# Patient Record
Sex: Female | Born: 1950
Health system: Southern US, Community
[De-identification: ages and names within clinical notes are randomized; demographics above are authoritative.]

## PROBLEM LIST (undated history)

## (undated) DIAGNOSIS — K08109 Complete loss of teeth, unspecified cause, unspecified class: Secondary | ICD-10-CM

## (undated) DIAGNOSIS — D696 Thrombocytopenia, unspecified: Secondary | ICD-10-CM

## (undated) DIAGNOSIS — R233 Spontaneous ecchymoses: Secondary | ICD-10-CM

## (undated) DIAGNOSIS — I10 Essential (primary) hypertension: Secondary | ICD-10-CM

## (undated) DIAGNOSIS — F32A Depression, unspecified: Secondary | ICD-10-CM

## (undated) DIAGNOSIS — N289 Disorder of kidney and ureter, unspecified: Secondary | ICD-10-CM

## (undated) DIAGNOSIS — F1020 Alcohol dependence, uncomplicated: Secondary | ICD-10-CM

## (undated) DIAGNOSIS — R609 Edema, unspecified: Secondary | ICD-10-CM

## (undated) DIAGNOSIS — R238 Other skin changes: Secondary | ICD-10-CM

## (undated) DIAGNOSIS — K7460 Unspecified cirrhosis of liver: Secondary | ICD-10-CM

## (undated) DIAGNOSIS — I499 Cardiac arrhythmia, unspecified: Secondary | ICD-10-CM

## (undated) DIAGNOSIS — F329 Major depressive disorder, single episode, unspecified: Secondary | ICD-10-CM

## (undated) DIAGNOSIS — K7010 Alcoholic hepatitis without ascites: Secondary | ICD-10-CM

## (undated) DIAGNOSIS — M1990 Unspecified osteoarthritis, unspecified site: Secondary | ICD-10-CM

## (undated) DIAGNOSIS — K766 Portal hypertension: Secondary | ICD-10-CM

## (undated) DIAGNOSIS — F419 Anxiety disorder, unspecified: Secondary | ICD-10-CM

## (undated) DIAGNOSIS — I509 Heart failure, unspecified: Secondary | ICD-10-CM

## (undated) HISTORY — PX: BREAST SURGERY: SHX581

## (undated) HISTORY — PX: ABDOMINAL HYSTERECTOMY: SHX81

## (undated) HISTORY — PX: KIDNEY STONE SURGERY: SHX686

---
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
## 2001-12-15 ENCOUNTER — Encounter: Payer: Self-pay | Admitting: Emergency Medicine

## 2001-12-15 ENCOUNTER — Inpatient Hospital Stay (HOSPITAL_COMMUNITY): Admission: EM | Admit: 2001-12-15 | Discharge: 2001-12-18 | Payer: Self-pay | Admitting: Emergency Medicine

## 2001-12-15 IMAGING — CT CT ABDOMEN W/O CM
1 series · 15 of 32 positions shown, 19 images · non-contrast
Comparison: none

FINDINGS
CLINICAL DATA: 51-YEAR-OLD WITH LEFT FLANK PAIN AND HEMATURIA.
CT ABDOMEN WITHOUT CONTRAST
HELICAL CT EXAMINATION OF THE ABDOMEN AND PELVIS PERFORMED WITHOUT IV OR ORAL CONTRAST TO
SPECIFICALLY EVALUATE FOR RENAL/URETERAL CALCULI /OBSTRUCTION.
THE LUNG BASES ARE CLEAR.
THE VISUALIZED UNENHANCED APPEARANCE OF THE LIVER AND SPLEEN ARE UNREMARKABLE.  THE PANCREAS
APPEARS NORMAL.  SMALL BOWEL AND COLON ARE GROSSLY NORMAL WITHOUT CONTRAST.  NO MESENTERIC
RETROPERITONEAL MASSES OR ADENOPATHY.  THE ADRENAL GLANDS ARE NORMAL.  THE RIGHT KIDNEY
DEMONSTRATES TWO RENAL CALCULI BUT NO EVIDENCE FOR AN OBSTRUCTIVE PROCESS.  THE LEFT KIDNEY
DEMONSTRATES HYDRONEPHROSIS AND PERINEPHRIC SIGNS OF OBSTRUCTION.  THERE IS ALSO MILD DILATATION OF
THE LEFT URETER BUT NO UPPER URETERAL CALCULI ARE SEEN.
THE AORTA IS NORMAL IN CALIBER.
THE GALLBLADDER APPEARS NORMAL.
IMPRESSION
1.  RIGHT RENAL CALCULI.
2. OBSTRUCTIVE PROCESS  LEFT KIDNEY WITH HYDROURETERONEPHROSIS.
CT PELVIS WITHOUT CONTRAST
THERE IS A 4.25 MM LEFT DISTAL URETERAL CALCULUS CAUSING THE LEFT-SIDED HYDROURETERONEPHROSIS.  THE
PATIENT HAS HAD A HYSTERECTOMY.  BOTH OVARIES ARE STILL PRESENT AND ARE UNREMARKABLE BY CT.  THERE
IS A FOLEY CATHETER IN THE BLADDER.  NO DEFINITE BLADDER CALCULI ARE SEEN.
NO PELVIC MASSES, ADENOPATHY OR FREE PELVIC FLUID COLLECTIONS.
4.25 MM LEFT DISTAL URETERAL CALCULUS CAUSING MODERATE GRADE OBSTRUCTION.

[Series 8022: — · axial · 0.76mm/px · z∈[+1346,+1666]mm · 15 of 72 slices shown, 19 images]
[im 5/72  soft-tissue]
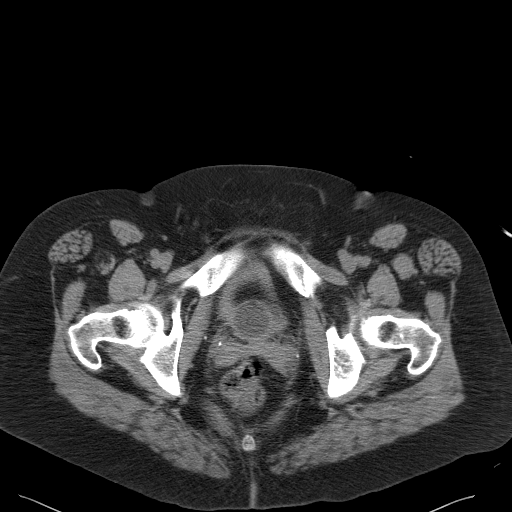
[im 5/72  bone]
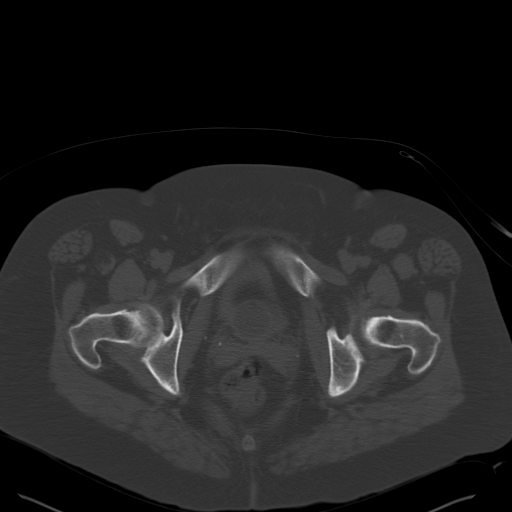
[im 10/72  soft-tissue]
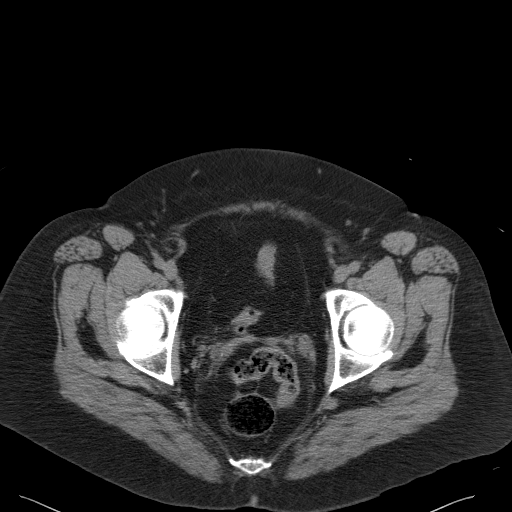
[im 14/72  soft-tissue]
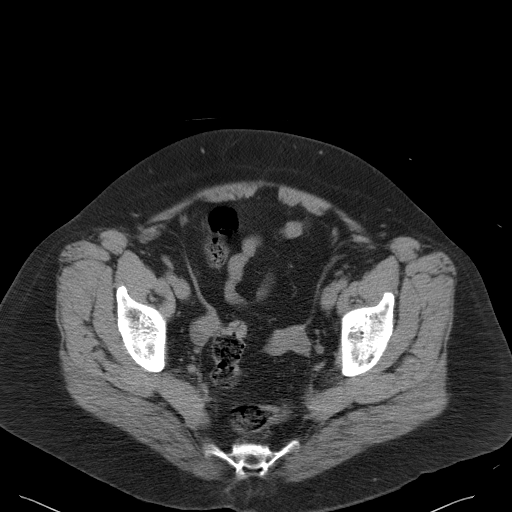
[im 21/72  soft-tissue]
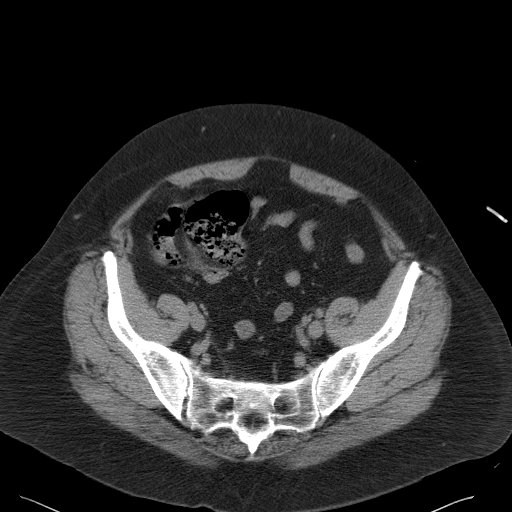
[im 26/72  soft-tissue]
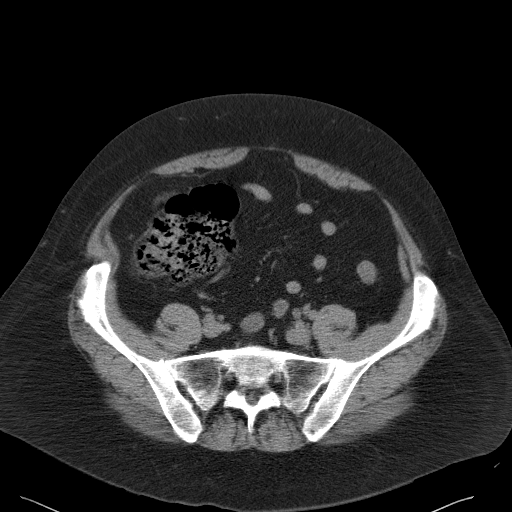
[im 30/72  soft-tissue]
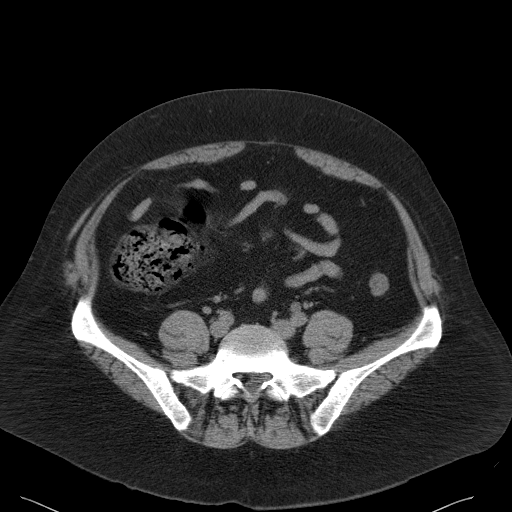
[im 37/72  soft-tissue]
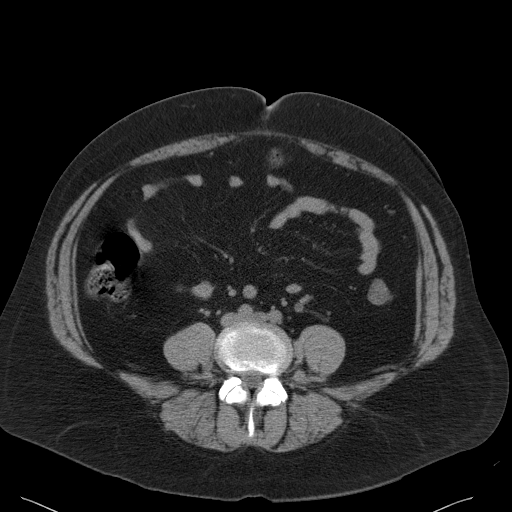
[im 42/72  soft-tissue]
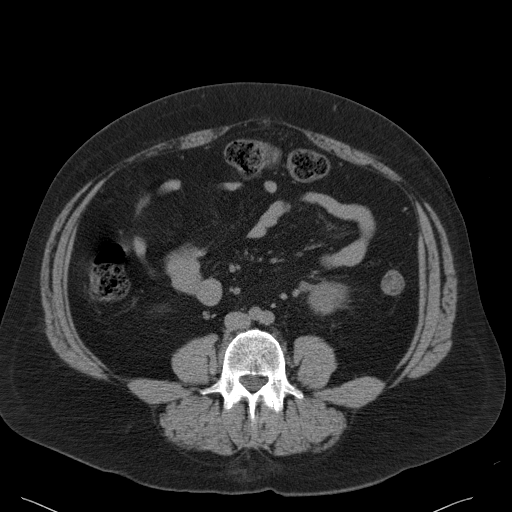
[im 46/72  soft-tissue]
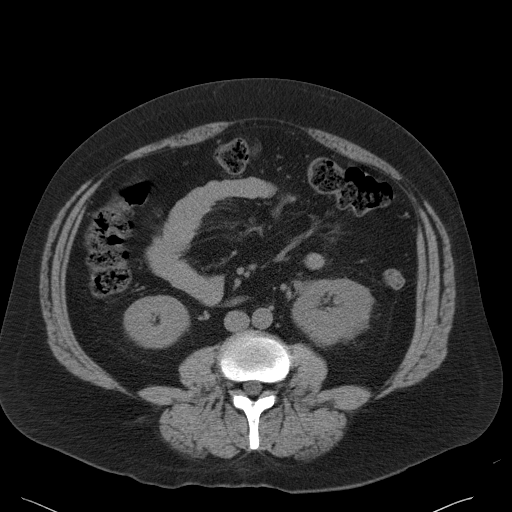
[im 46/72  bone]
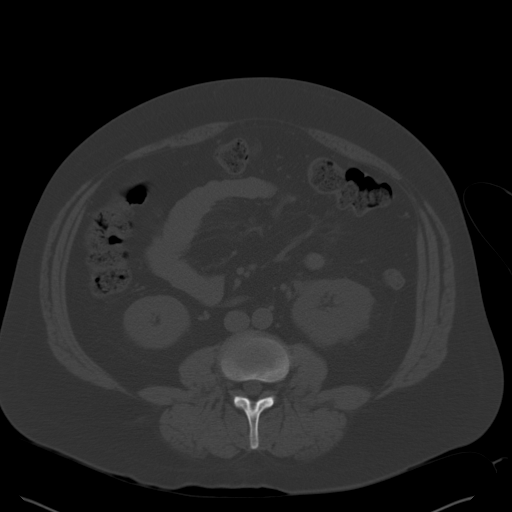
[im 51/72  soft-tissue]
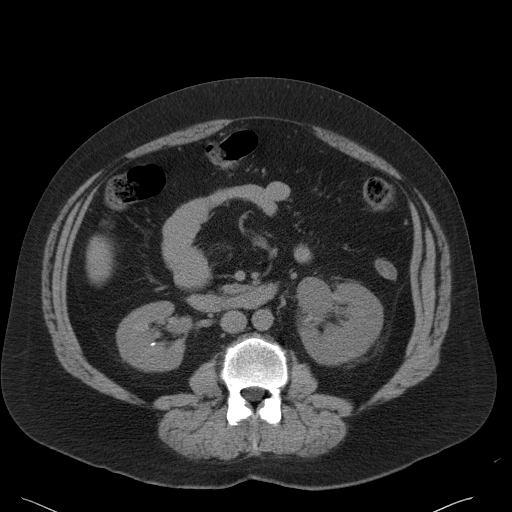
[im 58/72  soft-tissue]
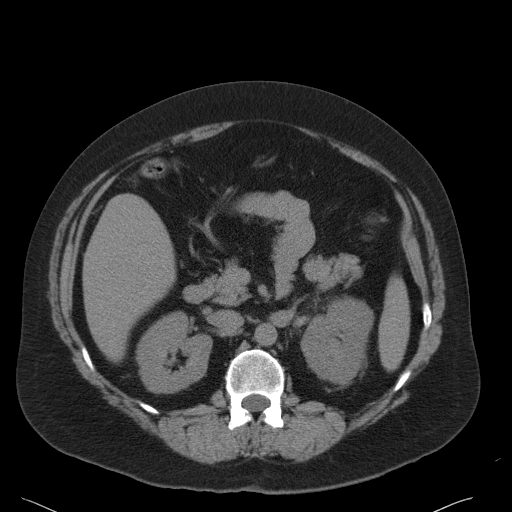
[im 62/72  soft-tissue]
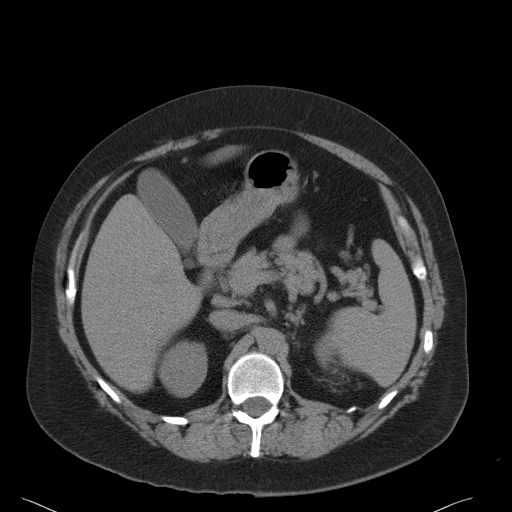
[im 62/72  lung]
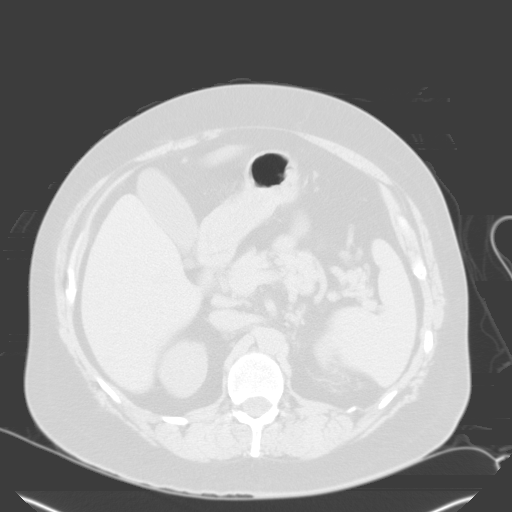
[im 65/72  lung]
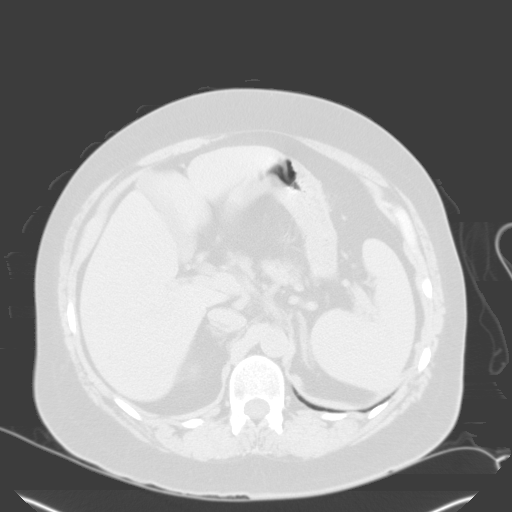
[im 67/72  soft-tissue]
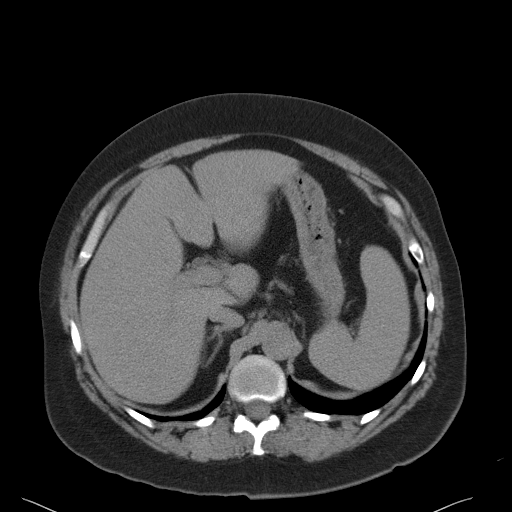
[im 67/72  lung]
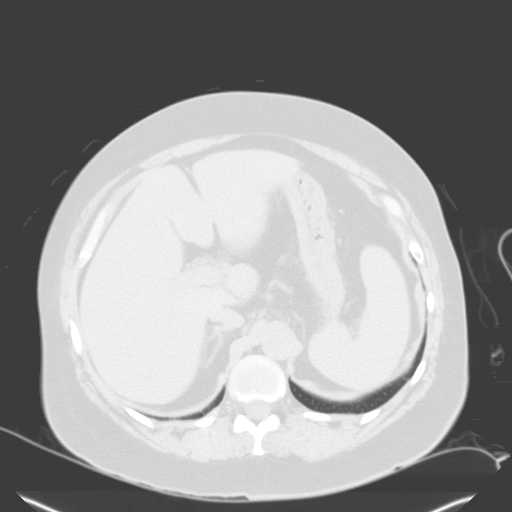
[im 69/72  lung]
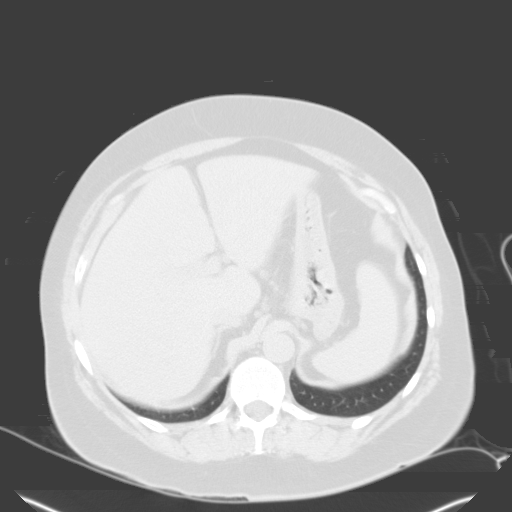

[15 of 32 positions shown; findings below may reference images not displayed]

## 2001-12-18 ENCOUNTER — Encounter: Payer: Self-pay | Admitting: Urology

## 2001-12-20 ENCOUNTER — Emergency Department (HOSPITAL_COMMUNITY): Admission: EM | Admit: 2001-12-20 | Discharge: 2001-12-20 | Payer: Self-pay | Admitting: Emergency Medicine

## 2009-10-26 ENCOUNTER — Emergency Department (HOSPITAL_COMMUNITY): Admission: EM | Admit: 2009-10-26 | Discharge: 2009-10-26 | Payer: Self-pay | Admitting: Emergency Medicine

## 2009-10-26 IMAGING — CR DG WRIST COMPLETE 3+V*R*
4 series · 4 of 4 positions shown · non-contrast
Comparison: None.

CLINICAL DATA: Fall.  Wrist injury.  Pain and bruising.

RIGHT WRIST - COMPLETE 3+ VIEW

[view not recorded (1 of 4)]
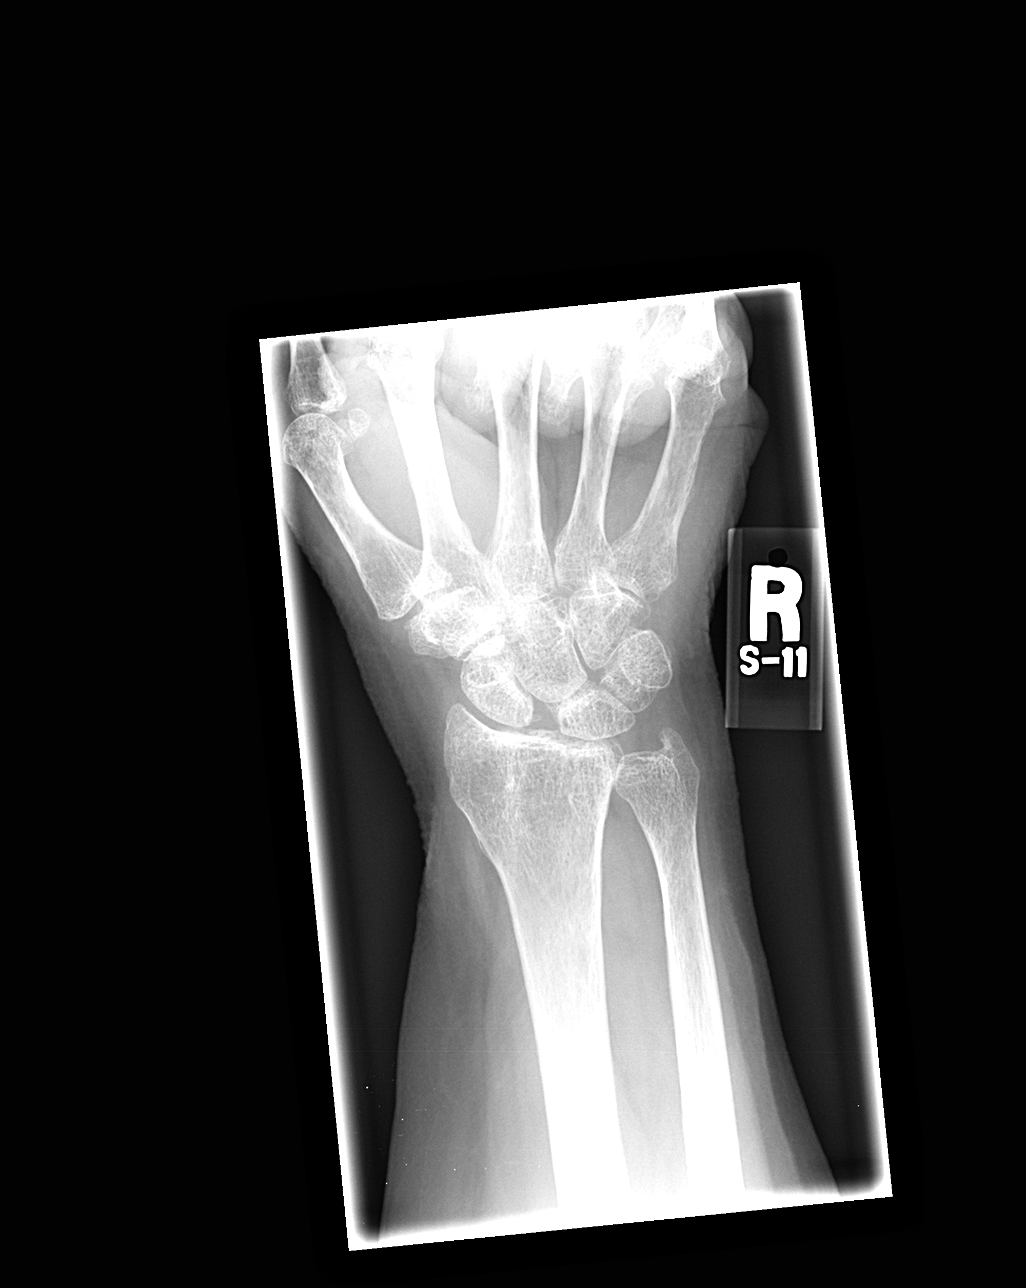

[view not recorded (2 of 4)]
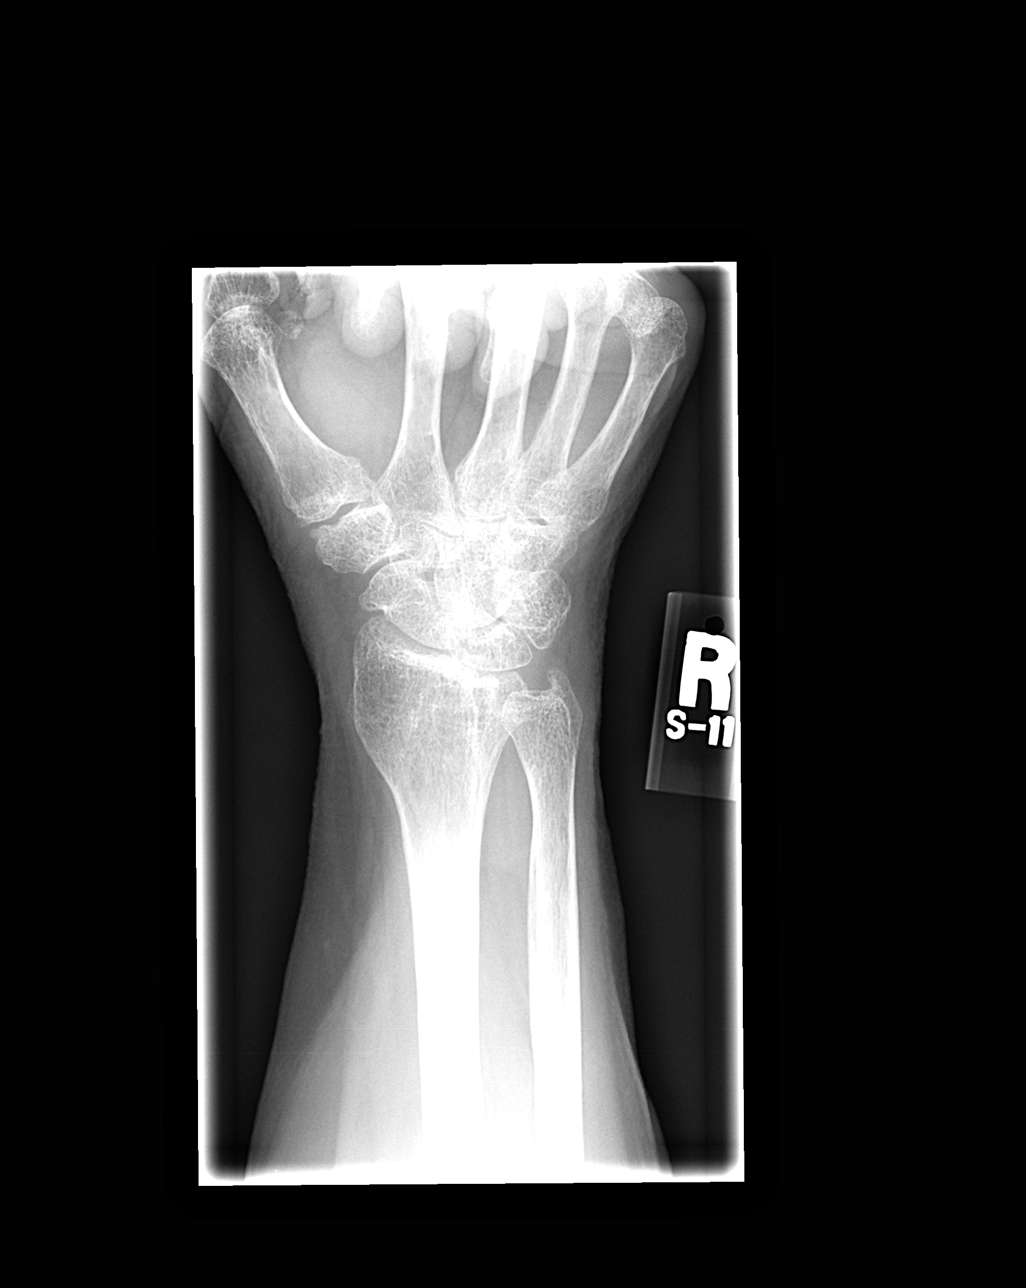

[view not recorded (3 of 4)]
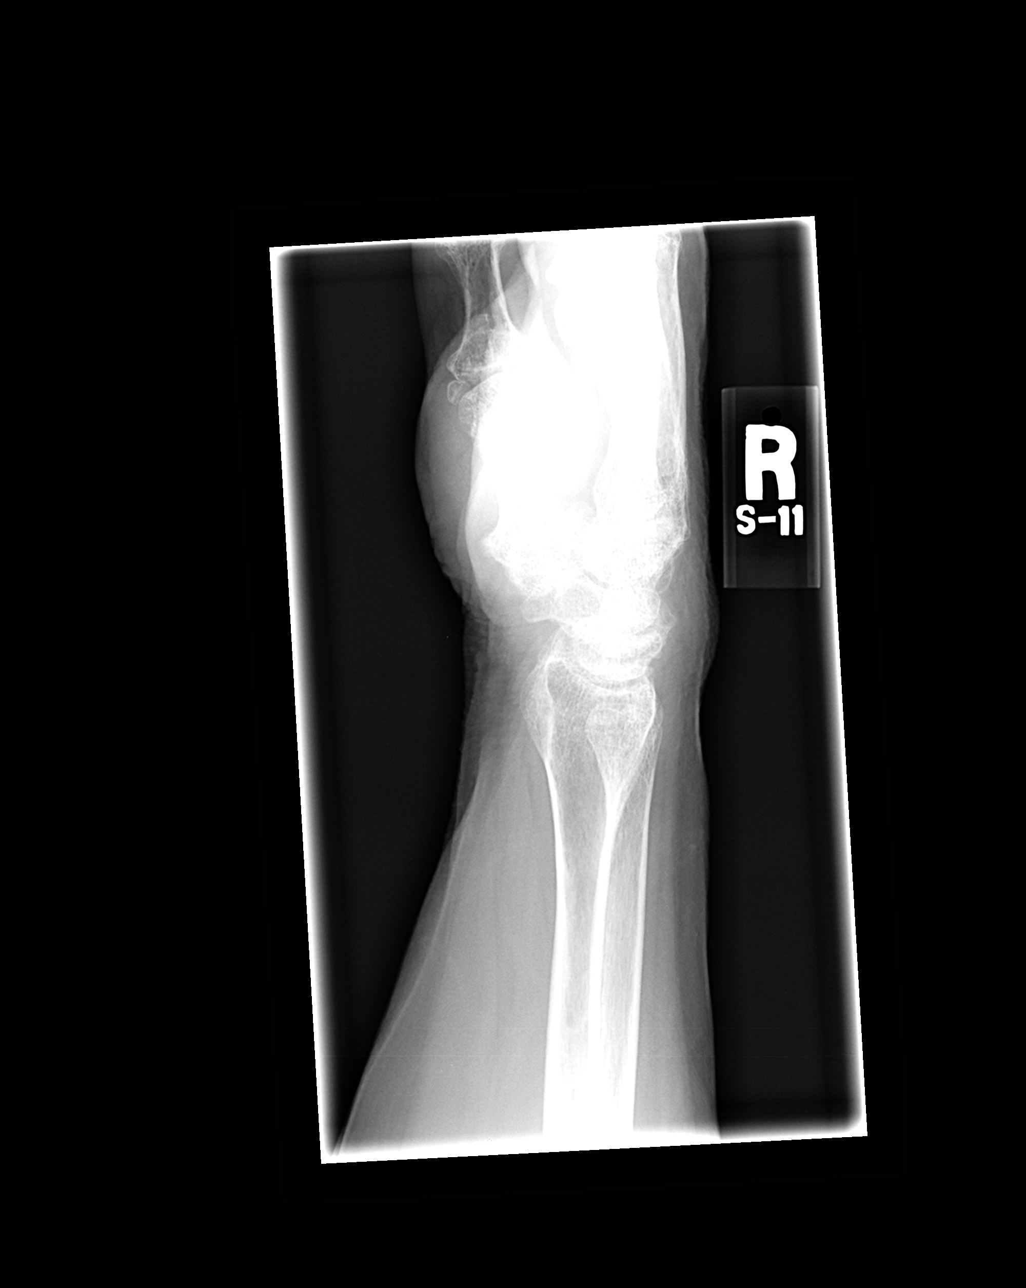

[view not recorded (4 of 4)]
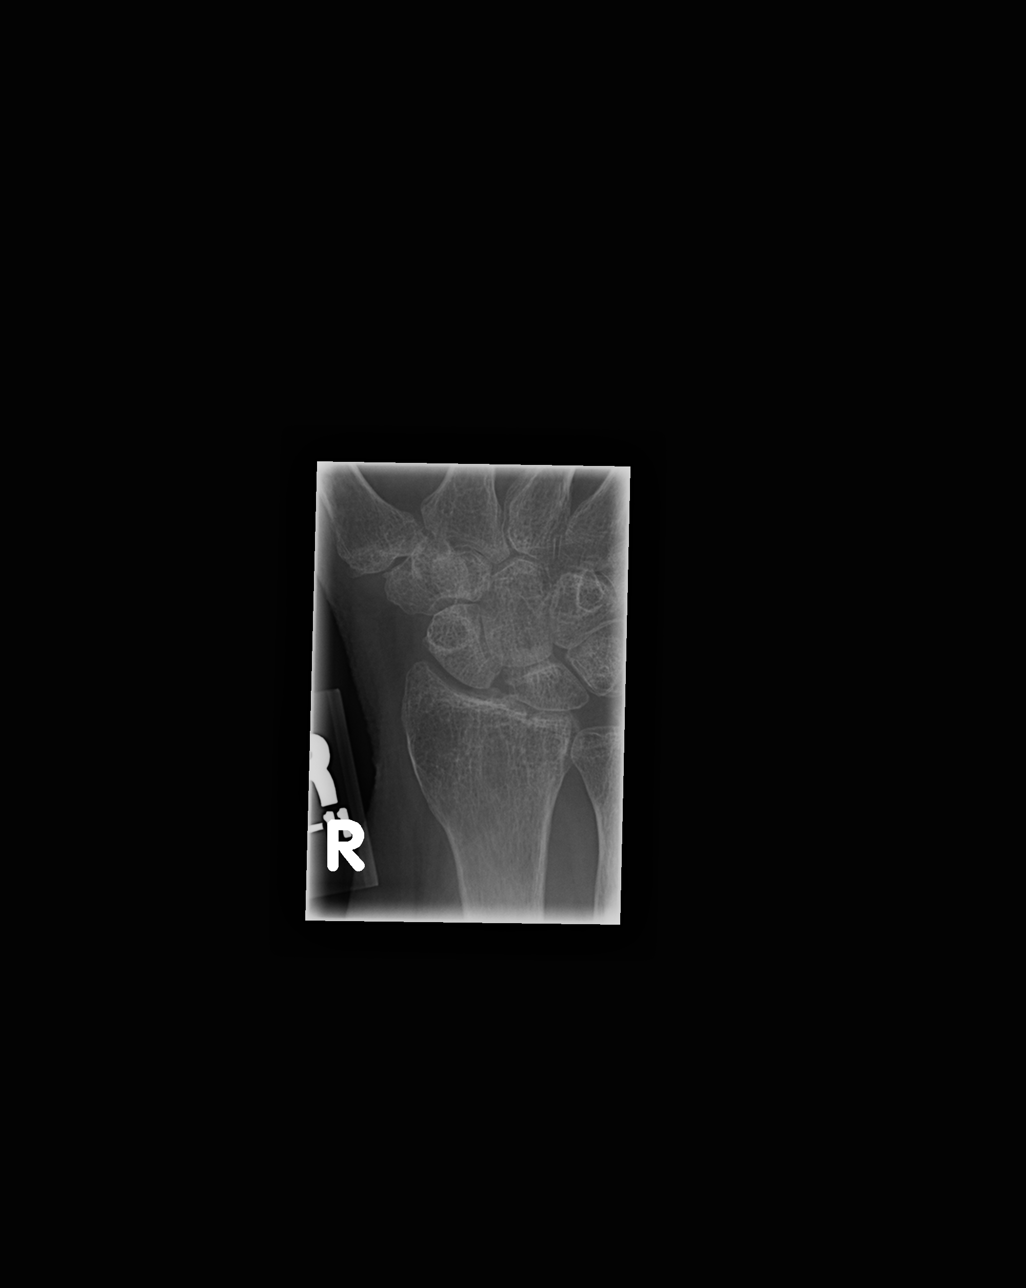

[4 of 4 positions shown; findings below may reference images not displayed]

FINDINGS: No evidence of fracture or dislocation.  Generalized
osteopenia noted.  Widening of the scapholunate joint space seen,
consistent with scapholunate ligament tear, which is of
indeterminate age.
IMPRESSION: 1.  No evidence of fracture or dislocation.
2.  Scapholunate ligament tear, which is of indeterminate age.
3.  Diffuse osteopenia.

## 2010-11-02 NOTE — H&P (Signed)
[facility]  Patient:    Alyssa Pena, Alyssa Pena Visit Number: [id number] MRN: [id number]          Service Type: MED Location: 3A B284 01 Attending Physician:  Annamarie Dawley Dictated by:   Alleen Borne, M.D. Admit Date:  12/15/2001                           History and Physical  CHIEF COMPLAINT:  Difficulty to void, left flank pain.  HISTORY OF PRESENT ILLNESS:  This is a 60 year old female who presented to the emergency room with severe pain on the left side radiating downwards and forward, and then having difficulty to void.  Foley catheter was inserted.  A CT scan was done.  It showed that she had a 2 x 4 mm stone in the left ureterovesical junction.  The patient was still having considerable pain, so it was decided to admit her into the hospital.  She was also having nausea and vomiting.  No fever, chills, or having similar problems in the past.  PAST MEDICAL HISTORY:  History of having bronchial asthma.  No diabetes or hypertension.  FAMILY HISTORY:  No history of kidney stones.  SOCIAL HISTORY:  Does not smoke or drink.  REVIEW OF SYSTEMS:  Unremarkable.  PHYSICAL EXAMINATION:  GENERAL:  Morbidly obese female not in acute distress.  Fully conscious, alert and oriented.  VITAL SIGNS:  Blood pressure is 140/90, temperature is normal.  CENTRAL NERVOUS SYSTEM:  No gross neurological deficit.  HEAD AND NECK:  ENT negative.  CHEST:  Symmetrical.  HEART:  Regular sinus rhythm.  ABDOMEN:  Soft and flat.  Liver, spleen, and kidneys are not palpable.  Mild tenderness, left severe.  PELVIC EXAMINATION:  Deferred.  EXTREMITIES:  Normal.  IMPRESSION:  Left ureteral calculus.  PLAN:  IV fluids, parenteral analgesia. Dictated by:   Alleen Borne, M.D. Attending Physician:  Annamarie Dawley DD:  12/15/01 TD:  12/17/01 Job: [id number]

## 2010-11-02 NOTE — Op Note (Signed)
   NAME:  Alyssa Pena, Alyssa Pena                           ACCOUNT NO.:  [id number]   MEDICAL RECORD NO.:  [id number]                   PATIENT TYPE:  INP   LOCATION:  A337                                 FACILITY:  [facility]   PHYSICIAN:  Ky Barban, M.D.            DATE OF BIRTH:  [birth year]   DATE OF PROCEDURE:  12/18/2001  DATE OF DISCHARGE:  12/18/2001                                 OPERATIVE REPORT   PREOPERATIVE DIAGNOSIS:  Left ureteral calculus.   POSTOPERATIVE DIAGNOSIS:  Left ureteral calculus.   PROCEDURE:  1. Cystoscopy.  2. Left retrograde pyelogram.  3. Balloon dilation of ureters.  4. Stone basket extraction.   SURGEON:  Ky Barban, M.D.   ANESTHESIA:  General   DESCRIPTION OF PROCEDURE:  The patient was given general endotracheal  anesthesia and placed in the lithotomy position after sterile prep and  drape.  A #25 cystoscope was introduced into the bladder.  The left ureteral  orifice catheterized with a wedge catheter.  Hypaque is injected and a stone  is seen in the left ureterovesical junction.   At this point a guidewire is passed up into the renal pelvis.  Over the  guidewire a #15 balloon dilator is positioned in the intramural ureter.  The  ureter is dilated, now the balloon is removed along with the cystoscope  leaving the guidewire in.  Along side the guidewire a short rigid  ureteroscope is passed to the level of the stone.  The stone is visualized  and it was engaged in the basket, and removed under direct vision without  any complications.   The guidewire and instruments were removed.  No complications.  The patient  left the operating room in satisfactory condition.                                               Ky Barban, M.D.    MIJ/MEDQ  D:  02/20/2002  T:  02/20/2002  Job:  [id number]

## 2010-11-02 NOTE — Discharge Summary (Signed)
   NAMEMARIELYS, TRINIDAD                             ACCOUNT NO.:  [id number]   MEDICAL RECORD NO.:  [id number]                  PATIENT TYPE:   LOCATION:                                       FACILITY:   PHYSICIAN:  Ky Barban, M.D.            DATE OF BIRTH:   DATE OF ADMISSION:  12/15/2001  DATE OF DISCHARGE:  12/18/2001                                 DISCHARGE SUMMARY   HISTORY OF PRESENT ILLNESS:  A 60 year old female was admitted with left  renal colic.  CT scan showed a stone in the left ureter in the  ureterovesical junction.  The stone is about 5 mm in size.  She is admitted  to the hospital for further management and work-up.   HOSPITAL COURSE:  On routine admission work-up CBC, urinalysis and Chem-7  done, which are normal.  She was started on IV fluids and parenteral  analgesia.  She continued to complain of pain and wanted me to go ahead and  remove the stone so I advised her to undergo stone basket procedure.  Its  complications were discussed.  She was taken to the operating room on July 4  and the stone basket was done.  Stone was removed.  No complications so the  same evening the patient is feeling well so I discharged her home.  Will  follow her in the office.   FINAL DISCHARGE DIAGNOSES:  Left ureter calculus.   CONDITION ON DISCHARGE:  Improved.   DISCHARGE MEDICATIONS:  None.                                               Ky Barban, M.D.    MIJ/MEDQ  D:  02/20/2002  T:  02/22/2002  Job:  [id number]

## 2012-06-06 ENCOUNTER — Emergency Department (HOSPITAL_COMMUNITY)
Admission: EM | Admit: 2012-06-06 | Discharge: 2012-06-06 | Disposition: A | Discharge status: Home / Self Care | Payer: Self-pay | Attending: Emergency Medicine | Admitting: Emergency Medicine

## 2012-06-06 ENCOUNTER — Encounter (HOSPITAL_COMMUNITY): Payer: Self-pay | Admitting: *Deleted

## 2012-06-06 DIAGNOSIS — Z862 Personal history of diseases of the blood and blood-forming organs and certain disorders involving the immune mechanism: Secondary | ICD-10-CM | POA: Insufficient documentation

## 2012-06-06 DIAGNOSIS — I10 Essential (primary) hypertension: Secondary | ICD-10-CM | POA: Insufficient documentation

## 2012-06-06 DIAGNOSIS — L299 Pruritus, unspecified: Secondary | ICD-10-CM | POA: Insufficient documentation

## 2012-06-06 DIAGNOSIS — Z8639 Personal history of other endocrine, nutritional and metabolic disease: Secondary | ICD-10-CM | POA: Insufficient documentation

## 2012-06-06 DIAGNOSIS — R52 Pain, unspecified: Secondary | ICD-10-CM | POA: Insufficient documentation

## 2012-06-06 DIAGNOSIS — L259 Unspecified contact dermatitis, unspecified cause: Secondary | ICD-10-CM | POA: Insufficient documentation

## 2012-06-06 DIAGNOSIS — L309 Dermatitis, unspecified: Secondary | ICD-10-CM

## 2012-06-06 DIAGNOSIS — Z79899 Other long term (current) drug therapy: Secondary | ICD-10-CM | POA: Insufficient documentation

## 2012-06-06 DIAGNOSIS — Z87891 Personal history of nicotine dependence: Secondary | ICD-10-CM | POA: Insufficient documentation

## 2012-06-06 HISTORY — DX: Edema, unspecified: R60.9

## 2012-06-06 HISTORY — DX: Essential (primary) hypertension: I10

## 2012-06-06 MED ORDER — HYDROXYZINE HCL 25 MG PO TABS: 25.0000 mg | ORAL_TABLET | Freq: Four times a day (QID) | ORAL | Status: DC

## 2012-06-06 MED ORDER — PREDNISONE 10 MG PO TABS: ORAL_TABLET | ORAL | Status: DC

## 2012-06-06 MED ORDER — TRIAMCINOLONE ACETONIDE 0.1 % EX CREA: TOPICAL_CREAM | Freq: Two times a day (BID) | CUTANEOUS | Status: DC

## 2012-06-06 NOTE — ED Notes (Signed)
Rash to bil lower extremities since 05/14/12.  Reports using OTC meds with no relief.  C/o itching/burning/pain.

## 2012-06-06 NOTE — ED Provider Notes (Signed)
History     CSN: [id number]  Arrival date & time 06/06/12  1117   First MD Initiated Contact with Patient 06/06/12 1220      Chief Complaint  Patient presents with  . Rash    (Consider location/radiation/quality/duration/timing/severity/associated sxs/prior treatment) Patient is a 61 y.o. female presenting with rash. The history is provided by the patient.  Rash  This is a recurrent problem. The current episode started more than 1 week ago. The problem has not changed since onset.The problem is associated with nothing. There has been no fever. The rash is present on the left lower leg and right lower leg. The pain is moderate. The pain has been intermittent since onset. Associated symptoms include itching and pain. She has tried anti-itch cream for the symptoms. The treatment provided no relief.    Past Medical History  Diagnosis Date  . Hypertension   . Fluid retention     Past Surgical History  Procedure Date  . Abdominal hysterectomy   . Kidney stone surgery     No family history on file.  History  Substance Use Topics  . Smoking status: Former Games developer  . Smokeless tobacco: Not on file  . Alcohol Use: 1.2 oz/week    2 Cans of beer per week     Comment: daily    OB History    Grav Para Term Preterm Abortions TAB SAB Ect Mult Living                  Review of Systems  Constitutional: Negative for activity change.       All ROS Neg except as noted in HPI  HENT: Negative for nosebleeds and neck pain.   Eyes: Negative for photophobia and discharge.  Respiratory: Negative for cough, shortness of breath and wheezing.   Cardiovascular: Negative for chest pain and palpitations.  Gastrointestinal: Negative for abdominal pain and blood in stool.  Genitourinary: Negative for dysuria, frequency and hematuria.  Musculoskeletal: Negative for back pain and arthralgias.  Skin: Positive for itching and rash.  Neurological: Negative for dizziness, seizures and speech  difficulty.  Psychiatric/Behavioral: Negative for hallucinations and confusion.    Allergies  Penicillins  Home Medications   Current Outpatient Rx  Name  Route  Sig  Dispense  Refill  . FLUTICASONE PROPIONATE  HFA 110 MCG/ACT IN AERO   Inhalation   Inhale 1 puff into the lungs 2 (two) times daily as needed.         Marland Kitchen FOLIC ACID 400 MCG PO TABS   Oral   Take 400 mcg by mouth daily.         . FUROSEMIDE 40 MG PO TABS   Oral   Take 40 mg by mouth daily.         Marland Kitchen LISINOPRIL 20 MG PO TABS   Oral   Take 20 mg by mouth daily.         Marland Kitchen LIQUID TEARS OP   Both Eyes   Place 1 drop into both eyes daily.           There were no vitals taken for this visit.  Physical Exam  Nursing note and vitals reviewed. Constitutional: She is oriented to person, place, and time. She appears well-developed and well-nourished.  Non-toxic appearance.  HENT:  Head: Normocephalic.  Right Ear: Tympanic membrane and external ear normal.  Left Ear: Tympanic membrane and external ear normal.  Eyes: EOM and lids are normal. Pupils are equal, round, and reactive  to light.  Neck: Normal range of motion. Neck supple. Carotid bruit is not present.  Cardiovascular: Normal rate, regular rhythm, normal heart sounds, intact distal pulses and normal pulses.   Pulmonary/Chest: Breath sounds normal. No respiratory distress.  Abdominal: Soft. Bowel sounds are normal. There is no tenderness. There is no guarding.  Musculoskeletal: Normal range of motion.       See skin exam. Mild to mod edema of the lower extremities.  Lymphadenopathy:       Head (right side): No submandibular adenopathy present.       Head (left side): No submandibular adenopathy present.    She has no cervical adenopathy.  Neurological: She is alert and oriented to person, place, and time. She has normal strength. No cranial nerve deficit or sensory deficit.  Skin: Skin is warm and dry.       Dry scaling rash at the right and  left antecubital area, as well as right and left lower extremities. No hot area. No red streaking.  Psychiatric: She has a normal mood and affect. Her speech is normal.    ED Course  Procedures (including critical care time)  Labs Reviewed - No data to display No results found.   No diagnosis found.    MDM  I have reviewed nursing notes, vital signs, and all appropriate lab and imaging results for this patient. Exam is consistent with eczema. Pt to be treated with triamcinolone, vistaril, and prednisone taper. Pt to see Dr Hall(dermatologist), if not improving.       Kathie Dike, [place] 06/06/12 [phone]

## 2012-06-06 NOTE — ED Provider Notes (Signed)
Medical screening examination/treatment/procedure(s) were performed by non-physician practitioner and as supervising physician I was immediately available for consultation/collaboration.  Massiel Stipp, MD 06/06/12 1517 

## 2013-08-10 ENCOUNTER — Observation Stay (HOSPITAL_COMMUNITY)
Admission: EM | Admit: 2013-08-10 | Discharge: 2013-08-12 | Disposition: A | Discharge status: Home / Self Care | Payer: Self-pay | Attending: Internal Medicine | Admitting: Internal Medicine

## 2013-08-10 ENCOUNTER — Emergency Department (HOSPITAL_COMMUNITY): Payer: Self-pay

## 2013-08-10 ENCOUNTER — Encounter (HOSPITAL_COMMUNITY): Payer: Self-pay | Admitting: Emergency Medicine

## 2013-08-10 DIAGNOSIS — R7689 Other specified abnormal immunological findings in serum: Secondary | ICD-10-CM | POA: Diagnosis present

## 2013-08-10 DIAGNOSIS — I5030 Unspecified diastolic (congestive) heart failure: Secondary | ICD-10-CM | POA: Diagnosis present

## 2013-08-10 DIAGNOSIS — E876 Hypokalemia: Secondary | ICD-10-CM | POA: Diagnosis present

## 2013-08-10 DIAGNOSIS — Z87891 Personal history of nicotine dependence: Secondary | ICD-10-CM | POA: Insufficient documentation

## 2013-08-10 DIAGNOSIS — J029 Acute pharyngitis, unspecified: Secondary | ICD-10-CM | POA: Insufficient documentation

## 2013-08-10 DIAGNOSIS — I10 Essential (primary) hypertension: Secondary | ICD-10-CM | POA: Insufficient documentation

## 2013-08-10 DIAGNOSIS — R768 Other specified abnormal immunological findings in serum: Secondary | ICD-10-CM

## 2013-08-10 DIAGNOSIS — I272 Pulmonary hypertension, unspecified: Secondary | ICD-10-CM | POA: Diagnosis present

## 2013-08-10 DIAGNOSIS — E8779 Other fluid overload: Secondary | ICD-10-CM | POA: Insufficient documentation

## 2013-08-10 DIAGNOSIS — K7460 Unspecified cirrhosis of liver: Secondary | ICD-10-CM | POA: Diagnosis present

## 2013-08-10 DIAGNOSIS — D696 Thrombocytopenia, unspecified: Secondary | ICD-10-CM | POA: Diagnosis present

## 2013-08-10 DIAGNOSIS — K7010 Alcoholic hepatitis without ascites: Principal | ICD-10-CM | POA: Diagnosis present

## 2013-08-10 DIAGNOSIS — K7290 Hepatic failure, unspecified without coma: Secondary | ICD-10-CM

## 2013-08-10 DIAGNOSIS — K766 Portal hypertension: Secondary | ICD-10-CM | POA: Diagnosis present

## 2013-08-10 DIAGNOSIS — I2789 Other specified pulmonary heart diseases: Secondary | ICD-10-CM | POA: Insufficient documentation

## 2013-08-10 DIAGNOSIS — K7689 Other specified diseases of liver: Secondary | ICD-10-CM | POA: Insufficient documentation

## 2013-08-10 DIAGNOSIS — F1990 Other psychoactive substance use, unspecified, uncomplicated: Secondary | ICD-10-CM | POA: Diagnosis present

## 2013-08-10 DIAGNOSIS — R0602 Shortness of breath: Secondary | ICD-10-CM | POA: Insufficient documentation

## 2013-08-10 DIAGNOSIS — Z880 Allergy status to penicillin: Secondary | ICD-10-CM | POA: Insufficient documentation

## 2013-08-10 DIAGNOSIS — F1020 Alcohol dependence, uncomplicated: Secondary | ICD-10-CM | POA: Diagnosis present

## 2013-08-10 HISTORY — DX: Alcohol dependence, uncomplicated: F10.20

## 2013-08-10 HISTORY — DX: Thrombocytopenia, unspecified: D69.6

## 2013-08-10 HISTORY — DX: Alcoholic hepatitis without ascites: K70.10

## 2013-08-10 HISTORY — DX: Unspecified cirrhosis of liver: K74.60

## 2013-08-10 HISTORY — DX: Portal hypertension: K76.6

## 2013-08-10 LAB — CBC WITH DIFFERENTIAL/PLATELET
BASOS ABS: 0 10*3/uL (ref 0.0–0.1)
Basophils Relative: 1 % (ref 0–1)
EOS PCT: 2 % (ref 0–5)
Eosinophils Absolute: 0.1 10*3/uL (ref 0.0–0.7)
HCT: 39 % (ref 36.0–46.0)
Hemoglobin: 13.4 g/dL (ref 12.0–15.0)
LYMPHS ABS: 0.9 10*3/uL (ref 0.7–4.0)
Lymphocytes Relative: 22 % (ref 12–46)
MCH: 37.4 pg — AB (ref 26.0–34.0)
MCHC: 34.4 g/dL (ref 30.0–36.0)
MCV: 108.9 fL — AB (ref 78.0–100.0)
MONO ABS: 0.8 10*3/uL (ref 0.1–1.0)
Monocytes Relative: 19 % — ABNORMAL HIGH (ref 3–12)
NEUTROS PCT: 56 % (ref 43–77)
Neutro Abs: 2.5 10*3/uL (ref 1.7–7.7)
PLATELETS: 52 10*3/uL — AB (ref 150–400)
RBC: 3.58 MIL/uL — AB (ref 3.87–5.11)
RDW: 15.7 % — AB (ref 11.5–15.5)
WBC: 4.3 10*3/uL (ref 4.0–10.5)

## 2013-08-10 LAB — COMPREHENSIVE METABOLIC PANEL
ALBUMIN: 2.5 g/dL — AB (ref 3.5–5.2)
ALT: 39 U/L — ABNORMAL HIGH (ref 0–35)
AST: 89 U/L — ABNORMAL HIGH (ref 0–37)
Alkaline Phosphatase: 139 U/L — ABNORMAL HIGH (ref 39–117)
BUN: 6 mg/dL (ref 6–23)
CALCIUM: 8.4 mg/dL (ref 8.4–10.5)
CO2: 27 mEq/L (ref 19–32)
CREATININE: 0.55 mg/dL (ref 0.50–1.10)
Chloride: 102 mEq/L (ref 96–112)
GFR calc non Af Amer: >90 mL/min (ref >90)
GLUCOSE: 106 mg/dL — AB (ref 70–99)
Potassium: 3.6 mEq/L — ABNORMAL LOW (ref 3.7–5.3)
Sodium: 139 mEq/L (ref 137–147)
TOTAL PROTEIN: 8.6 g/dL — AB (ref 6.0–8.3)
Total Bilirubin: 5 mg/dL — ABNORMAL HIGH (ref 0.3–1.2)

## 2013-08-10 LAB — TROPONIN I

## 2013-08-10 LAB — LIPASE, BLOOD: LIPASE: 69 U/L — AB (ref 11–59)

## 2013-08-10 LAB — PROTIME-INR
INR: 1.43 (ref 0.00–1.49)
PROTHROMBIN TIME: 17.1 s — AB (ref 11.6–15.2)

## 2013-08-10 LAB — PRO B NATRIURETIC PEPTIDE: PRO B NATRI PEPTIDE: 121.1 pg/mL (ref 0–125)

## 2013-08-10 LAB — APTT: aPTT: 38 seconds — ABNORMAL HIGH (ref 24–37)

## 2013-08-10 IMAGING — CR DG CHEST 1V PORT
1 series · 1 of 1 positions shown · non-contrast
Comparison: None.

CLINICAL DATA: Cough.  Short of breath.

EXAM:
PORTABLE CHEST - 1 VIEW

[portable]
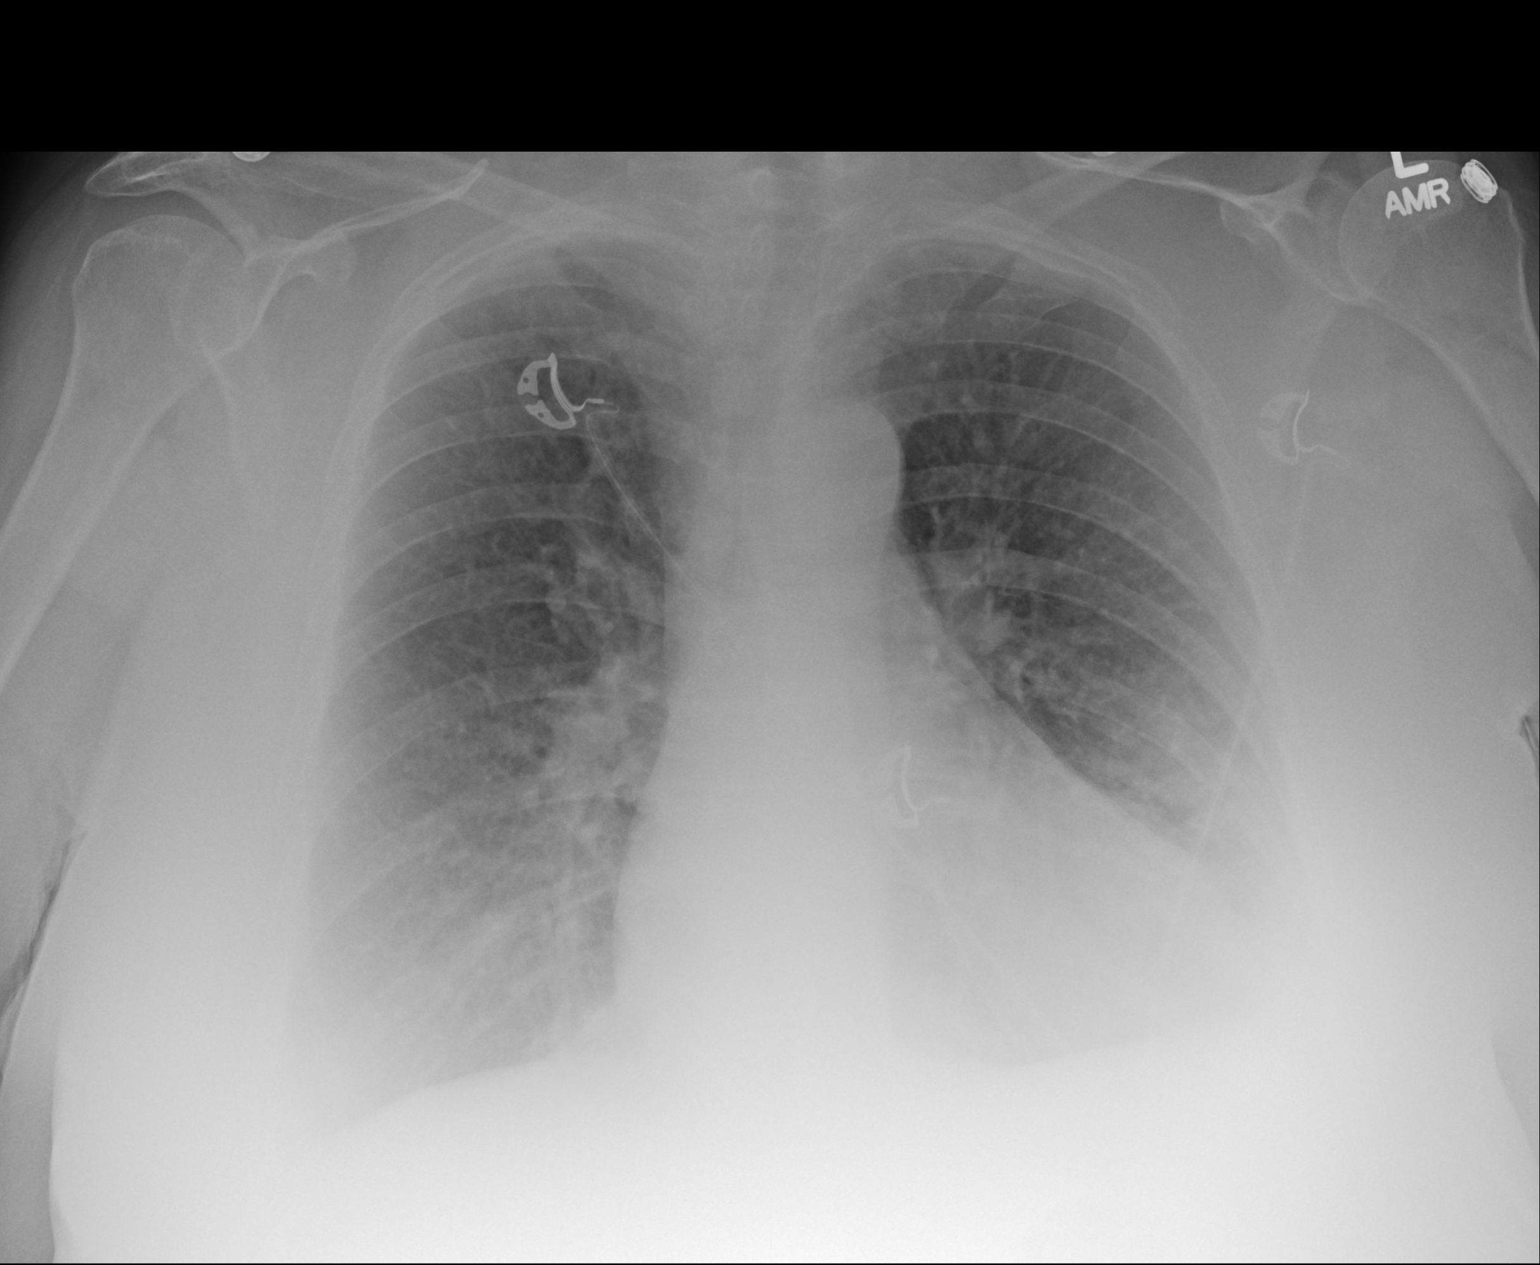

[1 of 1 positions shown; findings below may reference images not displayed]

FINDINGS: Cardiopericardial silhouette is within normal limits for projection.
There is pulmonary vascular congestion. No focal consolidation. Exam
is underpenetrated with opacity over both bases. Monitoring leads
project over the chest.
IMPRESSION: Pulmonary vascular congestion. Consider departmental PA and lateral
when patient condition permits.

## 2013-08-10 IMAGING — CT CT ABD-PELV W/ CM
2 of 5 series · 17 of 46 positions shown, 19 images · IV contrast (Omnipaque 300)
Comparison: CT ABDOMEN W/O CM dated [DATE]; DG CHEST 1V PORT
dated [DATE]

CLINICAL DATA: Shortness of breath. Leg swelling. Hysterectomy.
Abdominal pain.

EXAM:
CT ABDOMEN AND PELVIS WITH CONTRAST
TECHNIQUE: Multidetector CT imaging of the abdomen and pelvis was performed
using the standard protocol following bolus administration of
intravenous contrast.
CONTRAST:  50mL OMNIPAQUE IOHEXOL 300 MG/ML SOLN, 100mL OMNIPAQUE
IOHEXOL 300 MG/ML SOLN

[Series 2: abd_pel_with 5.0 b40f · axial · 0.88mm/px · z∈[-456,-60]mm · 14 of 91 slices shown, 16 images]
[im 6/91  soft-tissue]
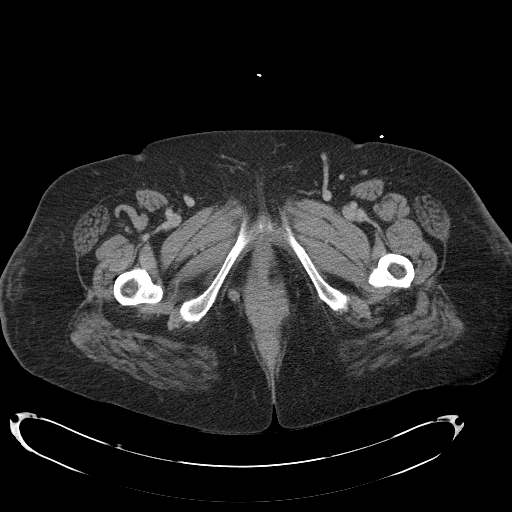
[im 6/91  bone]
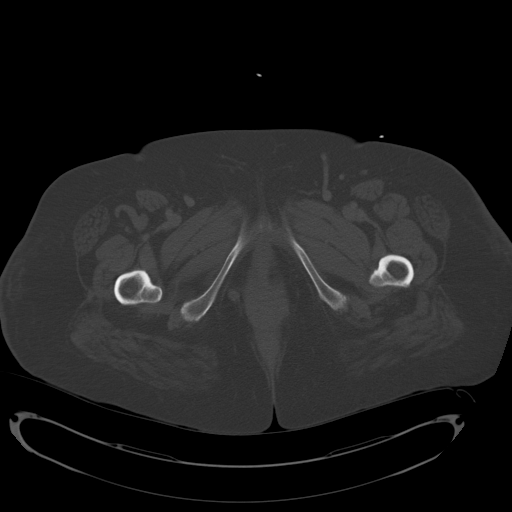
[im 11/91  soft-tissue]
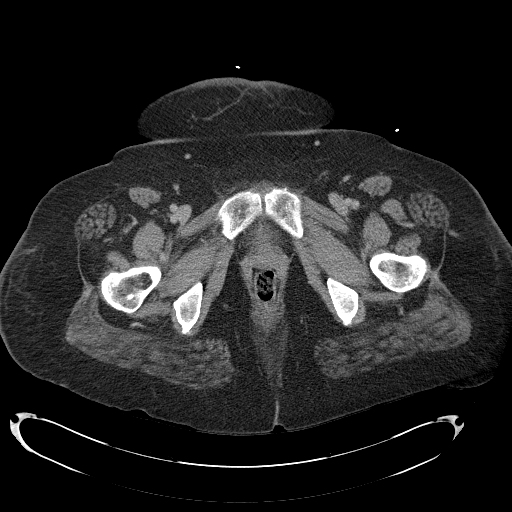
[im 16/91  soft-tissue]
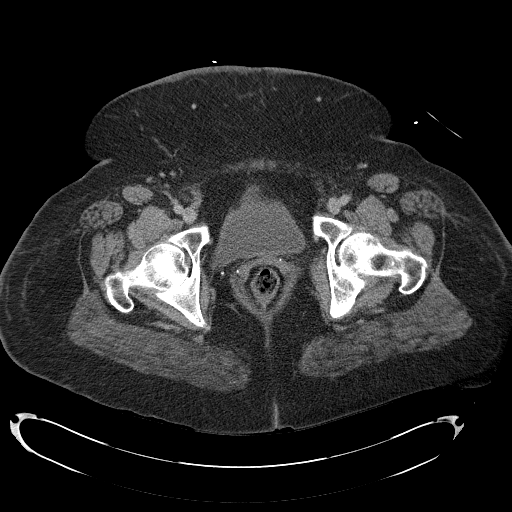
[im 27/91  soft-tissue]
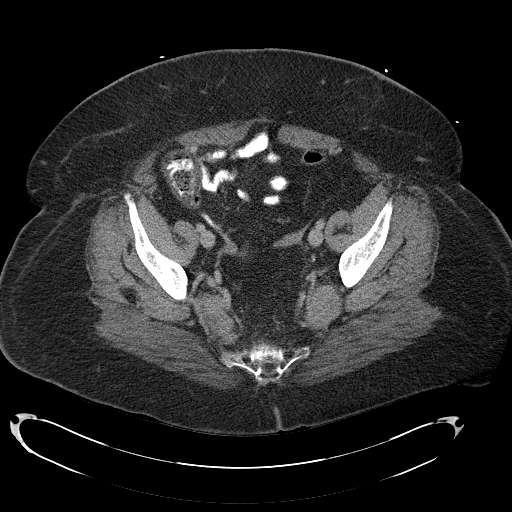
[im 32/91  soft-tissue]
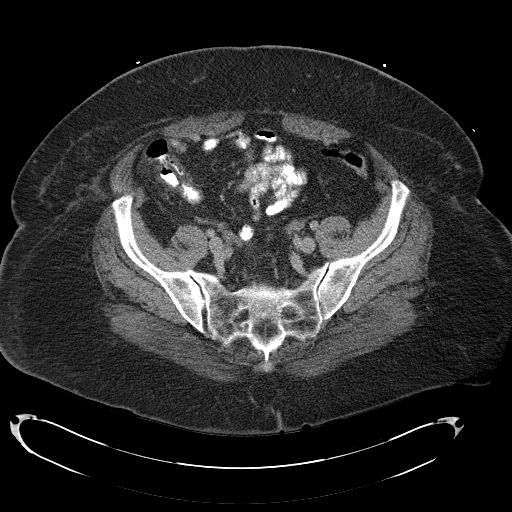
[im 38/91  soft-tissue]
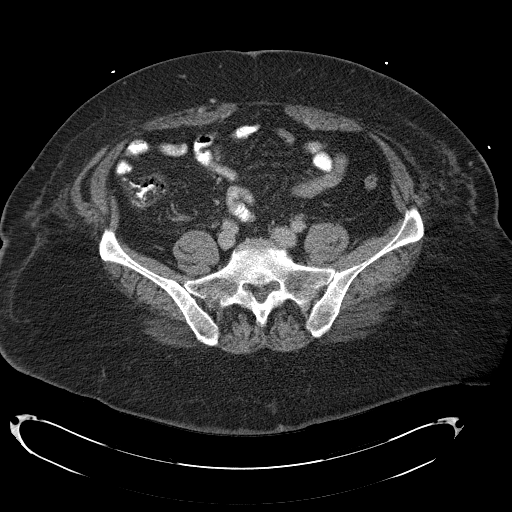
[im 43/91  soft-tissue]
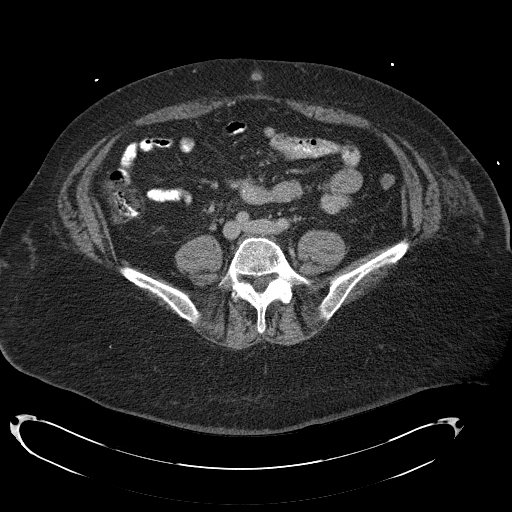
[im 48/91  soft-tissue]
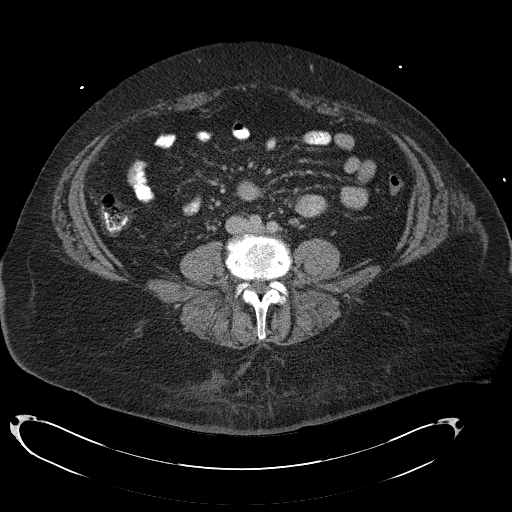
[im 53/91  soft-tissue]
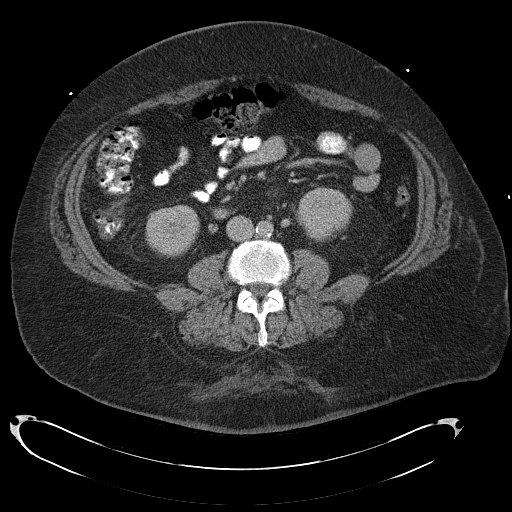
[im 53/91  bone]
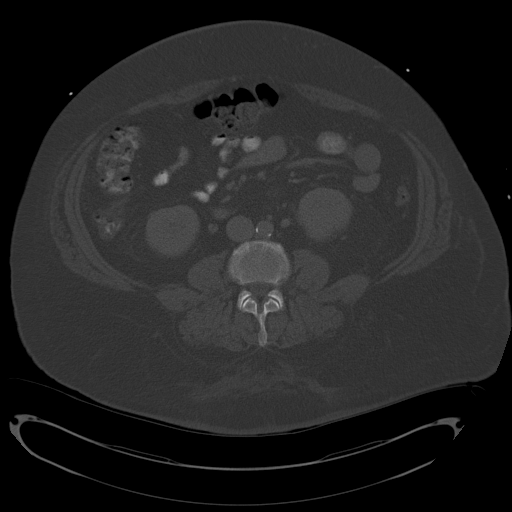
[im 59/91  soft-tissue]
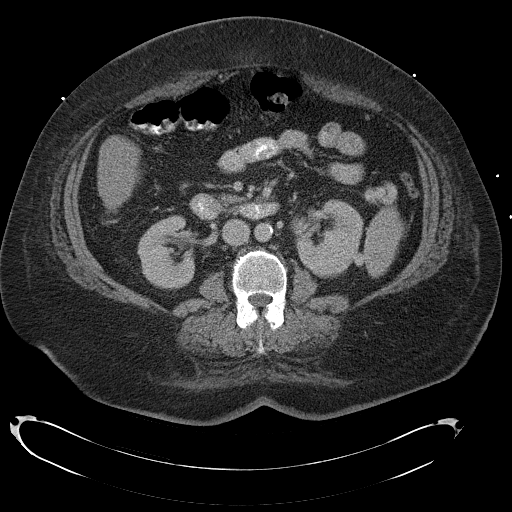
[im 69/91  soft-tissue]
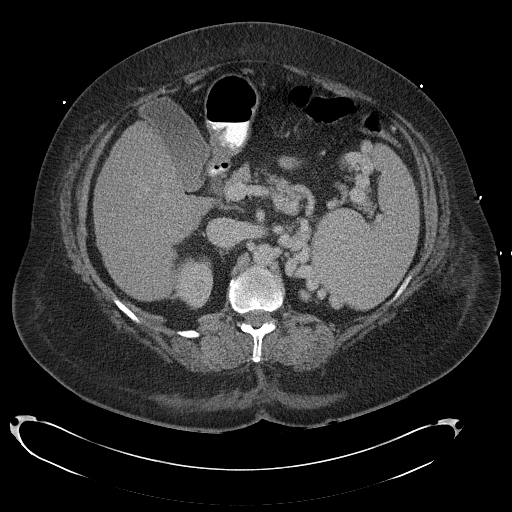
[im 75/91  soft-tissue]
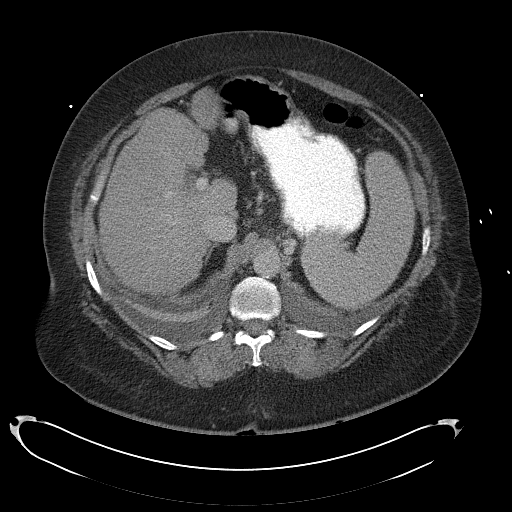
[im 80/91  soft-tissue]
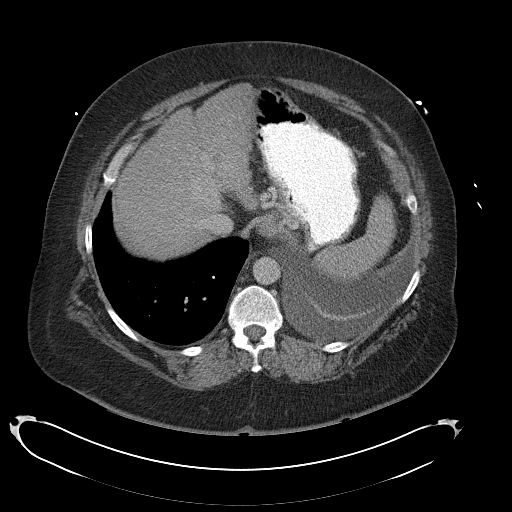
[im 85/91  soft-tissue]
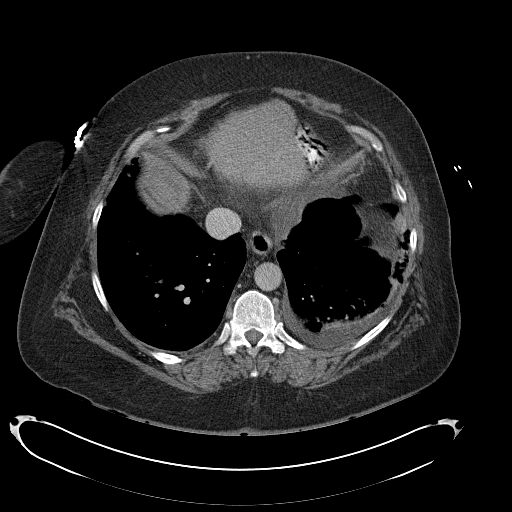

[Series 4: abd_pel_with 3.0 spo · coronal · 0.87mm/px · 3 of 118 slices shown]
[im 40/118  soft-tissue]
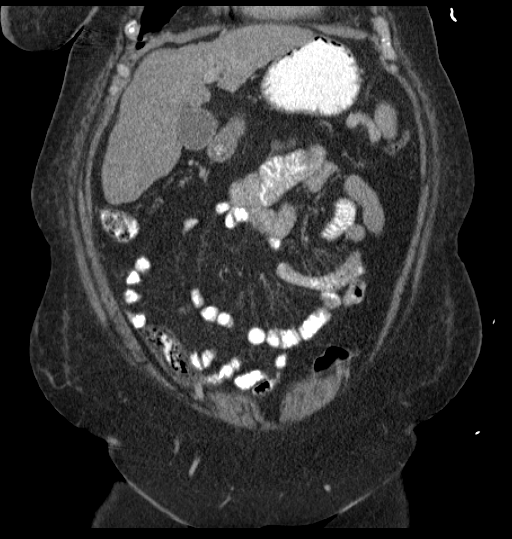
[im 53/118  soft-tissue]
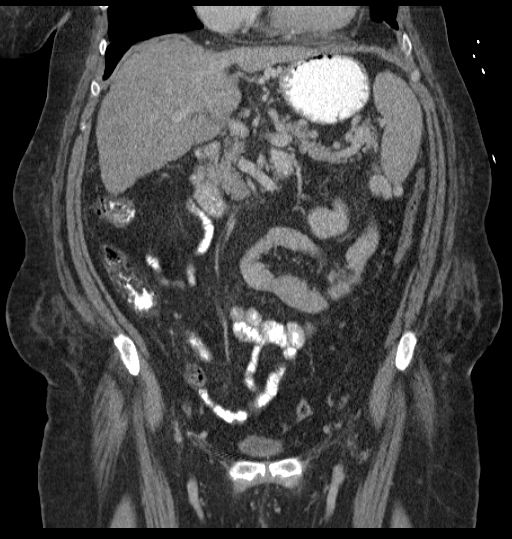
[im 66/118  soft-tissue]
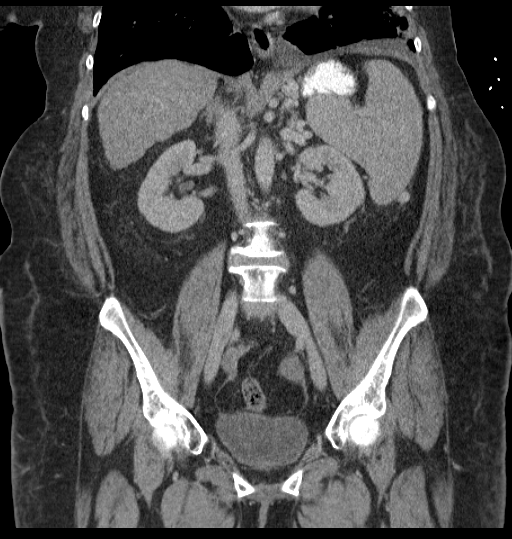

[17 of 46 positions shown; findings below may reference images not displayed]

FINDINGS: Lower Chest: Left base patchy airspace and ground-glass opacity
laterally. Small left pleural effusion. Trace right pleural fluid.
Mild cardiomegaly without pericardial effusion.

Abdomen/Pelvis: Development of moderate cirrhosis. No focal liver
lesion. Splenic size upper normal. An inferior splenic low-density
lesion is likely a cyst at 1.8 cm.

Normal stomach, pancreas, gallbladder, biliary tract, right adrenal
gland. Left adrenal gland not well visualized. Punctate bilateral
renal collecting system calculi. Aortic atherosclerosis.

Portal venous hypertension, as evidenced by extensive portosystemic
collaterals.

Normal colon, appendix, and terminal ileum. Normal small bowel
without abdominal ascites. No pelvic adenopathy. Normal urinary
bladder. Hysterectomy. No adnexal mass. No significant free fluid.

Bones/Musculoskeletal: No acute osseous abnormality. Lumbar
spondylosis/degenerative disc disease. Disc bulges at L4-5 and L2-3.
IMPRESSION: 1. Development of cirrhosis and advanced portal venous hypertension.
2. Bilateral nonobstructive nephrolithiasis.
3. Small left greater than right pleural effusions. Left base
airspace and ground-glass opacity, suspicious for infection. This is
incompletely imaged.

## 2013-08-10 MED ORDER — VITAMIN B-1 100 MG PO TABS
100.0000 mg | ORAL_TABLET | Freq: Every day | ORAL | Status: DC
  Administered 2013-08-10 – 2013-08-12 (×3): 100 mg via ORAL
  Filled 2013-08-10 (×3): qty 1

## 2013-08-10 MED ORDER — IPRATROPIUM BROMIDE 0.02 % IN SOLN: 0.5000 mg | Freq: Once | RESPIRATORY_TRACT | Status: DC

## 2013-08-10 MED ORDER — IOHEXOL 300 MG/ML  SOLN
100.0000 mL | Freq: Once | INTRAMUSCULAR | Status: AC | PRN
  Administered 2013-08-10: 100 mL via INTRAVENOUS

## 2013-08-10 MED ORDER — LORAZEPAM 1 MG PO TABS
0.0000 mg | ORAL_TABLET | Freq: Four times a day (QID) | ORAL | Status: DC
  Administered 2013-08-10 – 2013-08-12 (×4): 1 mg via ORAL
  Filled 2013-08-10 (×4): qty 1

## 2013-08-10 MED ORDER — IOHEXOL 300 MG/ML  SOLN
50.0000 mL | Freq: Once | INTRAMUSCULAR | Status: AC | PRN
  Administered 2013-08-10: 50 mL via ORAL

## 2013-08-10 MED ORDER — FOLIC ACID 1 MG PO TABS
1.0000 mg | ORAL_TABLET | Freq: Every day | ORAL | Status: DC
  Administered 2013-08-10 – 2013-08-12 (×3): 1 mg via ORAL
  Filled 2013-08-10 (×3): qty 1

## 2013-08-10 MED ORDER — SODIUM CHLORIDE 0.9 % IJ SOLN: 3.0000 mL | INTRAMUSCULAR | Status: DC | PRN

## 2013-08-10 MED ORDER — INFLUENZA VAC SPLIT QUAD 0.5 ML IM SUSP
0.5000 mL | INTRAMUSCULAR | Status: AC
  Administered 2013-08-11: 0.5 mL via INTRAMUSCULAR
  Filled 2013-08-10: qty 0.5

## 2013-08-10 MED ORDER — ALBUTEROL SULFATE (2.5 MG/3ML) 0.083% IN NEBU
2.5000 mg | INHALATION_SOLUTION | Freq: Once | RESPIRATORY_TRACT | Status: AC
  Administered 2013-08-10: 2.5 mg via RESPIRATORY_TRACT
  Filled 2013-08-10: qty 3

## 2013-08-10 MED ORDER — SODIUM CHLORIDE 0.9 % IJ SOLN
3.0000 mL | Freq: Two times a day (BID) | INTRAMUSCULAR | Status: DC
  Administered 2013-08-10 – 2013-08-12 (×4): 3 mL via INTRAVENOUS

## 2013-08-10 MED ORDER — PNEUMOCOCCAL VAC POLYVALENT 25 MCG/0.5ML IJ INJ
0.5000 mL | INJECTION | INTRAMUSCULAR | Status: AC
  Administered 2013-08-11: 0.5 mL via INTRAMUSCULAR
  Filled 2013-08-10: qty 0.5

## 2013-08-10 MED ORDER — FUROSEMIDE 40 MG PO TABS
40.0000 mg | ORAL_TABLET | Freq: Every day | ORAL | Status: DC
  Administered 2013-08-10 – 2013-08-12 (×3): 40 mg via ORAL
  Filled 2013-08-10 (×3): qty 1

## 2013-08-10 MED ORDER — THIAMINE HCL 100 MG/ML IJ SOLN: 100.0000 mg | Freq: Every day | INTRAMUSCULAR | Status: DC

## 2013-08-10 MED ORDER — SODIUM CHLORIDE 0.9 % IV SOLN: 250.0000 mL | INTRAVENOUS | Status: DC | PRN

## 2013-08-10 MED ORDER — LORAZEPAM 1 MG PO TABS: 0.0000 mg | ORAL_TABLET | Freq: Two times a day (BID) | ORAL | Status: DC

## 2013-08-10 MED ORDER — ALBUTEROL SULFATE (2.5 MG/3ML) 0.083% IN NEBU: 5.0000 mg | INHALATION_SOLUTION | Freq: Once | RESPIRATORY_TRACT | Status: DC

## 2013-08-10 MED ORDER — ADULT MULTIVITAMIN W/MINERALS CH
1.0000 | ORAL_TABLET | Freq: Every day | ORAL | Status: DC
  Administered 2013-08-10 – 2013-08-12 (×3): 1 via ORAL
  Filled 2013-08-10 (×3): qty 1

## 2013-08-10 MED ORDER — IPRATROPIUM-ALBUTEROL 0.5-2.5 (3) MG/3ML IN SOLN
3.0000 mL | Freq: Once | RESPIRATORY_TRACT | Status: AC
  Administered 2013-08-10: 3 mL via RESPIRATORY_TRACT
  Filled 2013-08-10: qty 3

## 2013-08-10 NOTE — H&P (Signed)
History and Physical  Alyssa Pena ENI:[id number] DOB: 12/19/1950 DOA: 08/10/2013  Referring physician: Dr. Roderic Palau in ED PCP: MUSE,ROCHELLE D., PA-C   Chief Complaint: Short of breath  HPI:  63 year old woman presents the emergency room with increasing shortness of breath the last 2 weeks. Screening evaluation was notable for grossly abnormal hepatic function panel, CT of the abdomen and pelvis revealed new diagnosis of cirrhosis and the patient was referred for further evaluation.  Patient reports increasing dyspnea on exertion and orthopnea over the last 2 weeks. She does have baseline orthopnea, sleeping on 3 pillows for quite some time. However she has had increased difficulty sleeping lately. She has previously been on Lasix but has not taken it for a month. No chest pain. No cardiac history. She has chronic lower extremity edema which she reports is without change. No specific aggravating or alleviating factors. She does report drinking 6 cans of beer per day.  In the emergency department afebrile, vital signs stable. No hypoxia. Hepatic function panel total bilirubin 5.0, ALT 39, AST 89, lipase 69, alkaline phosphatase 139.troponin negative. Platelet count 52. Chest x-ray pulmonary venous congestion. CT of the abdomen and pelvis revealed development of cirrhosis and advanced portal venous hypertension. Small left greater than right pleural effusions. Left base air space and groundglass opacity suspicious for infection.  Review of Systems:  Negative for fever, visual changes, rash, new muscle aches, recent chest pain, dysuria, bleeding, n/v/abdominal pain.   Positive for chronic sore throat  Past Medical History  Diagnosis Date  . Hypertension   . Fluid retention     Past Surgical History  Procedure Laterality Date  . Abdominal hysterectomy    . Kidney stone surgery      Social History:  reports that she has quit smoking. She does not have any smokeless tobacco history on file.  She reports that she drinks about 25.2 ounces of alcohol per week. She reports that she does not use illicit drugs. 6 cans per per day.  Allergies  Allergen Reactions  . Penicillins Hives    Family History  Problem Relation Age of Onset  . Lung cancer Father   . Lung cancer Brother      Prior to Admission medications   Medication Sig Start Date End Date Taking? Authorizing Provider  fluticasone (FLOVENT HFA) 110 MCG/ACT inhaler Inhale 1 puff into the lungs 2 (two) times daily as needed (shortness of breath.).    Yes Historical Provider, MD  furosemide (LASIX) 40 MG tablet Take 40 mg by mouth daily.   Yes Historical Provider, MD  guaifenesin (ROBITUSSIN) 100 MG/5ML syrup Take 400 mg by mouth 3 (three) times daily as needed for cough.   Yes Historical Provider, MD  lisinopril (PRINIVIL,ZESTRIL) 20 MG tablet Take 20 mg by mouth daily.   Yes Historical Provider, MD   Physical Exam: Filed Vitals:   08/10/13 1356 08/10/13 1416 08/10/13 1441  BP: 175/84 140/77   Pulse: 90 81   Temp: 97.7 F (36.5 C)    TempSrc: Oral    Resp: 18 20   SpO2: 99% 94% 95%    General:  Examined in the emergency department. Appears calm and comfortable. Appears jaundiced. Eyes: PERRL, normal lids, irises  ENT: grossly normal hearing, lips & tongue Neck: no LAD, masses or thyromegaly Cardiovascular: RRR, 2/6 holosystolic murmur best heard right upper sternal border, no r/g. 2+ bilateral LE edema. Respiratory: CTA bilaterally, no w/r/r. Normal respiratory effort. Abdomen: soft, obese, ntnd Skin: no rash or induration  seen. Jaundice noted neck, face. Musculoskeletal: grossly normal tone BUE/BLE Psychiatric: grossly normal mood and affect, speech fluent and appropriate Neurologic: grossly non-focal.  Wt Readings from Last 3 Encounters:  No data found for Wt    Labs on Admission:  Basic Metabolic Panel:  Recent Labs Lab 08/10/13 1435  NA 139  K 3.6*  CL 102  CO2 27  GLUCOSE 106*  BUN 6    CREATININE 0.55  CALCIUM 8.4    Liver Function Tests:  Recent Labs Lab 08/10/13 1435  AST 89*  ALT 39*  ALKPHOS 139*  BILITOT 5.0*  PROT 8.6*  ALBUMIN 2.5*    Recent Labs Lab 08/10/13 1435  LIPASE 69*   CBC:  Recent Labs Lab 08/10/13 1435  WBC 4.3  NEUTROABS 2.5  HGB 13.4  HCT 39.0  MCV 108.9*  PLT 52*    Cardiac Enzymes:  Recent Labs Lab 08/10/13 1435  TROPONINI <0.30    Recent Labs  08/10/13 1435  PROBNP 121.1    Radiological Exams on Admission: Ct Abdomen Pelvis W Contrast  08/10/2013   CLINICAL DATA:  Shortness of breath. Leg swelling. Hysterectomy. Abdominal pain.  EXAM: CT ABDOMEN AND PELVIS WITH CONTRAST  TECHNIQUE: Multidetector CT imaging of the abdomen and pelvis was performed using the standard protocol following bolus administration of intravenous contrast.  CONTRAST:  41mL OMNIPAQUE IOHEXOL 300 MG/ML SOLN, 168mL OMNIPAQUE IOHEXOL 300 MG/ML SOLN  COMPARISON:  CT ABDOMEN W/O CM dated 12/15/2001; DG CHEST 1V PORT dated 08/10/2013  FINDINGS: Lower Chest: Left base patchy airspace and ground-glass opacity laterally. Small left pleural effusion. Trace right pleural fluid. Mild cardiomegaly without pericardial effusion.  Abdomen/Pelvis: Development of moderate cirrhosis. No focal liver lesion. Splenic size upper normal. An inferior splenic low-density lesion is likely a cyst at 1.8 cm.  Normal stomach, pancreas, gallbladder, biliary tract, right adrenal gland. Left adrenal gland not well visualized. Punctate bilateral renal collecting system calculi. Aortic atherosclerosis.  Portal venous hypertension, as evidenced by extensive portosystemic collaterals.  Normal colon, appendix, and terminal ileum. Normal small bowel without abdominal ascites. No pelvic adenopathy. Normal urinary bladder. Hysterectomy. No adnexal mass. No significant free fluid.  Bones/Musculoskeletal: No acute osseous abnormality. Lumbar spondylosis/degenerative disc disease. Disc bulges at  L4-5 and L2-3.  IMPRESSION: 1. Development of cirrhosis and advanced portal venous hypertension. 2. Bilateral nonobstructive nephrolithiasis. 3. Small left greater than right pleural effusions. Left base airspace and ground-glass opacity, suspicious for infection. This is incompletely imaged.   Electronically Signed   By: Abigail Miyamoto M.D.   On: 08/10/2013 17:01   Dg Chest Portable 1 View  08/10/2013   CLINICAL DATA:  Cough.  Short of breath.  EXAM: PORTABLE CHEST - 1 VIEW  COMPARISON:  None.  FINDINGS: Cardiopericardial silhouette is within normal limits for projection. There is pulmonary vascular congestion. No focal consolidation. Exam is underpenetrated with opacity over both bases. Monitoring leads project over the chest.  IMPRESSION: Pulmonary vascular congestion. Consider departmental PA and lateral when patient condition permits.   Electronically Signed   By: Dereck Ligas M.D.   On: 08/10/2013 14:36    EKG: Independently reviewed. Sinus rhythm, cannot rule out septal infarct, age unknown. No previous studies available for comparison.   Principal Problem:   Alcoholic hepatitis Active Problems:   Cirrhosis   Portal venous hypertension   Thrombocytopenia   Alcohol dependence   Assessment/Plan 1. Alcoholic hepatitis with jaundice. 2. New diagnosis of cirrhosis and advanced portal venous hypertension. Presumably secondary to alcohol abuse  long-standing. No history of bleeding. 3. Progressive orthopnea and dyspnea on exertion with chronic lower extremity edema. Suspect related to cirrhosis. No hypoxia or tachypnea. 4. Elevated lipase. Modestly elevated. No abdominal pain, nausea or vomiting. Clinically insignificant. 5. Thrombocytopenia likely related to cirrhosis. Patient reports this is long-standing. No bleeding. 6. Murmur. Suspect aortic stenosis by location. 7. Left base air space opacity on chest CT. Radiologist reported as suspicious for infection, however the patient has no  signs or symptoms to suggest infection, no hypoxia or tachypnea. White blood cell count is normal. Favor observation.   Plan observation, repeat CMP in the morning, check INR, PTT. Check hepatitis panel to rule out concomitant (chronic) hepatitis infection. GI consultation for further recommendations.  CIWA, social work consult. Absolutely must stop drinking. I stressed with her that she must never drink alcohol again.  Lasix for volume overload secondary to cirrhosis.  2-D echocardiogram to assess murmur and heart function.  Would anticipate discharge 2/25 if remains stable. I discussed CT findings, new diagnosis of cirrhosis, laboratory studies with patient and husband at bedside.  Code Status: full code  DVT prophylaxis:SCDs Family Communication:  Disposition Plan/Anticipated LOS: obs, 24 hours  Time spent: 60 minutes  Murray Hodgkins, MD  [facility] Pager [phone] 08/10/2013, 6:06 PM

## 2013-08-10 NOTE — ED Provider Notes (Signed)
CSN: [id number]     Arrival date & time 08/10/13  1353 History  This chart was scribed for Maudry Diego, MD by Ludger Nutting, ED Scribe. This patient was seen in room APA12/APA12 and the patient's care was started 2:12 PM.    Chief Complaint  Patient presents with  . Shortness of Breath      Patient is a 63 y.o. female presenting with shortness of breath. The history is provided by the patient. No language interpreter was used.  Shortness of Breath Severity:  Moderate Onset quality:  Gradual Duration:  2 weeks Timing:  Constant Progression:  Worsening Chronicity:  New Context: not URI   Relieved by:  Nothing Exacerbated by: laying down. Associated symptoms: sore throat   Associated symptoms: no abdominal pain, no chest pain, no cough, no headaches, no rash and no sputum production     HPI Comments: Alyssa Pena is a 63 y.o. female with past medical history of HTN and fluid retention who presents to the Emergency Department complaining of 2 weeks of constant, gradually worsening SOB with associated hoarse voice. She also has bilateral lower extremity swelling which she has at baseline. She states the SOB worsens with laying down. Per nursing note, pt has not taken lisinopril or lasix in the past 1 month. She denies fever.    Past Medical History  Diagnosis Date  . Hypertension   . Fluid retention    Past Surgical History  Procedure Laterality Date  . Abdominal hysterectomy    . Kidney stone surgery     History reviewed. No pertinent family history. History  Substance Use Topics  . Smoking status: Former Research scientist (life sciences)  . Smokeless tobacco: Not on file  . Alcohol Use: 1.2 oz/week    2 Cans of beer per week     Comment: daily   OB History   Grav Para Term Preterm Abortions TAB SAB Ect Mult Living                 Review of Systems  Constitutional: Negative for appetite change and fatigue.  HENT: Positive for sore throat. Negative for congestion, ear discharge and sinus  pressure.   Eyes: Negative for discharge.  Respiratory: Positive for shortness of breath. Negative for cough and sputum production.   Cardiovascular: Positive for leg swelling. Negative for chest pain.  Gastrointestinal: Negative for abdominal pain and diarrhea.  Genitourinary: Negative for frequency and hematuria.  Musculoskeletal: Negative for back pain.  Skin: Negative for rash.  Neurological: Negative for seizures and headaches.  Psychiatric/Behavioral: Negative for hallucinations.      Allergies  Penicillins  Home Medications   Current Outpatient Rx  Name  Route  Sig  Dispense  Refill  . fluticasone (FLOVENT HFA) 110 MCG/ACT inhaler   Inhalation   Inhale 1 puff into the lungs 2 (two) times daily as needed (shortness of breath.).          Marland Kitchen furosemide (LASIX) 40 MG tablet   Oral   Take 40 mg by mouth daily.         Marland Kitchen guaifenesin (ROBITUSSIN) 100 MG/5ML syrup   Oral   Take 400 mg by mouth 3 (three) times daily as needed for cough.         Marland Kitchen lisinopril (PRINIVIL,ZESTRIL) 20 MG tablet   Oral   Take 20 mg by mouth daily.          BP 140/77  Pulse 81  Temp(Src) 97.7 F (36.5 C) (Oral)  Resp  20  SpO2 95% Physical Exam  Nursing note and vitals reviewed. Constitutional: She is oriented to person, place, and time. She appears well-developed.  HENT:  Head: Normocephalic.  Eyes: Conjunctivae and EOM are normal. No scleral icterus.  Neck: Neck supple. No thyromegaly present.  Cardiovascular: Normal rate, regular rhythm and normal heart sounds.  Exam reveals no gallop and no friction rub.   No murmur heard. Pulmonary/Chest: Effort normal and breath sounds normal. No stridor. No respiratory distress. She has no wheezes. She has no rales. She exhibits no tenderness.  Abdominal: She exhibits no distension. There is no tenderness. There is no rebound.  Musculoskeletal: Normal range of motion. She exhibits edema (2+ edema from knees distally).  Lymphadenopathy:     She has no cervical adenopathy.  Neurological: She is oriented to person, place, and time. She exhibits normal muscle tone. Coordination normal.  Skin: No rash noted. No erythema.  Psychiatric: She has a normal mood and affect. Her behavior is normal.    ED Course  Procedures (including critical care time)  DIAGNOSTIC STUDIES: Oxygen Saturation is 99% on RA, normal by my interpretation.    COORDINATION OF CARE: 2:19 PM Discussed treatment plan with pt at bedside and pt agreed to plan.   Labs Review Labs Reviewed  CBC WITH DIFFERENTIAL - Abnormal; Notable for the following:    RBC 3.58 (*)    MCV 108.9 (*)    MCH 37.4 (*)    RDW 15.7 (*)    Platelets 52 (*)    Monocytes Relative 19 (*)    All other components within normal limits  COMPREHENSIVE METABOLIC PANEL - Abnormal; Notable for the following:    Potassium 3.6 (*)    Glucose, Bld 106 (*)    Total Protein 8.6 (*)    Albumin 2.5 (*)    AST 89 (*)    ALT 39 (*)    Alkaline Phosphatase 139 (*)    Total Bilirubin 5.0 (*)    All other components within normal limits  LIPASE, BLOOD - Abnormal; Notable for the following:    Lipase 69 (*)    All other components within normal limits  TROPONIN I  PRO B NATRIURETIC PEPTIDE   Imaging Review Ct Abdomen Pelvis W Contrast  08/10/2013   CLINICAL DATA:  Shortness of breath. Leg swelling. Hysterectomy. Abdominal pain.  EXAM: CT ABDOMEN AND PELVIS WITH CONTRAST  TECHNIQUE: Multidetector CT imaging of the abdomen and pelvis was performed using the standard protocol following bolus administration of intravenous contrast.  CONTRAST:  24mL OMNIPAQUE IOHEXOL 300 MG/ML SOLN, 174mL OMNIPAQUE IOHEXOL 300 MG/ML SOLN  COMPARISON:  CT ABDOMEN W/O CM dated 12/15/2001; DG CHEST 1V PORT dated 08/10/2013  FINDINGS: Lower Chest: Left base patchy airspace and ground-glass opacity laterally. Small left pleural effusion. Trace right pleural fluid. Mild cardiomegaly without pericardial effusion.   Abdomen/Pelvis: Development of moderate cirrhosis. No focal liver lesion. Splenic size upper normal. An inferior splenic low-density lesion is likely a cyst at 1.8 cm.  Normal stomach, pancreas, gallbladder, biliary tract, right adrenal gland. Left adrenal gland not well visualized. Punctate bilateral renal collecting system calculi. Aortic atherosclerosis.  Portal venous hypertension, as evidenced by extensive portosystemic collaterals.  Normal colon, appendix, and terminal ileum. Normal small bowel without abdominal ascites. No pelvic adenopathy. Normal urinary bladder. Hysterectomy. No adnexal mass. No significant free fluid.  Bones/Musculoskeletal: No acute osseous abnormality. Lumbar spondylosis/degenerative disc disease. Disc bulges at L4-5 and L2-3.  IMPRESSION: 1. Development of cirrhosis and advanced  portal venous hypertension. 2. Bilateral nonobstructive nephrolithiasis. 3. Small left greater than right pleural effusions. Left base airspace and ground-glass opacity, suspicious for infection. This is incompletely imaged.   Electronically Signed   By: Abigail Miyamoto M.D.   On: 08/10/2013 17:01   Dg Chest Portable 1 View  08/10/2013   CLINICAL DATA:  Cough.  Short of breath.  EXAM: PORTABLE CHEST - 1 VIEW  COMPARISON:  None.  FINDINGS: Cardiopericardial silhouette is within normal limits for projection. There is pulmonary vascular congestion. No focal consolidation. Exam is underpenetrated with opacity over both bases. Monitoring leads project over the chest.  IMPRESSION: Pulmonary vascular congestion. Consider departmental PA and lateral when patient condition permits.   Electronically Signed   By: Dereck Ligas M.D.   On: 08/10/2013 14:36    EKG Interpretation   None       MDM   Final diagnoses:  None  The chart was scribed for me under my direct supervision.  I personally performed the history, physical, and medical decision making and all procedures in the evaluation of this  patient.Maudry Diego, MD 08/10/13 [phone]

## 2013-08-10 NOTE — ED Notes (Addendum)
Pt refusing to drink PO contrast. Pt reports "what does my stomach have to do with my breathing." Consulted EDP and reported to pt that blood work reflects liver abnormalities and that if the liver is enlarged could affect pt breathing. Pt and pt family verbalized understanding and is tolerating PO contrast well.

## 2013-08-10 NOTE — ED Notes (Signed)
Feels sob, has not been taking lisinopril, or lasix in  A month.  Upper back pain for 2 weeks.  Sounds hoarse.  Sore throat.diarrhea for 1 week.

## 2013-08-11 DIAGNOSIS — I059 Rheumatic mitral valve disease, unspecified: Secondary | ICD-10-CM

## 2013-08-11 DIAGNOSIS — E876 Hypokalemia: Secondary | ICD-10-CM

## 2013-08-11 DIAGNOSIS — K766 Portal hypertension: Secondary | ICD-10-CM

## 2013-08-11 DIAGNOSIS — F1910 Other psychoactive substance abuse, uncomplicated: Secondary | ICD-10-CM

## 2013-08-11 DIAGNOSIS — R894 Abnormal immunological findings in specimens from other organs, systems and tissues: Secondary | ICD-10-CM

## 2013-08-11 DIAGNOSIS — K7010 Alcoholic hepatitis without ascites: Secondary | ICD-10-CM

## 2013-08-11 DIAGNOSIS — F1990 Other psychoactive substance use, unspecified, uncomplicated: Secondary | ICD-10-CM | POA: Diagnosis present

## 2013-08-11 DIAGNOSIS — K769 Liver disease, unspecified: Secondary | ICD-10-CM

## 2013-08-11 LAB — MAGNESIUM: Magnesium: 1.6 mg/dL (ref 1.5–2.5)

## 2013-08-11 LAB — COMPREHENSIVE METABOLIC PANEL
ALT: 30 U/L (ref 0–35)
AST: 65 U/L — ABNORMAL HIGH (ref 0–37)
Albumin: 2.1 g/dL — ABNORMAL LOW (ref 3.5–5.2)
Alkaline Phosphatase: 112 U/L (ref 39–117)
BUN: 7 mg/dL (ref 6–23)
CALCIUM: 8 mg/dL — AB (ref 8.4–10.5)
CO2: 30 meq/L (ref 19–32)
CREATININE: 0.61 mg/dL (ref 0.50–1.10)
Chloride: 104 mEq/L (ref 96–112)
GFR calc Af Amer: >90 mL/min (ref >90)
Glucose, Bld: 99 mg/dL (ref 70–99)
Potassium: 3.4 mEq/L — ABNORMAL LOW (ref 3.7–5.3)
SODIUM: 141 meq/L (ref 137–147)
TOTAL PROTEIN: 7.1 g/dL (ref 6.0–8.3)
Total Bilirubin: 4.6 mg/dL — ABNORMAL HIGH (ref 0.3–1.2)

## 2013-08-11 LAB — RPR: RPR: NONREACTIVE

## 2013-08-11 LAB — RAPID HIV SCREEN (WH-MAU): SUDS RAPID HIV SCREEN: NONREACTIVE

## 2013-08-11 LAB — HEPATITIS PANEL, ACUTE
HCV AB: REACTIVE — AB
HEP A IGM: NONREACTIVE
HEP B S AG: NEGATIVE
Hep B C IgM: NONREACTIVE

## 2013-08-11 LAB — RAPID URINE DRUG SCREEN, HOSP PERFORMED
Amphetamines: NOT DETECTED
BARBITURATES: NOT DETECTED
Benzodiazepines: NOT DETECTED
Cocaine: NOT DETECTED
Opiates: NOT DETECTED
TETRAHYDROCANNABINOL: NOT DETECTED

## 2013-08-11 LAB — CBC
HEMATOCRIT: 34.6 % — AB (ref 36.0–46.0)
HEMOGLOBIN: 12 g/dL (ref 12.0–15.0)
MCH: 38 pg — ABNORMAL HIGH (ref 26.0–34.0)
MCHC: 34.7 g/dL (ref 30.0–36.0)
MCV: 109.5 fL — ABNORMAL HIGH (ref 78.0–100.0)
Platelets: 47 10*3/uL — ABNORMAL LOW (ref 150–400)
RBC: 3.16 MIL/uL — AB (ref 3.87–5.11)
RDW: 16.2 % — ABNORMAL HIGH (ref 11.5–15.5)
WBC: 4.2 10*3/uL (ref 4.0–10.5)

## 2013-08-11 MED ORDER — POTASSIUM CHLORIDE CRYS ER 20 MEQ PO TBCR
40.0000 meq | EXTENDED_RELEASE_TABLET | Freq: Once | ORAL | Status: AC
  Administered 2013-08-11: 40 meq via ORAL
  Filled 2013-08-11: qty 2

## 2013-08-11 NOTE — Clinical Social Work Psychosocial (Signed)
Clinical Social Work Department BRIEF PSYCHOSOCIAL ASSESSMENT 08/11/2013  Patient:  Alyssa Pena, Alyssa Pena     Account Number:  [id number]     Admit date:  08/10/2013  Clinical Social Worker:  Edwyna Shell, CLINICAL SOCIAL WORKER  Date/Time:  08/11/2013 09:00 AM  Referred by:  Physician  Date Referred:  08/11/2013 Referred for  Substance Abuse   Other Referral:   Interview type:  Patient Other interview type:   Also spoke w husband in room w patient consent    PSYCHOSOCIAL DATA Living Status:  FAMILY Admitted from facility:   Level of care:   Primary support name:  Vinisha Faxon Primary support relationship to patient:  SPOUSE Degree of support available:   Supportive husband, family facing financial challenges due to husbands current unemployment    CURRENT CONCERNS Current Concerns  Substance Abuse   Other Concerns:    SOCIAL WORK ASSESSMENT / PLAN CSW met w patient at bedside, administered SBIRT.  Patient scored 16 total (10 - consumption, 0 - dependence, 6 - alcohol related problem score).  Patient admits to drinking 6 - 7 12 oz beers/day every day - "I'm not on Valium so it helps me deal w my irritation."  Says she began drinking at age 7, has been drinking daily since then.  Patient has mother and father who have both quit drinking, says they quit without any assistance from 12 step groups or treatment facilities.    CSW discussed patient's drinking pattern, patient begins to drink approx 5 PM, says it helps her to deal w stress when "my husband or my dogs irritate me."  Consumes 6 - 7 beers until she goes to bed at 11 PM.  Has no desire to drink in the AM, says "it makes me feel sick."  Drinking has stayed stable through the years; however patient does admit that when her husband was working out of town last fall "my drinking got out of hand."  Also admits to drinking a fifth of vodka w husband at Christmas, says that she was unable to remember what happened the night before  when this happened.    Patient previously felt that her drinking was not a problem as she has other family members who drank more, including an aunt who died in jail from alcohol use, and sees her drinking as less serious than others.    Gave patient resources including AA meeting list  - patient says she previously attended [facility] while former husband was in treatment in [place].  Says she did not find it valuable, but admits that she did not work program for her own benefit but was there as result of her former husband's alcohol treatment program.  Family has significant financial stress due to husband's current unemployment, do not have sufficient money to meet their current expenses. Husband actively seeking work, but has not found anything yet.  Patient aware that MD has strongly counseled complete abstinence, also mentioned that PCP has been adamant that she quit drinking.  Patient feels that she can quit on her own - "I will just ignore it when its in the fridge" - husband drinks beer and keeps in house.  Could not state any plan for stopping drinking other than "I will just quit."  Did not commit to contacting AA and had no interest in any other treatment programs, says [facility] [place] "[place] far away" and that gas money is a limitation for family.    CSW reinforced MD recommendation of complete abstinence, reviewed  negative health effects of alcohol use, gave resources to support abstinence.   Assessment/plan status:  Referral to [employer] Other assessment/ plan:   Information/referral to community resources:   AA meeting list  Alcohol Use and Older Adults pamphlet  Physical effects of alcohol handout    PATIENT'S/FAMILY'S RESPONSE TO PLAN OF CARE: Patient feels she can quit on her own w no assistance from 12 step or treatment programs.  States she plans to quit and clearly understands that MD has recommended complete abstinence.        Edwyna Shell, LCSW Clinical Social  Worker [phone])

## 2013-08-11 NOTE — Consult Note (Signed)
Reason for Consult: Cirrhosis Referring Physician: Hospitalist  Alyssa Pena is an 63 y.o. female.  HPI: Admitted thru the ED yesterday for SOB. She denies any other symptoms. She does have chronic swelling to her lower extremities which appears minimal. She is followed by the [facility].  Home medications includes Lasix 70m for edema and Lisinopril for hypertension. Has been off both for about a month due to expense. She quit smoking 8-9 yrs after smoking for over 40 yrs. She does tell me she drank etoh daily. She was drinking 5-7 beers a night for years. No etoh in 2 days. She tells me her platelets have been low in the past. She denies any pain. She denies abdominal distention.                 She has seven tattoos. Her last tattoo 20 yrs ago. Some were not professional. She has a hx of IV drug use years ago (Cocaine).            Denies hx of DTs. Married, 4 children in good health. Patient does not work.                                                                                                                                               Past Medical History  Diagnosis Date  . Hypertension   . Fluid retention   . Alcohol dependence 08/10/2013  . Alcoholic hepatitis 24/46/9507 . Cirrhosis 08/10/2013  . Portal venous hypertension 08/10/2013  . Thrombocytopenia 08/10/2013    Past Surgical History  Procedure Laterality Date  . Abdominal hysterectomy    . Kidney stone surgery      Family History  Problem Relation Age of Onset  . Lung cancer Father   . Lung cancer Brother     Social History:  reports that she has quit smoking. She does not have any smokeless tobacco history on file. She reports that she drinks alcohol. She reports that she does not use illicit drugs.  Allergies:  Allergies  Allergen Reactions  . Penicillins Hives    Medications: I have reviewed the patient's current medications.  Results for orders placed during the hospital encounter of 08/10/13 (from the  past 48 hour(s))  PROTIME-INR     Status: Abnormal   Collection Time    08/10/13  2:30 PM      Result Value Ref Range   Prothrombin Time 17.1 (*) 11.6 - 15.2 seconds   INR 1.43  0.00 - 1.49  APTT     Status: Abnormal   Collection Time    08/10/13  2:30 PM      Result Value Ref Range   aPTT 38 (*) 24 - 37 seconds   Comment:            IF BASELINE aPTT IS ELEVATED,     SUGGEST PATIENT  RISK ASSESSMENT     BE USED TO DETERMINE APPROPRIATE     ANTICOAGULANT THERAPY.  CBC WITH DIFFERENTIAL     Status: Abnormal   Collection Time    08/10/13  2:35 PM      Result Value Ref Range   WBC 4.3  4.0 - 10.5 K/uL   RBC 3.58 (*) 3.87 - 5.11 MIL/uL   Hemoglobin 13.4  12.0 - 15.0 g/dL   HCT 39.0  36.0 - 46.0 %   MCV 108.9 (*) 78.0 - 100.0 fL   MCH 37.4 (*) 26.0 - 34.0 pg   MCHC 34.4  30.0 - 36.0 g/dL   RDW 15.7 (*) 11.5 - 15.5 %   Platelets 52 (*) 150 - 400 K/uL   Neutrophils Relative % 56  43 - 77 %   Lymphocytes Relative 22  12 - 46 %   Monocytes Relative 19 (*) 3 - 12 %   Eosinophils Relative 2  0 - 5 %   Basophils Relative 1  0 - 1 %   Neutro Abs 2.5  1.7 - 7.7 K/uL   Lymphs Abs 0.9  0.7 - 4.0 K/uL   Monocytes Absolute 0.8  0.1 - 1.0 K/uL   Eosinophils Absolute 0.1  0.0 - 0.7 K/uL   Basophils Absolute 0.0  0.0 - 0.1 K/uL   RBC Morphology POLYCHROMASIA PRESENT     WBC Morphology ATYPICAL LYMPHOCYTES     Comment: VACUOLATED NEUTROPHILS   Smear Review PLATELET COUNT CONFIRMED BY SMEAR     Comment: PLATELETS APPEAR DECREASED     LARGE PLATELETS PRESENT  COMPREHENSIVE METABOLIC PANEL     Status: Abnormal   Collection Time    08/10/13  2:35 PM      Result Value Ref Range   Sodium 139  137 - 147 mEq/L   Potassium 3.6 (*) 3.7 - 5.3 mEq/L   Chloride 102  96 - 112 mEq/L   CO2 27  19 - 32 mEq/L   Glucose, Bld 106 (*) 70 - 99 mg/dL   BUN 6  6 - 23 mg/dL   Creatinine, Ser 0.55  0.50 - 1.10 mg/dL   Calcium 8.4  8.4 - 10.5 mg/dL   Total Protein 8.6 (*) 6.0 - 8.3 g/dL   Albumin 2.5 (*) 3.5  - 5.2 g/dL   AST 89 (*) 0 - 37 U/L   ALT 39 (*) 0 - 35 U/L   Alkaline Phosphatase 139 (*) 39 - 117 U/L   Total Bilirubin 5.0 (*) 0.3 - 1.2 mg/dL   GFR calc non Af Amer >90  >90 mL/min   GFR calc Af Amer >90  >90 mL/min   Comment: (NOTE)     The eGFR has been calculated using the CKD EPI equation.     This calculation has not been validated in all clinical situations.     eGFR's persistently <90 mL/min signify possible Chronic Kidney     Disease.  TROPONIN I     Status: None   Collection Time    08/10/13  2:35 PM      Result Value Ref Range   Troponin I <0.30  <0.30 ng/mL   Comment:            Due to the release kinetics of cTnI,     a negative result within the first hours     of the onset of symptoms does not rule out     myocardial infarction with certainty.  If myocardial infarction is still suspected,     repeat the test at appropriate intervals.  PRO B NATRIURETIC PEPTIDE     Status: None   Collection Time    08/10/13  2:35 PM      Result Value Ref Range   Pro B Natriuretic peptide (BNP) 121.1  0 - 125 pg/mL  LIPASE, BLOOD     Status: Abnormal   Collection Time    08/10/13  2:35 PM      Result Value Ref Range   Lipase 69 (*) 11 - 59 U/L  COMPREHENSIVE METABOLIC PANEL     Status: Abnormal   Collection Time    08/11/13  5:23 AM      Result Value Ref Range   Sodium 141  137 - 147 mEq/L   Potassium 3.4 (*) 3.7 - 5.3 mEq/L   Chloride 104  96 - 112 mEq/L   CO2 30  19 - 32 mEq/L   Glucose, Bld 99  70 - 99 mg/dL   BUN 7  6 - 23 mg/dL   Creatinine, Ser 0.61  0.50 - 1.10 mg/dL   Calcium 8.0 (*) 8.4 - 10.5 mg/dL   Total Protein 7.1  6.0 - 8.3 g/dL   Albumin 2.1 (*) 3.5 - 5.2 g/dL   AST 65 (*) 0 - 37 U/L   ALT 30  0 - 35 U/L   Alkaline Phosphatase 112  39 - 117 U/L   Total Bilirubin 4.6 (*) 0.3 - 1.2 mg/dL   GFR calc non Af Amer >90  >90 mL/min   GFR calc Af Amer >90  >90 mL/min   Comment: (NOTE)     The eGFR has been calculated using the CKD EPI equation.     This  calculation has not been validated in all clinical situations.     eGFR's persistently <90 mL/min signify possible Chronic Kidney     Disease.  CBC     Status: Abnormal   Collection Time    08/11/13  5:23 AM      Result Value Ref Range   WBC 4.2  4.0 - 10.5 K/uL   RBC 3.16 (*) 3.87 - 5.11 MIL/uL   Hemoglobin 12.0  12.0 - 15.0 g/dL   HCT 34.6 (*) 36.0 - 46.0 %   MCV 109.5 (*) 78.0 - 100.0 fL   MCH 38.0 (*) 26.0 - 34.0 pg   MCHC 34.7  30.0 - 36.0 g/dL   RDW 16.2 (*) 11.5 - 15.5 %   Platelets 47 (*) 150 - 400 K/uL   Comment: SPECIMEN CHECKED FOR CLOTS     CONSISTENT WITH PREVIOUS RESULT    Ct Abdomen Pelvis W Contrast  08/10/2013   CLINICAL DATA:  Shortness of breath. Leg swelling. Hysterectomy. Abdominal pain.  EXAM: CT ABDOMEN AND PELVIS WITH CONTRAST  TECHNIQUE: Multidetector CT imaging of the abdomen and pelvis was performed using the standard protocol following bolus administration of intravenous contrast.  CONTRAST:  67m OMNIPAQUE IOHEXOL 300 MG/ML SOLN, 1052mOMNIPAQUE IOHEXOL 300 MG/ML SOLN  COMPARISON:  CT ABDOMEN W/O CM dated 12/15/2001; DG CHEST 1V PORT dated 08/10/2013  FINDINGS: Lower Chest: Left base patchy airspace and ground-glass opacity laterally. Small left pleural effusion. Trace right pleural fluid. Mild cardiomegaly without pericardial effusion.  Abdomen/Pelvis: Development of moderate cirrhosis. No focal liver lesion. Splenic size upper normal. An inferior splenic low-density lesion is likely a cyst at 1.8 cm.  Normal stomach, pancreas, gallbladder, biliary tract, right adrenal gland. Left adrenal  gland not well visualized. Punctate bilateral renal collecting system calculi. Aortic atherosclerosis.  Portal venous hypertension, as evidenced by extensive portosystemic collaterals.  Normal colon, appendix, and terminal ileum. Normal small bowel without abdominal ascites. No pelvic adenopathy. Normal urinary bladder. Hysterectomy. No adnexal mass. No significant free fluid.   Bones/Musculoskeletal: No acute osseous abnormality. Lumbar spondylosis/degenerative disc disease. Disc bulges at L4-5 and L2-3.  IMPRESSION: 1. Development of cirrhosis and advanced portal venous hypertension. 2. Bilateral nonobstructive nephrolithiasis. 3. Small left greater than right pleural effusions. Left base airspace and ground-glass opacity, suspicious for infection. This is incompletely imaged.   Electronically Signed   By: Abigail Miyamoto M.D.   On: 08/10/2013 17:01   Dg Chest Portable 1 View  08/10/2013   CLINICAL DATA:  Cough.  Short of breath.  EXAM: PORTABLE CHEST - 1 VIEW  COMPARISON:  None.  FINDINGS: Cardiopericardial silhouette is within normal limits for projection. There is pulmonary vascular congestion. No focal consolidation. Exam is underpenetrated with opacity over both bases. Monitoring leads project over the chest.  IMPRESSION: Pulmonary vascular congestion. Consider departmental PA and lateral when patient condition permits.   Electronically Signed   By: Dereck Ligas M.D.   On: 08/10/2013 14:36    ROS Blood pressure 137/76, pulse 87, temperature 98.4 F (36.9 C), temperature source Oral, resp. rate 20, height 5' 5"  (1.651 m), weight 254 lb 3.1 oz (115.3 kg), SpO2 91.00%. Physical Exam Alert and oriented. Skin warm and dry. Oral mucosa is moist.   . Sclera icteric, conjunctivae is pink. Thyroid not enlarged. No cervical lymphadenopathy. Lungs clear. Heart regular rate and rhythm.  Murmur heard. Abdomen is soft. Bowel sounds are positive. No hepatomegaly. No abdominal masses felt. No tenderness.  1-2 + edema to lower extremities (below knee).  Assessment/Plan: Probable Alcoholic hepatitis. Liver enzymes are coming down. Thrombocytopenia related to her cirrhosis.  I discussed at length that she must stop drinking.   Hepatitis Panel pending.   SETZER,TERRI W 08/11/2013, 8:55 AM   GI attending note; Patient interviewed and examined. Imaging studies reviewed. Patient has  alcoholic liver disease. She has alcoholic hepatitis and CT reveals changes of cirrhosis and portal hypertension i.e. splenomegaly and collaterals but no evidence of ascites. She is responding to diuretic therapy as for his lower extremity edema is concerned. Bilirubin is trending downwards. Hepatitis discriminate function is 28.46 and therefore there is no indication for steroid use. She has multiple tattoos and therefore will check her for hepatitis C as well as hepatitis B. Will also check alpha-fetoprotein for [facility] screening. Serious nature of patient's condition discussed at length and if she is going to have any chance she has no choice but to quit drinking alcohol altogether. She should consider rehabilitation. She will also need EGD at some point looking for esophageal or gastric varices in which case primary prophylaxis can be initiated. Will recheck INR with a.m. Lab.

## 2013-08-11 NOTE — Progress Notes (Signed)
[facility] PROGRESS NOTE  VANNESSA GODOWN HEN:[id number] DOB: December 27, 1950 DOA: 08/10/2013 PCP: MUSE,ROCHELLE D., PA-C  Assessment/Plan:  Alcoholic hepatitis (history IV drug use)  -Awaiting hepatitis panel -HIV pending -RPR pending - awaiting GI recommendations  Alcoholism -Continue on CIWA protocol -Obtain echocardiogram R/O cardiomyopathy -Consult nurse case manager for intensive outpatient substance abuse program([facility]) -  Heart failure? -See alcoholism  Hypokalemia -K-Dur 59meq x 1  Thrombocytopenia -Most likely alcohol induced, continue to monitor     Code Status: Full Family Communication: None available Disposition Plan: After heart failure ruled out   Consultants:    Procedures:    Antibiotics:    HPI/Subjective: 63yo   WF PMHx alcoholism, portal venous hypertension, cirrhosis, alcoholic hepatitis,  x-year-old woman presents the emergency room with increasing shortness of breath the last 2 weeks. Screening evaluation was notable for grossly abnormal hepatic function panel, CT of the abdomen and pelvis revealed new diagnosis of cirrhosis and the patient was referred for further evaluation.  Patient reports increasing dyspnea on exertion and orthopnea over the last 2 weeks. She does have baseline orthopnea, sleeping on 3 pillows for quite some time. However she has had increased difficulty sleeping lately. She has previously been on Lasix but has not taken it for a month. No chest pain. No cardiac history. She has chronic lower extremity edema which she reports is without change. No specific aggravating or alleviating factors. She does report drinking 6 cans of beer per day.  In the emergency department afebrile, vital signs stable. No hypoxia. Hepatic function panel total bilirubin 5.0, ALT 39, AST 89, lipase 69, alkaline phosphatase 139.troponin negative. Platelet count 52. Chest x-ray pulmonary venous congestion. CT of the abdomen and pelvis revealed  development of cirrhosis and advanced portal venous hypertension. Small left greater than right pleural effusions. Left base air space and groundglass opacity suspicious for infection. 2/25 patient admits that she drinks at least a sixpack of beer per night and then there are at times. States she understands she must completely stop drinking.   Objective: Filed Vitals:   08/10/13 1846 08/10/13 2105 08/11/13 0203 08/11/13 0531  BP: 158/76 118/67 147/79 137/76  Pulse: 90 88 89 87  Temp: 97.8 F (36.6 C) 98.3 F (36.8 C) 98.6 F (37 C) 98.4 F (36.9 C)  TempSrc: Oral Oral Oral Oral  Resp: 22 20 20 20   Height: 5\' 5"  (1.651 m)     Weight: 115.3 kg (254 lb 3.1 oz)     SpO2: 98% 96% 93% 91%    Intake/Output Summary (Last 24 hours) at 08/11/13 0835 Last data filed at 08/11/13 0250  Gross per 24 hour  Intake    240 ml  Output    900 ml  Net   -660 ml   Filed Weights   08/10/13 1846  Weight: 115.3 kg (254 lb 3.1 oz)    Exam:   General:  A./O. x4, NAD  Cardiovascular: Regular rhythm and rate, grade 2 systolic murmur, negative rubs or gallops, DP/PT pulse one plus bilateral  Respiratory: Clear to auscultation bilateral  Abdomen: Obese, soft, nontender, nondistended, plus bowel sounds  Musculoskeletal: Positive pedal edema 1+ midcalf bilateral   Data Reviewed: Basic Metabolic Panel:  Recent Labs Lab 08/10/13 1435 08/11/13 0523  NA 139 141  K 3.6* 3.4*  CL 102 104  CO2 27 30  GLUCOSE 106* 99  BUN 6 7  CREATININE 0.55 0.61  CALCIUM 8.4 8.0*   Liver Function Tests:  Recent Labs Lab 08/10/13 1435  08/11/13 0523  AST 89* 65*  ALT 39* 30  ALKPHOS 139* 112  BILITOT 5.0* 4.6*  PROT 8.6* 7.1  ALBUMIN 2.5* 2.1*    Recent Labs Lab 08/10/13 1435  LIPASE 69*   No results found for this basename: AMMONIA,  in the last 168 hours CBC:  Recent Labs Lab 08/10/13 1435 08/11/13 0523  WBC 4.3 4.2  NEUTROABS 2.5  --   HGB 13.4 12.0  HCT 39.0 34.6*  MCV 108.9*  109.5*  PLT 52* 47*   Cardiac Enzymes:  Recent Labs Lab 08/10/13 1435  TROPONINI <0.30   BNP (last 3 results)  Recent Labs  08/10/13 1435  PROBNP 121.1   CBG: No results found for this basename: GLUCAP,  in the last 168 hours  No results found for this or any previous visit (from the past 240 hour(s)).   Studies: Ct Abdomen Pelvis W Contrast  08/10/2013   CLINICAL DATA:  Shortness of breath. Leg swelling. Hysterectomy. Abdominal pain.  EXAM: CT ABDOMEN AND PELVIS WITH CONTRAST  TECHNIQUE: Multidetector CT imaging of the abdomen and pelvis was performed using the standard protocol following bolus administration of intravenous contrast.  CONTRAST:  2mL OMNIPAQUE IOHEXOL 300 MG/ML SOLN, 172mL OMNIPAQUE IOHEXOL 300 MG/ML SOLN  COMPARISON:  CT ABDOMEN W/O CM dated 12/15/2001; DG CHEST 1V PORT dated 08/10/2013  FINDINGS: Lower Chest: Left base patchy airspace and ground-glass opacity laterally. Small left pleural effusion. Trace right pleural fluid. Mild cardiomegaly without pericardial effusion.  Abdomen/Pelvis: Development of moderate cirrhosis. No focal liver lesion. Splenic size upper normal. An inferior splenic low-density lesion is likely a cyst at 1.8 cm.  Normal stomach, pancreas, gallbladder, biliary tract, right adrenal gland. Left adrenal gland not well visualized. Punctate bilateral renal collecting system calculi. Aortic atherosclerosis.  Portal venous hypertension, as evidenced by extensive portosystemic collaterals.  Normal colon, appendix, and terminal ileum. Normal small bowel without abdominal ascites. No pelvic adenopathy. Normal urinary bladder. Hysterectomy. No adnexal mass. No significant free fluid.  Bones/Musculoskeletal: No acute osseous abnormality. Lumbar spondylosis/degenerative disc disease. Disc bulges at L4-5 and L2-3.  IMPRESSION: 1. Development of cirrhosis and advanced portal venous hypertension. 2. Bilateral nonobstructive nephrolithiasis. 3. Small left greater than  right pleural effusions. Left base airspace and ground-glass opacity, suspicious for infection. This is incompletely imaged.   Electronically Signed   By: Abigail Miyamoto M.D.   On: 08/10/2013 17:01   Dg Chest Portable 1 View  08/10/2013   CLINICAL DATA:  Cough.  Short of breath.  EXAM: PORTABLE CHEST - 1 VIEW  COMPARISON:  None.  FINDINGS: Cardiopericardial silhouette is within normal limits for projection. There is pulmonary vascular congestion. No focal consolidation. Exam is underpenetrated with opacity over both bases. Monitoring leads project over the chest.  IMPRESSION: Pulmonary vascular congestion. Consider departmental PA and lateral when patient condition permits.   Electronically Signed   By: Dereck Ligas M.D.   On: 08/10/2013 14:36    Scheduled Meds: . folic acid  1 mg Oral Daily  . furosemide  40 mg Oral Daily  . influenza vac split quadrivalent PF  0.5 mL Intramuscular Tomorrow-1000  . LORazepam  0-4 mg Oral Q6H   Followed by  . [START ON 08/12/2013] LORazepam  0-4 mg Oral Q12H  . multivitamin with minerals  1 tablet Oral Daily  . pneumococcal 23 valent vaccine  0.5 mL Intramuscular Tomorrow-1000  . sodium chloride  3 mL Intravenous Q12H  . thiamine  100 mg Oral Daily   Or  .  thiamine  100 mg Intravenous Daily   Continuous Infusions:   Principal Problem:   Alcoholic hepatitis Active Problems:   Cirrhosis   Portal venous hypertension   Thrombocytopenia   Alcohol dependence   Hypokalemia    Time spent: 40 minutes    Takiyah Bohnsack, J  [facility] Pager [phone]. If 7PM-7AM, please contact night-coverage at [email or web address], password [facility] 08/11/2013, 8:35 AM  LOS: 1 day

## 2013-08-11 NOTE — Progress Notes (Signed)
UR completed 

## 2013-08-11 NOTE — Progress Notes (Signed)
*  PRELIMINARY RESULTS* Echocardiogram 2D Echocardiogram has been performed.  [place], [place] 08/11/2013, 12:22 PM

## 2013-08-12 DIAGNOSIS — R894 Abnormal immunological findings in specimens from other organs, systems and tissues: Secondary | ICD-10-CM

## 2013-08-12 DIAGNOSIS — R768 Other specified abnormal immunological findings in serum: Secondary | ICD-10-CM | POA: Diagnosis present

## 2013-08-12 DIAGNOSIS — I509 Heart failure, unspecified: Secondary | ICD-10-CM

## 2013-08-12 DIAGNOSIS — I2789 Other specified pulmonary heart diseases: Secondary | ICD-10-CM

## 2013-08-12 DIAGNOSIS — I5030 Unspecified diastolic (congestive) heart failure: Secondary | ICD-10-CM | POA: Diagnosis present

## 2013-08-12 DIAGNOSIS — I272 Pulmonary hypertension, unspecified: Secondary | ICD-10-CM | POA: Diagnosis present

## 2013-08-12 DIAGNOSIS — K766 Portal hypertension: Secondary | ICD-10-CM

## 2013-08-12 LAB — PROTIME-INR
INR: 1.59 — AB (ref 0.00–1.49)
Prothrombin Time: 18.5 seconds — ABNORMAL HIGH (ref 11.6–15.2)

## 2013-08-12 LAB — HEPATITIS C ANTIBODY: HCV Ab: REACTIVE — AB

## 2013-08-12 LAB — HEPATITIS B SURFACE ANTIGEN: Hepatitis B Surface Ag: NEGATIVE

## 2013-08-12 LAB — AFP TUMOR MARKER: AFP-Tumor Marker: 13.4 ng/mL — ABNORMAL HIGH (ref 0.0–8.0)

## 2013-08-12 MED ORDER — FUROSEMIDE 40 MG PO TABS: 20.0000 mg | ORAL_TABLET | Freq: Every day | ORAL | Status: DC

## 2013-08-12 MED ORDER — CARVEDILOL 3.125 MG PO TABS: 3.1250 mg | ORAL_TABLET | Freq: Two times a day (BID) | ORAL | Status: DC

## 2013-08-12 MED ORDER — POTASSIUM CHLORIDE 20 MEQ PO PACK: 20.0000 meq | PACK | Freq: Every day | ORAL | Status: DC

## 2013-08-12 MED ORDER — CARVEDILOL 3.125 MG PO TABS
3.1250 mg | ORAL_TABLET | Freq: Two times a day (BID) | ORAL | Status: DC
  Administered 2013-08-12: 3.125 mg via ORAL
  Filled 2013-08-12: qty 1

## 2013-08-12 MED ORDER — IBUPROFEN 800 MG PO TABS
800.0000 mg | ORAL_TABLET | Freq: Once | ORAL | Status: AC
  Administered 2013-08-12: 800 mg via ORAL
  Filled 2013-08-12: qty 1

## 2013-08-12 NOTE — Progress Notes (Signed)
Subjective; Patient has no complaints. She states she does not like food without salt. She hasn't had bowel movement since yesterday evening. She denies abdominal pain nausea vomiting or shortness of breath. Patient anxious to go home today.  Objective; BP 120/61  Pulse 77  Temp(Src) 98.1 F (36.7 C) (Oral)  Resp 20  Ht 5\' 5"  (1.651 m)  Wt 251 lb 5.2 oz (114 kg)  BMI 41.82 kg/m2  SpO2 91% Patient is alert and does not have asterixis. He remains jaundiced. Abdomen is protuberant but soft and nontender without organomegaly or masses.  Lab data; INR 1.59; it was 1.43 two days ago. HCV antibody is positive. Hepatitis B surface antigen negative. HCVRNA by PCR pending. Echo results noted.  Assessment; #1. Alcoholic liver disease. Patient has alcoholic hepatitis in addition to cirrhosis. No evidence of ascites. LE edema/fluid overload has improved with diuretic therapy. #2. Positive hepatitis C virus antibody. HCVRNA is pending and if it is positive she would be candidate for antiviral therapy but she will need to quit drinking alcohol first. #3. Alcohol abuse. Rehabilitation therapy has been recommended. Patient seemed to be motivated not to drink again. If she does she will be back in the hospital very soon.  Recommendations; Agree with DC plans. Patient will be contacted when results of HCVRNA PCR available. Will arrange for office visit in  3-4 weeks.

## 2013-08-12 NOTE — Clinical Social Work Note (Signed)
CSW provided information on [facility] intensive outpatient program at MD and pt request. Pt appreciative.  Alyssa Pena, [place]

## 2013-08-12 NOTE — Discharge Summary (Signed)
Physician Discharge Summary  Alyssa Pena [id number] DOB: 06-28-1950 DOA: 08/10/2013  PCP: Royce Macadamia D., PA-C  Admit date: 08/10/2013 Discharge date: 08/12/2013  Time spent: 40 minutes   Recommendations for Outpatient Follow-up:   Alcoholic hepatitis (history IV drug use)  -Hepatitis C antibody test positive, obtain hepatitis C RNA by PCR   -HIV negative   -RPR negative  -Counseled patient at length today concerning her need to absolutely abstain from further alcohol use. Patient provided information for AA meetings and intensive outpatient substance abuse programs.  - Patient will require close followup with GI to complete hepatitis C. workup and monitor liver cirrhosis  Alcoholism  -No DTs observed during patient's stay stable for discharge -Consult nurse case manager for intensive outpatient substance abuse program([facility])  -  Diastolic CHF/pulmonary hypertension  -See alcoholism  -Start patient on low-dose Coreg, will need to followup with PCP to titrate to effect -Continue Lasix at a reduced dose of 20 mg daily, PCP to titrate to effect  Hypokalemia  -Continue K-Dur 73meq daily PCP to titrate to effect  Thrombocytopenia  -Most likely alcohol induced, continue to monitor      Discharge Diagnoses:  Principal Problem:   Alcoholic hepatitis Active Problems:   Cirrhosis   Portal venous hypertension   Thrombocytopenia   Alcohol dependence   Hypokalemia   IV drug user history   Hepatitis C antibody test positive   Diastolic CHF   Pulmonary hypertension   Discharge Condition: Stable  Diet recommendation: Heart healthy  Filed Weights   08/10/13 1846 08/11/13 2053 08/12/13 0437  Weight: 115.3 kg (254 lb 3.1 oz) 110.9 kg (244 lb 7.8 oz) 114 kg (251 lb 5.2 oz)    History of present illness:  63yo WF PMHx alcoholism, portal venous hypertension, cirrhosis, alcoholic hepatitis,  x-year-old woman presents the emergency room with increasing shortness of breath  the last 2 weeks. Screening evaluation was notable for grossly abnormal hepatic function panel, CT of the abdomen and pelvis revealed new diagnosis of cirrhosis and the patient was referred for further evaluation.  Patient reports increasing dyspnea on exertion and orthopnea over the last 2 weeks. She does have baseline orthopnea, sleeping on 3 pillows for quite some time. However she has had increased difficulty sleeping lately. She has previously been on Lasix but has not taken it for a month. No chest pain. No cardiac history. She has chronic lower extremity edema which she reports is without change. No specific aggravating or alleviating factors. She does report drinking 6 cans of beer per day.  In the emergency department afebrile, vital signs stable. No hypoxia. Hepatic function panel total bilirubin 5.0, ALT 39, AST 89, lipase 69, alkaline phosphatase 139.troponin negative. Platelet count 52. Chest x-ray pulmonary venous congestion. CT of the abdomen and pelvis revealed development of cirrhosis and advanced portal venous hypertension. Small left greater than right pleural effusions. Left base air space and groundglass opacity suspicious for infection. 2/25 patient admits that she drinks at least a sixpack of beer per night and then there are at times. States she understands she must completely stop drinking 2/26 no acute events overnight stable for discharge     Consultants:  Dr. Hildred Laser (GI)   Procedures:  08/11/2013 echocardiogram -Left ventricle: moderate LVH.  -LVEF= 60% to 65%.  - (grade 1 diastolic dysfunction). Doppler parameters are consistent with high ventricular filling pressure. - Aortic valve: Mildly increased transvalvular velocities and gradients, but valve was poorly visualized. Possible minimal aortic stenosis. Peak velocity: 256cm/s (  S). Mean gradient: 12mm Hg (S). - Mitral valve: Mild to moderate regurgitation. - Left atrium: The atrium was mildly dilated. -  Pulmonary arteries: PA peak pressure: 47mm Hg (S). Mildly elevated pulmonary pressures.     Antibiotics:    Discharge Exam: Filed Vitals:   08/11/13 2053 08/11/13 2105 08/12/13 0437 08/12/13 1450  BP:  146/44 135/79 120/61  Pulse:  81 88 77  Temp:  98.4 F (36.9 C) 98.1 F (36.7 C) 98.1 F (36.7 C)  TempSrc:  Oral Oral Oral  Resp:  20 20 20   Height:      Weight: 110.9 kg (244 lb 7.8 oz)  114 kg (251 lb 5.2 oz)   SpO2:  95% 93% 91%   General: A./O. x4, NAD  Cardiovascular: Regular rhythm and rate, grade 2 systolic murmur, negative rubs or gallops, DP/PT pulse one plus bilateral  Respiratory: Clear to auscultation bilateral  Abdomen: Obese, soft, nontender, nondistended, plus bowel sounds  Musculoskeletal: Positive pedal edema 1+ midcalf bilateral    Discharge Instructions     Medication List    STOP taking these medications       guaifenesin 100 MG/5ML syrup  Commonly known as:  ROBITUSSIN     lisinopril 20 MG tablet  Commonly known as:  PRINIVIL,ZESTRIL      TAKE these medications       carvedilol 3.125 MG tablet  Commonly known as:  COREG  Take 1 tablet (3.125 mg total) by mouth 2 (two) times daily with a meal.     fluticasone 110 MCG/ACT inhaler  Commonly known as:  FLOVENT HFA  Inhale 1 puff into the lungs 2 (two) times daily as needed (shortness of breath.).     furosemide 40 MG tablet  Commonly known as:  LASIX  Take 0.5 tablets (20 mg total) by mouth daily.     potassium chloride 20 MEQ packet  Commonly known as:  KLOR-CON  Take 20 mEq by mouth daily.       Allergies  Allergen Reactions  . Penicillins Hives   Follow-up Information   Follow up with MUSE,ROCHELLE D., PA-C. Schedule an appointment as soon as possible for a visit in 1 week. [facility] followup, obtain BMP, titrate Coreg and potassium to effect)    Contact information:   [address] [place]  [address] [phone]       Follow up with REHMAN,NAJEEB U, MD.  Schedule an appointment as soon as possible for a visit in 2 weeks. [facility] followup continue workup of hepatitis C positive antibody screen)    Specialty:  Gastroenterology   Contact information:   [place], [place] [address] [phone]        The results of significant diagnostics from this hospitalization (including imaging, microbiology, ancillary and laboratory) are listed below for reference.    Significant Diagnostic Studies: Ct Abdomen Pelvis W Contrast  08/10/2013   CLINICAL DATA:  Shortness of breath. Leg swelling. Hysterectomy. Abdominal pain.  EXAM: CT ABDOMEN AND PELVIS WITH CONTRAST  TECHNIQUE: Multidetector CT imaging of the abdomen and pelvis was performed using the standard protocol following bolus administration of intravenous contrast.  CONTRAST:  74mL OMNIPAQUE IOHEXOL 300 MG/ML SOLN, 121mL OMNIPAQUE IOHEXOL 300 MG/ML SOLN  COMPARISON:  CT ABDOMEN W/O CM dated 12/15/2001; DG CHEST 1V PORT dated 08/10/2013  FINDINGS: Lower Chest: Left base patchy airspace and ground-glass opacity laterally. Small left pleural effusion. Trace right pleural fluid. Mild cardiomegaly without pericardial effusion.  Abdomen/Pelvis:  Development of moderate cirrhosis. No focal liver lesion. Splenic size upper normal. An inferior splenic low-density lesion is likely a cyst at 1.8 cm.  Normal stomach, pancreas, gallbladder, biliary tract, right adrenal gland. Left adrenal gland not well visualized. Punctate bilateral renal collecting system calculi. Aortic atherosclerosis.  Portal venous hypertension, as evidenced by extensive portosystemic collaterals.  Normal colon, appendix, and terminal ileum. Normal small bowel without abdominal ascites. No pelvic adenopathy. Normal urinary bladder. Hysterectomy. No adnexal mass. No significant free fluid.  Bones/Musculoskeletal: No acute osseous abnormality. Lumbar spondylosis/degenerative disc disease. Disc bulges at L4-5 and L2-3.  IMPRESSION: 1.  Development of cirrhosis and advanced portal venous hypertension. 2. Bilateral nonobstructive nephrolithiasis. 3. Small left greater than right pleural effusions. Left base airspace and ground-glass opacity, suspicious for infection. This is incompletely imaged.   Electronically Signed   By: Abigail Miyamoto M.D.   On: 08/10/2013 17:01   Dg Chest Portable 1 View  08/10/2013   CLINICAL DATA:  Cough.  Short of breath.  EXAM: PORTABLE CHEST - 1 VIEW  COMPARISON:  None.  FINDINGS: Cardiopericardial silhouette is within normal limits for projection. There is pulmonary vascular congestion. No focal consolidation. Exam is underpenetrated with opacity over both bases. Monitoring leads project over the chest.  IMPRESSION: Pulmonary vascular congestion. Consider departmental PA and lateral when patient condition permits.   Electronically Signed   By: Dereck Ligas M.D.   On: 08/10/2013 14:36    Microbiology: No results found for this or any previous visit (from the past 240 hour(s)).   Labs: Basic Metabolic Panel:  Recent Labs Lab 08/10/13 1435 08/11/13 0523 08/11/13 0849  NA 139 141  --   K 3.6* 3.4*  --   CL 102 104  --   CO2 27 30  --   GLUCOSE 106* 99  --   BUN 6 7  --   CREATININE 0.55 0.61  --   CALCIUM 8.4 8.0*  --   MG  --   --  1.6   Liver Function Tests:  Recent Labs Lab 08/10/13 1435 08/11/13 0523  AST 89* 65*  ALT 39* 30  ALKPHOS 139* 112  BILITOT 5.0* 4.6*  PROT 8.6* 7.1  ALBUMIN 2.5* 2.1*    Recent Labs Lab 08/10/13 1435  LIPASE 69*   No results found for this basename: AMMONIA,  in the last 168 hours CBC:  Recent Labs Lab 08/10/13 1435 08/11/13 0523  WBC 4.3 4.2  NEUTROABS 2.5  --   HGB 13.4 12.0  HCT 39.0 34.6*  MCV 108.9* 109.5*  PLT 52* 47*   Cardiac Enzymes:  Recent Labs Lab 08/10/13 1435  TROPONINI <0.30   BNP: BNP (last 3 results)  Recent Labs  08/10/13 1435  PROBNP 121.1   CBG: No results found for this basename: GLUCAP,  in the  last 168 hours     Signed:  Dia Crawford, MD [facility] [phone] pager

## 2013-08-12 NOTE — Progress Notes (Signed)
Pt discharged home today per Dr. Sherral Hammers. Pt's IV site D/C'd and WNL. Pt's VSS. Pt provided with home medication list, discharge instructions, and prescriptions. Verbalized understanding. Pt to leave floor via WC accompanied by NT.

## 2013-08-16 ENCOUNTER — Telehealth (INDEPENDENT_AMBULATORY_CARE_PROVIDER_SITE_OTHER): Payer: Self-pay | Admitting: *Deleted

## 2013-08-16 DIAGNOSIS — K7010 Alcoholic hepatitis without ascites: Secondary | ICD-10-CM

## 2013-08-16 DIAGNOSIS — K7200 Acute and subacute hepatic failure without coma: Secondary | ICD-10-CM

## 2013-08-16 DIAGNOSIS — R772 Abnormality of alphafetoprotein: Secondary | ICD-10-CM

## 2013-08-16 LAB — HEPATITIS C VRS RNA DETECT BY PCR-QUAL: Hepatitis C Vrs RNA by PCR-Qual: POSITIVE — AB

## 2013-08-16 NOTE — Telephone Encounter (Signed)
Per Dr.Rehman the patient will need to have labs drawn in 3 months 

## 2013-09-10 ENCOUNTER — Telehealth (INDEPENDENT_AMBULATORY_CARE_PROVIDER_SITE_OTHER): Payer: Self-pay | Admitting: *Deleted

## 2013-09-10 NOTE — Telephone Encounter (Signed)
Dr. Laural Golden requested an OV the end of the month (March) or next month. Spoke with Alyssa Pena and she is doing fine. Alyssa Pena has no money or insurance at this time. She does not wish to schedule an apt but will call back if needed.

## 2013-09-10 NOTE — Telephone Encounter (Signed)
Patient has alcoholic cirrhosis and needs to be seen. It is her decision not to come for followup

## 2013-09-10 NOTE — Telephone Encounter (Signed)
Forwarded to [place] as an Pharmacist, hospital.

## 2013-11-10 ENCOUNTER — Other Ambulatory Visit (INDEPENDENT_AMBULATORY_CARE_PROVIDER_SITE_OTHER): Payer: Self-pay | Admitting: *Deleted

## 2013-11-10 ENCOUNTER — Encounter (INDEPENDENT_AMBULATORY_CARE_PROVIDER_SITE_OTHER): Payer: Self-pay | Admitting: *Deleted

## 2013-11-10 DIAGNOSIS — K7010 Alcoholic hepatitis without ascites: Secondary | ICD-10-CM

## 2013-11-10 DIAGNOSIS — R772 Abnormality of alphafetoprotein: Secondary | ICD-10-CM

## 2013-11-10 DIAGNOSIS — K7200 Acute and subacute hepatic failure without coma: Secondary | ICD-10-CM

## 2016-07-01 DIAGNOSIS — I5031 Acute diastolic (congestive) heart failure: Secondary | ICD-10-CM | POA: Diagnosis not present

## 2016-07-01 DIAGNOSIS — I10 Essential (primary) hypertension: Secondary | ICD-10-CM | POA: Diagnosis not present

## 2016-07-01 DIAGNOSIS — K7030 Alcoholic cirrhosis of liver without ascites: Secondary | ICD-10-CM | POA: Diagnosis not present

## 2016-07-01 DIAGNOSIS — B182 Chronic viral hepatitis C: Secondary | ICD-10-CM | POA: Diagnosis not present

## 2016-07-08 DIAGNOSIS — Z1231 Encounter for screening mammogram for malignant neoplasm of breast: Secondary | ICD-10-CM | POA: Diagnosis not present

## 2016-07-08 DIAGNOSIS — R928 Other abnormal and inconclusive findings on diagnostic imaging of breast: Secondary | ICD-10-CM | POA: Diagnosis not present

## 2016-08-22 DIAGNOSIS — H25811 Combined forms of age-related cataract, right eye: Secondary | ICD-10-CM | POA: Diagnosis not present

## 2016-08-22 DIAGNOSIS — H2522 Age-related cataract, morgagnian type, left eye: Secondary | ICD-10-CM | POA: Diagnosis not present

## 2016-08-23 DIAGNOSIS — H2522 Age-related cataract, morgagnian type, left eye: Secondary | ICD-10-CM | POA: Diagnosis not present

## 2016-08-27 ENCOUNTER — Encounter (HOSPITAL_COMMUNITY): Admission: RE | Admit: 2016-08-27 | Payer: Medicare Other | Source: Ambulatory Visit

## 2016-08-29 ENCOUNTER — Encounter (HOSPITAL_COMMUNITY)
Admission: RE | Admit: 2016-08-29 | Discharge: 2016-08-29 | Disposition: A | Discharge status: Home / Self Care | Payer: Medicare Other | Source: Ambulatory Visit | Attending: Ophthalmology | Admitting: Ophthalmology

## 2016-08-29 ENCOUNTER — Encounter (HOSPITAL_COMMUNITY): Payer: Self-pay

## 2016-08-29 DIAGNOSIS — H259 Unspecified age-related cataract: Secondary | ICD-10-CM | POA: Diagnosis not present

## 2016-08-29 DIAGNOSIS — I110 Hypertensive heart disease with heart failure: Secondary | ICD-10-CM | POA: Diagnosis not present

## 2016-08-29 DIAGNOSIS — Z880 Allergy status to penicillin: Secondary | ICD-10-CM | POA: Diagnosis not present

## 2016-08-29 DIAGNOSIS — H269 Unspecified cataract: Secondary | ICD-10-CM | POA: Diagnosis present

## 2016-08-29 DIAGNOSIS — K7460 Unspecified cirrhosis of liver: Secondary | ICD-10-CM | POA: Diagnosis not present

## 2016-08-29 DIAGNOSIS — I509 Heart failure, unspecified: Secondary | ICD-10-CM | POA: Diagnosis not present

## 2016-08-29 DIAGNOSIS — Z87891 Personal history of nicotine dependence: Secondary | ICD-10-CM | POA: Diagnosis not present

## 2016-08-29 DIAGNOSIS — Z79899 Other long term (current) drug therapy: Secondary | ICD-10-CM | POA: Diagnosis not present

## 2016-08-29 LAB — BASIC METABOLIC PANEL WITH GFR
Anion gap: 7 (ref 5–15)
BUN: 11 mg/dL (ref 6–20)
CO2: 26 mmol/L (ref 22–32)
Calcium: 8.7 mg/dL — ABNORMAL LOW (ref 8.9–10.3)
Chloride: 106 mmol/L (ref 101–111)
Creatinine, Ser: 0.79 mg/dL (ref 0.44–1.00)
GFR calc Af Amer: >60 mL/min
GFR calc non Af Amer: >60 mL/min
Glucose, Bld: 108 mg/dL — ABNORMAL HIGH (ref 65–99)
Potassium: 3.5 mmol/L (ref 3.5–5.1)
Sodium: 139 mmol/L (ref 135–145)

## 2016-08-29 LAB — CBC WITH DIFFERENTIAL/PLATELET
Basophils Absolute: 0 K/uL (ref 0.0–0.1)
Basophils Relative: 1 %
Eosinophils Absolute: 0.1 K/uL (ref 0.0–0.7)
Eosinophils Relative: 2 %
HCT: 40.4 % (ref 36.0–46.0)
Hemoglobin: 14.4 g/dL (ref 12.0–15.0)
Lymphocytes Relative: 34 %
Lymphs Abs: 1.4 K/uL (ref 0.7–4.0)
MCH: 36.5 pg — ABNORMAL HIGH (ref 26.0–34.0)
MCHC: 35.6 g/dL (ref 30.0–36.0)
MCV: 102.5 fL — ABNORMAL HIGH (ref 78.0–100.0)
Monocytes Absolute: 0.7 K/uL (ref 0.1–1.0)
Monocytes Relative: 17 %
Neutro Abs: 1.9 K/uL (ref 1.7–7.7)
Neutrophils Relative %: 47 %
Platelets: 65 K/uL — ABNORMAL LOW (ref 150–400)
RBC: 3.94 MIL/uL (ref 3.87–5.11)
RDW: 13.6 % (ref 11.5–15.5)
WBC: 4 K/uL (ref 4.0–10.5)

## 2016-08-29 NOTE — Patient Instructions (Signed)
Your procedure is scheduled on:  08/30/2016  Report to [facility] at   940   AM.  Call this number if you have problems the morning of surgery: [phone]   Do not eat food or drink liquids :After Midnight.      Take these medicines the morning of surgery with A SIP OF WATER: Coreg   Do not wear jewelry, make-up or nail polish.  Do not wear lotions, powders, or perfumes. You may wear deodorant.  Do not shave 48 hours prior to surgery.  Do not bring valuables to the hospital.  Contacts, dentures or bridgework may not be worn into surgery.  Leave suitcase in the car. After surgery it may be brought to your room.  For patients admitted to the hospital, checkout time is 11:00 AM the day of discharge.   Patients discharged the day of surgery will not be allowed to drive home.  :     Please read over the following fact sheets that you were given: Coughing and Deep Breathing, Surgical Site Infection Prevention, Anesthesia Post-op Instructions and Care and Recovery After Surgery    Cataract A cataract is a clouding of the lens of the eye. When a lens becomes cloudy, vision is reduced based on the degree and nature of the clouding. Many cataracts reduce vision to some degree. Some cataracts make people more near-sighted as they develop. Other cataracts increase glare. Cataracts that are ignored and become worse can sometimes look white. The white color can be seen through the pupil. CAUSES   Aging. However, cataracts may occur at any age, even in newborns.   Certain drugs.   Trauma to the eye.   Certain diseases such as diabetes.   Specific eye diseases such as chronic inflammation inside the eye or a sudden attack of a rare form of glaucoma.   Inherited or acquired medical problems.  SYMPTOMS   Gradual, progressive drop in vision in the affected eye.   Severe, rapid visual loss. This most often happens when trauma is the cause.  DIAGNOSIS  To detect a cataract, an eye doctor  examines the lens. Cataracts are best diagnosed with an exam of the eyes with the pupils enlarged (dilated) by drops.  TREATMENT  For an early cataract, vision may improve by using different eyeglasses or stronger lighting. If that does not help your vision, surgery is the only effective treatment. A cataract needs to be surgically removed when vision loss interferes with your everyday activities, such as driving, reading, or watching TV. A cataract may also have to be removed if it prevents examination or treatment of another eye problem. Surgery removes the cloudy lens and usually replaces it with a substitute lens (intraocular lens, IOL).  At a time when both you and your doctor agree, the cataract will be surgically removed. If you have cataracts in both eyes, only one is usually removed at a time. This allows the operated eye to heal and be out of danger from any possible problems after surgery (such as infection or poor wound healing). In rare cases, a cataract may be doing damage to your eye. In these cases, your caregiver may advise surgical removal right away. The vast majority of people who have cataract surgery have better vision afterward. HOME CARE INSTRUCTIONS  If you are not planning surgery, you may be asked to do the following:  Use different eyeglasses.   Use stronger or brighter lighting.   Ask your eye doctor about reducing your  medicine dose or changing medicines if it is thought that a medicine caused your cataract. Changing medicines does not make the cataract go away on its own.   Become familiar with your surroundings. Poor vision can lead to injury. Avoid bumping into things on the affected side. You are at a higher risk for tripping or falling.   Exercise extreme care when driving or operating machinery.   Wear sunglasses if you are sensitive to bright light or experiencing problems with glare.  SEEK IMMEDIATE MEDICAL CARE IF:   You have a worsening or sudden vision  loss.   You notice redness, swelling, or increasing pain in the eye.   You have a fever.  Document Released: 06/03/2005 Document Revised: 05/23/2011 Document Reviewed: 01/25/2011 [facility] Patient Information 2012 [employer].PATIENT INSTRUCTIONS POST-ANESTHESIA  IMMEDIATELY FOLLOWING SURGERY:  Do not drive or operate machinery for the first twenty four hours after surgery.  Do not make any important decisions for twenty four hours after surgery or while taking narcotic pain medications or sedatives.  If you develop intractable nausea and vomiting or a severe headache please notify your doctor immediately.  FOLLOW-UP:  Please make an appointment with your surgeon as instructed. You do not need to follow up with anesthesia unless specifically instructed to do so.  WOUND CARE INSTRUCTIONS (if applicable):  Keep a dry clean dressing on the anesthesia/puncture wound site if there is drainage.  Once the wound has quit draining you may leave it open to air.  Generally you should leave the bandage intact for twenty four hours unless there is drainage.  If the epidural site drains for more than 36-48 hours please call the anesthesia department.  QUESTIONS?:  Please feel free to call your physician or the hospital operator if you have any questions, and they will be happy to assist you.

## 2016-08-30 ENCOUNTER — Ambulatory Visit (HOSPITAL_COMMUNITY): Payer: Medicare Other | Admitting: Anesthesiology

## 2016-08-30 ENCOUNTER — Encounter (HOSPITAL_COMMUNITY)
Admission: RE | Disposition: A | Discharge status: Home / Self Care | Payer: Self-pay | Source: Ambulatory Visit | Attending: Ophthalmology

## 2016-08-30 ENCOUNTER — Encounter (HOSPITAL_COMMUNITY): Payer: Self-pay | Admitting: *Deleted

## 2016-08-30 ENCOUNTER — Ambulatory Visit (HOSPITAL_COMMUNITY)
Admission: RE | Admit: 2016-08-30 | Discharge: 2016-08-30 | Disposition: A | Discharge status: Home / Self Care | Payer: Medicare Other | Source: Ambulatory Visit | Attending: Ophthalmology | Admitting: Ophthalmology

## 2016-08-30 DIAGNOSIS — Z79899 Other long term (current) drug therapy: Secondary | ICD-10-CM | POA: Insufficient documentation

## 2016-08-30 DIAGNOSIS — H2522 Age-related cataract, morgagnian type, left eye: Secondary | ICD-10-CM | POA: Diagnosis not present

## 2016-08-30 DIAGNOSIS — I110 Hypertensive heart disease with heart failure: Secondary | ICD-10-CM | POA: Insufficient documentation

## 2016-08-30 DIAGNOSIS — H259 Unspecified age-related cataract: Secondary | ICD-10-CM | POA: Diagnosis not present

## 2016-08-30 DIAGNOSIS — K7460 Unspecified cirrhosis of liver: Secondary | ICD-10-CM | POA: Insufficient documentation

## 2016-08-30 DIAGNOSIS — Z880 Allergy status to penicillin: Secondary | ICD-10-CM | POA: Insufficient documentation

## 2016-08-30 DIAGNOSIS — Z87891 Personal history of nicotine dependence: Secondary | ICD-10-CM | POA: Insufficient documentation

## 2016-08-30 DIAGNOSIS — I509 Heart failure, unspecified: Secondary | ICD-10-CM | POA: Diagnosis not present

## 2016-08-30 DIAGNOSIS — H269 Unspecified cataract: Secondary | ICD-10-CM | POA: Diagnosis not present

## 2016-08-30 HISTORY — PX: CATARACT EXTRACTION W/PHACO: SHX586

## 2016-08-30 SURGERY — PHACOEMULSIFICATION, CATARACT, WITH IOL INSERTION
Anesthesia: Monitor Anesthesia Care | Site: Eye | Laterality: Left

## 2016-08-30 MED ORDER — EPINEPHRINE PF 1 MG/ML IJ SOLN
INTRAMUSCULAR | Status: AC
  Filled 2016-08-30: qty 1

## 2016-08-30 MED ORDER — PHENYLEPHRINE HCL 2.5 % OP SOLN
1.0000 [drp] | OPHTHALMIC | Status: AC
  Administered 2016-08-30 (×3): 1 [drp] via OPHTHALMIC

## 2016-08-30 MED ORDER — MIDAZOLAM HCL 2 MG/2ML IJ SOLN
1.0000 mg | INTRAMUSCULAR | Status: AC
  Administered 2016-08-30: 2 mg via INTRAVENOUS

## 2016-08-30 MED ORDER — TRYPAN BLUE 0.06 % OP SOLN
OPHTHALMIC | Status: DC | PRN
  Administered 2016-08-30: 0.5 mL via INTRAOCULAR

## 2016-08-30 MED ORDER — TRYPAN BLUE 0.06 % OP SOLN
OPHTHALMIC | Status: AC
  Filled 2016-08-30: qty 0.5

## 2016-08-30 MED ORDER — PROVISC 10 MG/ML IO SOLN
INTRAOCULAR | Status: DC | PRN
  Administered 2016-08-30: 0.85 mL via INTRAOCULAR

## 2016-08-30 MED ORDER — NEOMYCIN-POLYMYXIN-DEXAMETH 3.5-10000-0.1 OP SUSP
OPHTHALMIC | Status: DC | PRN
  Administered 2016-08-30: 2 [drp] via OPHTHALMIC

## 2016-08-30 MED ORDER — BSS IO SOLN
INTRAOCULAR | Status: DC | PRN
  Administered 2016-08-30: 500 mL

## 2016-08-30 MED ORDER — FENTANYL CITRATE (PF) 100 MCG/2ML IJ SOLN
INTRAMUSCULAR | Status: AC
  Filled 2016-08-30: qty 2

## 2016-08-30 MED ORDER — FENTANYL CITRATE (PF) 100 MCG/2ML IJ SOLN
25.0000 ug | INTRAMUSCULAR | Status: AC
  Administered 2016-08-30: 25 ug via INTRAVENOUS

## 2016-08-30 MED ORDER — SODIUM HYALURONATE 23 MG/ML IO SOLN
INTRAOCULAR | Status: DC | PRN
  Administered 2016-08-30: 0.6 mL via INTRAOCULAR

## 2016-08-30 MED ORDER — TETRACAINE HCL 0.5 % OP SOLN
1.0000 [drp] | OPHTHALMIC | Status: AC | PRN
  Administered 2016-08-30 (×3): 1 [drp] via OPHTHALMIC

## 2016-08-30 MED ORDER — LIDOCAINE HCL 3.5 % OP GEL
1.0000 "application " | Freq: Once | OPHTHALMIC | Status: AC
  Administered 2016-08-30: 1 via OPHTHALMIC

## 2016-08-30 MED ORDER — BSS IO SOLN
INTRAOCULAR | Status: DC | PRN
Start: 2016-08-30 — End: 2016-08-30
  Administered 2016-08-30: 15 mL via INTRAOCULAR

## 2016-08-30 MED ORDER — POVIDONE-IODINE 5 % OP SOLN
OPHTHALMIC | Status: DC | PRN
  Administered 2016-08-30: 1 via OPHTHALMIC

## 2016-08-30 MED ORDER — LIDOCAINE HCL (PF) 1 % IJ SOLN
INTRAOCULAR | Status: DC | PRN
  Administered 2016-08-30: .9 mL via OPHTHALMIC

## 2016-08-30 MED ORDER — MIDAZOLAM HCL 2 MG/2ML IJ SOLN
INTRAMUSCULAR | Status: AC
  Filled 2016-08-30: qty 2

## 2016-08-30 MED ORDER — CYCLOPENTOLATE-PHENYLEPHRINE 0.2-1 % OP SOLN
1.0000 [drp] | OPHTHALMIC | Status: AC
  Administered 2016-08-30 (×3): 1 [drp] via OPHTHALMIC

## 2016-08-30 MED ORDER — LACTATED RINGERS IV SOLN
INTRAVENOUS | Status: DC
  Administered 2016-08-30: 11:00:00 via INTRAVENOUS

## 2016-08-30 SURGICAL SUPPLY — 19 items
CLOTH BEACON ORANGE TIMEOUT ST (SAFETY) ×2 IMPLANT
EYE SHIELD UNIVERSAL CLEAR (GAUZE/BANDAGES/DRESSINGS) ×2 IMPLANT
GLOVE BIOGEL PI IND STRL 6.5 (GLOVE) IMPLANT
GLOVE BIOGEL PI IND STRL 7.0 (GLOVE) IMPLANT
GLOVE BIOGEL PI IND STRL 7.5 (GLOVE) IMPLANT
GLOVE BIOGEL PI INDICATOR 6.5 (GLOVE) ×2
GLOVE BIOGEL PI INDICATOR 7.0 (GLOVE) ×2
GLOVE BIOGEL PI INDICATOR 7.5 (GLOVE)
GLOVE EXAM NITRILE LRG STRL (GLOVE) ×2 IMPLANT
GLOVE EXAM NITRILE MD LF STRL (GLOVE) IMPLANT
NDL HYPO 18GX1.5 BLUNT FILL (NEEDLE) IMPLANT
NEEDLE HYPO 18GX1.5 BLUNT FILL (NEEDLE) ×3 IMPLANT
PAD ARMBOARD 7.5X6 YLW CONV (MISCELLANEOUS) ×2 IMPLANT
RING MALYGIN (MISCELLANEOUS) IMPLANT
SIGHTPATH CAT PROC W REG LENS (Ophthalmic Related) ×2 IMPLANT
SYR TB 1ML LL NO SAFETY (SYRINGE) ×2 IMPLANT
TAPE TRANSPARENT 1/2IN (GAUZE/BANDAGES/DRESSINGS) ×2 IMPLANT
VISCOELASTIC ADDITIONAL (OPHTHALMIC RELATED) IMPLANT
WATER STERILE IRR 250ML POUR (IV SOLUTION) ×2 IMPLANT

## 2016-08-30 NOTE — Transfer of Care (Signed)
Immediate Anesthesia Transfer of Care Note  Patient: Alyssa Pena  Procedure(s) Performed: Procedure(s) with comments: CATARACT EXTRACTION PHACOEMULSIFICATION AND INTRAOCULAR LENS PLACEMENT LEFT EYE CDE - 46.93 (Left) - left  Patient Location: Short Stay  Anesthesia Type:MAC  Level of Consciousness: awake  Airway & Oxygen Therapy: Patient Spontanous Breathing  Post-op Assessment: Report given to RN  Post vital signs: Reviewed  Last Vitals:  Vitals:   08/30/16 1045 08/30/16 1050  BP: (!) 107/43 (!) 113/53  Pulse:    Resp: 16 16  Temp:      Last Pain:  Vitals:   08/30/16 1007  TempSrc: Oral  PainSc: 6          Complications: No apparent anesthesia complications

## 2016-08-30 NOTE — Anesthesia Postprocedure Evaluation (Signed)
Anesthesia Post Note  Patient: Alyssa Pena  Procedure(s) Performed: Procedure(s) (LRB): CATARACT EXTRACTION PHACOEMULSIFICATION AND INTRAOCULAR LENS PLACEMENT LEFT EYE CDE - 46.93 (Left)  Patient location during evaluation: Short Stay Anesthesia Type: MAC Level of consciousness: awake and alert and oriented Pain management: pain level controlled Vital Signs Assessment: post-procedure vital signs reviewed and stable Respiratory status: spontaneous breathing Cardiovascular status: blood pressure returned to baseline and stable Postop Assessment: no signs of nausea or vomiting Anesthetic complications: no     Last Vitals:  Vitals:   08/30/16 1045 08/30/16 1050  BP: (!) 107/43 (!) 113/53  Pulse:    Resp: 16 16  Temp:      Last Pain:  Vitals:   08/30/16 1007  TempSrc: Oral  PainSc: 6                  Shakala Marlatt

## 2016-08-30 NOTE — Op Note (Signed)
Date of procedure:   Pre-operative diagnosis: Mature, Visually significant cataract, Left Eye  Post-operative diagnosis: Mature Visually significant cataract, Left Eye  Procedure: Complex Removal of cataract via phacoemulsification and insertion of intra-ocular lens AMO PCB00 +D into the capsular bag of the Left Eye  Attending surgeon: Gerda Diss. Special Ranes, MD, MA  Anesthesia: MAC, Topical Akten  Complications: None  Estimated Blood Loss: <33m (minimal)  Specimens: None  Implants: As above  Indications:  Visually significant cataract, Left Eye  Procedure:  The patient was seen and identified in the pre-operative area. The operative eye was identified and dilated.  The operative eye was marked.  Topical anesthesia was administered to the operative eye.     The patient was then to the operative suite and placed in the supine position.  A timeout was performed confirming the patient, procedure to be performed, and all other relevant information.   The patient's face was prepped and draped in the usual fashion for intra-ocular surgery.  A lid speculum was placed into the operative eye and the surgical microscope moved into place and focused.  A lack of red reflex due to a mature cataract was confirmed.  A superotemporal paracentesis was created using a 20 gauge paracentesis blade.  Vision blue was injected into the anterior chamber.  Shugarcaine was injected into the anterior chamber.  Viscoelastic was injected into the anterior chamber.  A temporal clear-corneal main wound incision was created using a 2.418mmicrokeratome.  A continuous curvilinear capsulorrhexis was initiated using an irrigating cystitome and completed using capsulorrhexis forceps.  Hydrodissection and hydrodeliniation were performed.  Viscoelastic was injected into the anterior chamber.  A phacoemulsification handpiece and a chopper as a second instrument were used to remove the nucleus and epinucleus. The irrigation/aspiration  handpiece was used to remove any remaining cortical material.   The capsular bag was reinflated with viscoelastic, checked, and found to be intact. The intraocular lens was inserted into the capsular bag and dialed into place using a kuglen hook.  The irrigation/aspiration handpiece was used to remove any remaining viscoelastic.  The clear corneal wound and paracentesis wounds were then hydrated and checked with Weck-Cels to be watertight.  The lid-speculum and drape was removed, and the patient's face was cleaned with a wet and dry 4x4.  Maxitrol was instilled in the eye before a clear shield was taped over the eye. The patient was taken to the post-operative care unit in good condition, having tolerated the procedure well.  Post-Op Instructions: [facility] and will receive all other orders and instructions.

## 2016-08-30 NOTE — Anesthesia Preprocedure Evaluation (Signed)
Anesthesia Evaluation  Patient identified by MRN, date of birth, ID band Patient awake    Reviewed: Allergy & Precautions, NPO status , Patient's Chart, lab work & pertinent test results  Airway Mallampati: II  TM Distance: >3 FB     Dental  (+) Edentulous Upper   Pulmonary former smoker,    breath sounds clear to auscultation       Cardiovascular hypertension, Pt. on medications +CHF   Rhythm:Regular Rate:Normal     Neuro/Psych PSYCHIATRIC DISORDERS (alcohollism)    GI/Hepatic (+) Cirrhosis     substance abuse  alcohol use, Hepatitis -  Endo/Other    Renal/GU      Musculoskeletal   Abdominal   Peds  Hematology   Anesthesia Other Findings   Reproductive/Obstetrics                             Anesthesia Physical Anesthesia Plan  ASA: III  Anesthesia Plan: MAC   Post-op Pain Management:    Induction: Intravenous  Airway Management Planned: Nasal Cannula  Additional Equipment:   Intra-op Plan:   Post-operative Plan:   Informed Consent: I have reviewed the patients History and Physical, chart, labs and discussed the procedure including the risks, benefits and alternatives for the proposed anesthesia with the patient or authorized representative who has indicated his/her understanding and acceptance.     Plan Discussed with:   Anesthesia Plan Comments:         Anesthesia Quick Evaluation

## 2016-08-30 NOTE — H&P (Signed)
The H and P was reviewed and updated. The patient was examined.  No changes were found after exam.  The surgical eye was marked.  

## 2016-08-30 NOTE — Discharge Instructions (Signed)
Please discharge patient when stable, will follow up today with Dr. Marisa Hua at the [facility] office at 12:15PM.  Leave shield in place until visit.  All paperwork with discharge instructions will be given at the office.      PATIENT INSTRUCTIONS POST-ANESTHESIA  IMMEDIATELY FOLLOWING SURGERY:  Do not drive or operate machinery for the first twenty four hours after surgery.  Do not make any important decisions for twenty four hours after surgery or while taking narcotic pain medications or sedatives.  If you develop intractable nausea and vomiting or a severe headache please notify your doctor immediately.  FOLLOW-UP:  Please make an appointment with your surgeon as instructed. You do not need to follow up with anesthesia unless specifically instructed to do so.  WOUND CARE INSTRUCTIONS (if applicable):  Keep a dry clean dressing on the anesthesia/puncture wound site if there is drainage.  Once the wound has quit draining you may leave it open to air.  Generally you should leave the bandage intact for twenty four hours unless there is drainage.  If the epidural site drains for more than 36-48 hours please call the anesthesia department.  QUESTIONS?:  Please feel free to call your physician or the hospital operator if you have any questions, and they will be happy to assist you.

## 2016-09-02 ENCOUNTER — Encounter (HOSPITAL_COMMUNITY): Payer: Self-pay | Admitting: Ophthalmology

## 2016-09-18 DIAGNOSIS — N6489 Other specified disorders of breast: Secondary | ICD-10-CM | POA: Diagnosis not present

## 2016-09-18 DIAGNOSIS — R928 Other abnormal and inconclusive findings on diagnostic imaging of breast: Secondary | ICD-10-CM | POA: Diagnosis not present

## 2016-09-18 DIAGNOSIS — N6313 Unspecified lump in the right breast, lower outer quadrant: Secondary | ICD-10-CM | POA: Diagnosis not present

## 2016-09-18 DIAGNOSIS — N6314 Unspecified lump in the right breast, lower inner quadrant: Secondary | ICD-10-CM | POA: Diagnosis not present

## 2016-09-30 DIAGNOSIS — I10 Essential (primary) hypertension: Secondary | ICD-10-CM | POA: Diagnosis not present

## 2016-09-30 DIAGNOSIS — B182 Chronic viral hepatitis C: Secondary | ICD-10-CM | POA: Diagnosis not present

## 2016-09-30 DIAGNOSIS — Z0000 Encounter for general adult medical examination without abnormal findings: Secondary | ICD-10-CM | POA: Diagnosis not present

## 2016-09-30 DIAGNOSIS — Z131 Encounter for screening for diabetes mellitus: Secondary | ICD-10-CM | POA: Diagnosis not present

## 2016-09-30 DIAGNOSIS — K7030 Alcoholic cirrhosis of liver without ascites: Secondary | ICD-10-CM | POA: Diagnosis not present

## 2016-09-30 DIAGNOSIS — I5031 Acute diastolic (congestive) heart failure: Secondary | ICD-10-CM | POA: Diagnosis not present

## 2016-12-31 DIAGNOSIS — B182 Chronic viral hepatitis C: Secondary | ICD-10-CM | POA: Diagnosis not present

## 2016-12-31 DIAGNOSIS — I5031 Acute diastolic (congestive) heart failure: Secondary | ICD-10-CM | POA: Diagnosis not present

## 2016-12-31 DIAGNOSIS — I10 Essential (primary) hypertension: Secondary | ICD-10-CM | POA: Diagnosis not present

## 2016-12-31 DIAGNOSIS — K7030 Alcoholic cirrhosis of liver without ascites: Secondary | ICD-10-CM | POA: Diagnosis not present

## 2017-03-26 DIAGNOSIS — N6314 Unspecified lump in the right breast, lower inner quadrant: Secondary | ICD-10-CM | POA: Diagnosis not present

## 2017-03-26 DIAGNOSIS — N6313 Unspecified lump in the right breast, lower outer quadrant: Secondary | ICD-10-CM | POA: Diagnosis not present

## 2017-03-27 DIAGNOSIS — H1852 Epithelial (juvenile) corneal dystrophy: Secondary | ICD-10-CM | POA: Diagnosis not present

## 2017-04-01 DIAGNOSIS — K7030 Alcoholic cirrhosis of liver without ascites: Secondary | ICD-10-CM | POA: Diagnosis not present

## 2017-04-01 DIAGNOSIS — I5031 Acute diastolic (congestive) heart failure: Secondary | ICD-10-CM | POA: Diagnosis not present

## 2017-04-01 DIAGNOSIS — I10 Essential (primary) hypertension: Secondary | ICD-10-CM | POA: Diagnosis not present

## 2017-04-07 DIAGNOSIS — H1852 Epithelial (juvenile) corneal dystrophy: Secondary | ICD-10-CM | POA: Diagnosis not present

## 2017-05-29 DIAGNOSIS — H25811 Combined forms of age-related cataract, right eye: Secondary | ICD-10-CM | POA: Diagnosis not present

## 2017-05-29 DIAGNOSIS — Z9842 Cataract extraction status, left eye: Secondary | ICD-10-CM | POA: Diagnosis not present

## 2017-06-16 ENCOUNTER — Encounter (HOSPITAL_COMMUNITY)
Admission: RE | Admit: 2017-06-16 | Discharge: 2017-06-16 | Disposition: A | Discharge status: Home / Self Care | Payer: Medicare Other | Source: Ambulatory Visit | Attending: Ophthalmology | Admitting: Ophthalmology

## 2017-06-16 DIAGNOSIS — H25811 Combined forms of age-related cataract, right eye: Secondary | ICD-10-CM | POA: Diagnosis not present

## 2017-06-16 NOTE — Pre-Procedure Instructions (Signed)
Labs reviewed with Dr Patsey Berthold from 08/29/2016. Ok to use these labs for surgery.

## 2017-06-20 ENCOUNTER — Ambulatory Visit (HOSPITAL_COMMUNITY): Payer: Medicare Other | Admitting: Anesthesiology

## 2017-06-20 ENCOUNTER — Encounter (HOSPITAL_COMMUNITY)
Admission: RE | Disposition: A | Discharge status: Home / Self Care | Payer: Self-pay | Source: Ambulatory Visit | Attending: Ophthalmology

## 2017-06-20 ENCOUNTER — Ambulatory Visit (HOSPITAL_COMMUNITY)
Admission: RE | Admit: 2017-06-20 | Discharge: 2017-06-20 | Disposition: A | Discharge status: Home / Self Care | Payer: Medicare Other | Source: Ambulatory Visit | Attending: Ophthalmology | Admitting: Ophthalmology

## 2017-06-20 ENCOUNTER — Encounter (HOSPITAL_COMMUNITY): Payer: Self-pay | Admitting: *Deleted

## 2017-06-20 DIAGNOSIS — H2511 Age-related nuclear cataract, right eye: Secondary | ICD-10-CM | POA: Diagnosis not present

## 2017-06-20 DIAGNOSIS — Z87891 Personal history of nicotine dependence: Secondary | ICD-10-CM | POA: Insufficient documentation

## 2017-06-20 DIAGNOSIS — I110 Hypertensive heart disease with heart failure: Secondary | ICD-10-CM | POA: Diagnosis not present

## 2017-06-20 DIAGNOSIS — Z79899 Other long term (current) drug therapy: Secondary | ICD-10-CM | POA: Diagnosis not present

## 2017-06-20 DIAGNOSIS — F1010 Alcohol abuse, uncomplicated: Secondary | ICD-10-CM | POA: Diagnosis not present

## 2017-06-20 DIAGNOSIS — H269 Unspecified cataract: Secondary | ICD-10-CM | POA: Diagnosis present

## 2017-06-20 DIAGNOSIS — Z880 Allergy status to penicillin: Secondary | ICD-10-CM | POA: Diagnosis not present

## 2017-06-20 DIAGNOSIS — K7030 Alcoholic cirrhosis of liver without ascites: Secondary | ICD-10-CM | POA: Diagnosis not present

## 2017-06-20 DIAGNOSIS — I509 Heart failure, unspecified: Secondary | ICD-10-CM | POA: Diagnosis not present

## 2017-06-20 DIAGNOSIS — H25811 Combined forms of age-related cataract, right eye: Secondary | ICD-10-CM | POA: Diagnosis not present

## 2017-06-20 HISTORY — PX: CATARACT EXTRACTION W/PHACO: SHX586

## 2017-06-20 SURGERY — PHACOEMULSIFICATION, CATARACT, WITH IOL INSERTION
Anesthesia: Monitor Anesthesia Care | Laterality: Right

## 2017-06-20 MED ORDER — LACTATED RINGERS IV SOLN
INTRAVENOUS | Status: DC
  Administered 2017-06-20: 08:00:00 via INTRAVENOUS

## 2017-06-20 MED ORDER — LIDOCAINE HCL (PF) 1 % IJ SOLN
INTRAOCULAR | Status: DC | PRN
  Administered 2017-06-20: 1 mL via OPHTHALMIC

## 2017-06-20 MED ORDER — BSS IO SOLN
INTRAOCULAR | Status: DC | PRN
Start: 2017-06-20 — End: 2017-06-20
  Administered 2017-06-20: 15 mL

## 2017-06-20 MED ORDER — TETRACAINE HCL 0.5 % OP SOLN
1.0000 [drp] | OPHTHALMIC | Status: AC
  Administered 2017-06-20 (×3): 1 [drp] via OPHTHALMIC

## 2017-06-20 MED ORDER — PROVISC 10 MG/ML IO SOLN
INTRAOCULAR | Status: DC | PRN
  Administered 2017-06-20: 0.85 mL via INTRAOCULAR

## 2017-06-20 MED ORDER — SODIUM HYALURONATE 23 MG/ML IO SOLN
INTRAOCULAR | Status: DC | PRN
  Administered 2017-06-20: 0.6 mL via INTRAOCULAR

## 2017-06-20 MED ORDER — MIDAZOLAM HCL 2 MG/2ML IJ SOLN
1.0000 mg | INTRAMUSCULAR | Status: AC
  Administered 2017-06-20: 2 mg via INTRAVENOUS

## 2017-06-20 MED ORDER — NEOMYCIN-POLYMYXIN-DEXAMETH 3.5-10000-0.1 OP SUSP
OPHTHALMIC | Status: DC | PRN
  Administered 2017-06-20: 2 [drp] via OPHTHALMIC

## 2017-06-20 MED ORDER — CYCLOPENTOLATE-PHENYLEPHRINE 0.2-1 % OP SOLN
1.0000 [drp] | OPHTHALMIC | Status: AC
  Administered 2017-06-20 (×3): 1 [drp] via OPHTHALMIC

## 2017-06-20 MED ORDER — PHENYLEPHRINE HCL 2.5 % OP SOLN
1.0000 [drp] | OPHTHALMIC | Status: AC
  Administered 2017-06-20 (×3): 1 [drp] via OPHTHALMIC

## 2017-06-20 MED ORDER — LIDOCAINE HCL 3.5 % OP GEL
1.0000 "application " | Freq: Once | OPHTHALMIC | Status: AC
  Administered 2017-06-20: 1 via OPHTHALMIC

## 2017-06-20 MED ORDER — POVIDONE-IODINE 5 % OP SOLN
OPHTHALMIC | Status: DC | PRN
  Administered 2017-06-20: 1 via OPHTHALMIC

## 2017-06-20 MED ORDER — MIDAZOLAM HCL 2 MG/2ML IJ SOLN
INTRAMUSCULAR | Status: AC
  Filled 2017-06-20: qty 2

## 2017-06-20 MED ORDER — EPINEPHRINE PF 1 MG/ML IJ SOLN
INTRAOCULAR | Status: DC | PRN
  Administered 2017-06-20: 500 mL

## 2017-06-20 MED ORDER — EPINEPHRINE PF 1 MG/ML IJ SOLN
INTRAMUSCULAR | Status: AC
  Filled 2017-06-20: qty 2

## 2017-06-20 MED ORDER — FENTANYL CITRATE (PF) 100 MCG/2ML IJ SOLN
INTRAMUSCULAR | Status: AC
  Filled 2017-06-20: qty 2

## 2017-06-20 MED ORDER — FENTANYL CITRATE (PF) 100 MCG/2ML IJ SOLN
25.0000 ug | Freq: Once | INTRAMUSCULAR | Status: AC
  Administered 2017-06-20: 25 ug via INTRAVENOUS

## 2017-06-20 SURGICAL SUPPLY — 18 items
CLOTH BEACON ORANGE TIMEOUT ST (SAFETY) ×2 IMPLANT
EYE SHIELD UNIVERSAL CLEAR (GAUZE/BANDAGES/DRESSINGS) ×2 IMPLANT
GLOVE BIOGEL PI IND STRL 6.5 (GLOVE) IMPLANT
GLOVE BIOGEL PI IND STRL 7.0 (GLOVE) IMPLANT
GLOVE BIOGEL PI IND STRL 7.5 (GLOVE) IMPLANT
GLOVE BIOGEL PI INDICATOR 6.5 (GLOVE) ×4
GLOVE BIOGEL PI INDICATOR 7.0 (GLOVE) ×2
GLOVE BIOGEL PI INDICATOR 7.5 (GLOVE) ×2
GLOVE EXAM NITRILE LRG STRL (GLOVE) IMPLANT
GLOVE EXAM NITRILE MD LF STRL (GLOVE) IMPLANT
LENS ALC ACRYL/TECN (Ophthalmic Related) ×2 IMPLANT
NDL HYPO 18GX1.5 BLUNT FILL (NEEDLE) IMPLANT
NEEDLE HYPO 18GX1.5 BLUNT FILL (NEEDLE) ×3 IMPLANT
PAD ARMBOARD 7.5X6 YLW CONV (MISCELLANEOUS) ×2 IMPLANT
RING MALYGIN (MISCELLANEOUS) IMPLANT
SYR TB 1ML LL NO SAFETY (SYRINGE) ×2 IMPLANT
VISCOELASTIC ADDITIONAL (OPHTHALMIC RELATED) ×2 IMPLANT
WATER STERILE IRR 250ML POUR (IV SOLUTION) ×2 IMPLANT

## 2017-06-20 NOTE — Discharge Instructions (Signed)
Please discharge patient when stable, will [facility] up today [facility] Kal[facility] immediately following discharge.  Leave shield in place until visit.  All paperwork with discharge instructions will be given at the office. ° °

## 2017-06-20 NOTE — Anesthesia Procedure Notes (Signed)
Procedure Name: MAC Date/Time: 06/20/2017 8:48 AM Performed by: Andree Elk Amy A, CRNA Pre-anesthesia Checklist: Patient identified, Timeout performed, Emergency Drugs available, Suction available and Patient being monitored Oxygen Delivery Method: Nasal cannula

## 2017-06-20 NOTE — Op Note (Signed)
Date of procedure: 06/20/17  Pre-operative diagnosis: Visually significant cataract, Right Eye  Post-operative diagnosis: Visually significant cataract, Right Eye  Procedure: Removal of cataract via phacoemulsification and insertion of intra-ocular lens AMO PCB00  +22.0D into the capsular bag of the Right Eye  Attending surgeon: Gerda Diss. Marquitta Persichetti, MD, MA  Anesthesia: MAC, Topical Akten  Complications: None  Estimated Blood Loss: <20m (minimal)  Specimens: None  Implants: As above  Indications:  Visually significant cataract, Right Eye  Procedure:  The patient was seen and identified in the pre-operative area. The operative eye was identified and dilated.  The operative eye was marked.  Topical anesthesia was administered to the operative eye.     The patient was then to the operative suite and placed in the supine position.  A timeout was performed confirming the patient, procedure to be performed, and all other relevant information.   The patient's face was prepped and draped in the usual fashion for intra-ocular surgery.  A lid speculum was placed into the operative eye and the surgical microscope moved into place and focused.  A superotemporal paracentesis was created using a 20 gauge paracentesis blade.  Shugarcaine was injected into the anterior chamber.  Viscoelastic was injected into the anterior chamber.  A temporal clear-corneal main wound incision was created using a 2.445mmicrokeratome.  A continuous curvilinear capsulorrhexis was initiated using an irrigating cystitome and completed using capsulorrhexis forceps.  Hydrodissection and hydrodeliniation were performed.  Viscoelastic was injected into the anterior chamber.  A phacoemulsification handpiece and a chopper as a second instrument were used to remove the nucleus and epinucleus. The irrigation/aspiration handpiece was used to remove any remaining cortical material.   The capsular bag was reinflated with viscoelastic,  checked, and found to be intact.  The intraocular lens was inserted into the capsular bag and dialed into place using a Kuglen hook.  The irrigation/aspiration handpiece was used to remove any remaining viscoelastic.  The clear corneal wound and paracentesis wounds were then hydrated and checked with Weck-Cels to be watertight.  The lid-speculum and drape was removed, and the patient's face was cleaned with a wet and dry 4x4.  Maxitrol was instilled in the eye before a clear shield was taped over the eye. The patient was taken to the post-operative care unit in good condition, having tolerated the procedure well.  Post-Op Instructions: [facility] and will receive all other orders and instructions.

## 2017-06-20 NOTE — Transfer of Care (Signed)
Immediate Anesthesia Transfer of Care Note  Patient: Alyssa Pena  Procedure(s) Performed: CATARACT EXTRACTION PHACO AND INTRAOCULAR LENS PLACEMENT RIGHT EYE (Right )  Patient Location: Short Stay  Anesthesia Type:MAC  Level of Consciousness: awake, alert , oriented and patient cooperative  Airway & Oxygen Therapy: Patient Spontanous Breathing  Post-op Assessment: Report given to RN and Post -op Vital signs reviewed and stable  Post vital signs: Reviewed and stable  Last Vitals:  Vitals:   06/20/17 0840 06/20/17 0845  BP:  129/61  Resp: 10 11  Temp:    SpO2: 98% 98%    Last Pain: There were no vitals filed for this visit.       Complications: No apparent anesthesia complications

## 2017-06-20 NOTE — Anesthesia Postprocedure Evaluation (Signed)
Anesthesia Post Note  Patient: Alyssa Pena  Procedure(s) Performed: CATARACT EXTRACTION PHACO AND INTRAOCULAR LENS PLACEMENT RIGHT EYE (Right )  Patient location during evaluation: Short Stay Anesthesia Type: MAC Level of consciousness: awake and alert, oriented and patient cooperative Pain management: pain level controlled Vital Signs Assessment: post-procedure vital signs reviewed and stable Respiratory status: spontaneous breathing Cardiovascular status: stable Postop Assessment: no apparent nausea or vomiting Anesthetic complications: no     Last Vitals:  Vitals:   06/20/17 0840 06/20/17 0845  BP:  129/61  Resp: 10 11  Temp:    SpO2: 98% 98%    Last Pain: There were no vitals filed for this visit.               ADAMS, AMY A

## 2017-06-20 NOTE — Anesthesia Preprocedure Evaluation (Signed)
Anesthesia Evaluation  Patient identified by MRN, date of birth, ID band Patient awake    Reviewed: Allergy & Precautions, NPO status , Patient's Chart, lab work & pertinent test results  Airway Mallampati: II  TM Distance: >3 FB     Dental  (+) Edentulous Upper   Pulmonary former smoker,    breath sounds clear to auscultation       Cardiovascular hypertension, Pt. on medications +CHF   Rhythm:Regular Rate:Normal     Neuro/Psych PSYCHIATRIC DISORDERS (alcohollism)    GI/Hepatic (+) Cirrhosis     substance abuse  alcohol use, Hepatitis -  Endo/Other    Renal/GU      Musculoskeletal   Abdominal   Peds  Hematology   Anesthesia Other Findings   Reproductive/Obstetrics                             Anesthesia Physical Anesthesia Plan  ASA: III  Anesthesia Plan: MAC   Post-op Pain Management:    Induction: Intravenous  PONV Risk Score and Plan:   Airway Management Planned: Nasal Cannula  Additional Equipment:   Intra-op Plan:   Post-operative Plan:   Informed Consent: I have reviewed the patients History and Physical, chart, labs and discussed the procedure including the risks, benefits and alternatives for the proposed anesthesia with the patient or authorized representative who has indicated his/her understanding and acceptance.     Plan Discussed with:   Anesthesia Plan Comments:         Anesthesia Quick Evaluation

## 2017-06-20 NOTE — H&P (Signed)
The H and P was reviewed and updated. The patient was examined.  No changes were found after exam.  The surgical eye was marked.  

## 2017-06-23 ENCOUNTER — Encounter (HOSPITAL_COMMUNITY): Payer: Self-pay | Admitting: Ophthalmology

## 2017-07-03 DIAGNOSIS — I10 Essential (primary) hypertension: Secondary | ICD-10-CM | POA: Diagnosis not present

## 2017-07-03 DIAGNOSIS — K7030 Alcoholic cirrhosis of liver without ascites: Secondary | ICD-10-CM | POA: Diagnosis not present

## 2017-07-03 DIAGNOSIS — I5031 Acute diastolic (congestive) heart failure: Secondary | ICD-10-CM | POA: Diagnosis not present

## 2017-07-04 DIAGNOSIS — I10 Essential (primary) hypertension: Secondary | ICD-10-CM | POA: Diagnosis not present

## 2017-07-04 DIAGNOSIS — K7030 Alcoholic cirrhosis of liver without ascites: Secondary | ICD-10-CM | POA: Diagnosis not present

## 2017-07-04 DIAGNOSIS — I5031 Acute diastolic (congestive) heart failure: Secondary | ICD-10-CM | POA: Diagnosis not present

## 2017-09-24 DIAGNOSIS — N6313 Unspecified lump in the right breast, lower outer quadrant: Secondary | ICD-10-CM | POA: Diagnosis not present

## 2017-09-24 DIAGNOSIS — R928 Other abnormal and inconclusive findings on diagnostic imaging of breast: Secondary | ICD-10-CM | POA: Diagnosis not present

## 2017-09-24 DIAGNOSIS — N6314 Unspecified lump in the right breast, lower inner quadrant: Secondary | ICD-10-CM | POA: Diagnosis not present

## 2017-10-01 DIAGNOSIS — I10 Essential (primary) hypertension: Secondary | ICD-10-CM | POA: Diagnosis not present

## 2017-10-01 DIAGNOSIS — Z0000 Encounter for general adult medical examination without abnormal findings: Secondary | ICD-10-CM | POA: Diagnosis not present

## 2017-10-01 DIAGNOSIS — K7030 Alcoholic cirrhosis of liver without ascites: Secondary | ICD-10-CM | POA: Diagnosis not present

## 2017-10-01 DIAGNOSIS — I5031 Acute diastolic (congestive) heart failure: Secondary | ICD-10-CM | POA: Diagnosis not present

## 2017-12-31 DIAGNOSIS — Z79899 Other long term (current) drug therapy: Secondary | ICD-10-CM | POA: Diagnosis not present

## 2017-12-31 DIAGNOSIS — H81393 Other peripheral vertigo, bilateral: Secondary | ICD-10-CM | POA: Diagnosis not present

## 2017-12-31 DIAGNOSIS — E559 Vitamin D deficiency, unspecified: Secondary | ICD-10-CM | POA: Diagnosis not present

## 2017-12-31 DIAGNOSIS — I10 Essential (primary) hypertension: Secondary | ICD-10-CM | POA: Diagnosis not present

## 2017-12-31 DIAGNOSIS — Z0000 Encounter for general adult medical examination without abnormal findings: Secondary | ICD-10-CM | POA: Diagnosis not present

## 2017-12-31 DIAGNOSIS — K7030 Alcoholic cirrhosis of liver without ascites: Secondary | ICD-10-CM | POA: Diagnosis not present

## 2017-12-31 DIAGNOSIS — I5031 Acute diastolic (congestive) heart failure: Secondary | ICD-10-CM | POA: Diagnosis not present

## 2018-02-10 ENCOUNTER — Ambulatory Visit (INDEPENDENT_AMBULATORY_CARE_PROVIDER_SITE_OTHER): Payer: Medicare Other | Admitting: Orthopaedic Surgery

## 2018-02-10 ENCOUNTER — Encounter: Payer: Self-pay | Admitting: Orthopaedic Surgery

## 2018-02-10 ENCOUNTER — Ambulatory Visit (INDEPENDENT_AMBULATORY_CARE_PROVIDER_SITE_OTHER): Payer: Medicare Other

## 2018-02-10 VITALS — BP 121/62 | HR 53 | Ht 65.0 in | Wt 184.0 lb

## 2018-02-10 DIAGNOSIS — G8929 Other chronic pain: Secondary | ICD-10-CM | POA: Diagnosis not present

## 2018-02-10 DIAGNOSIS — M25561 Pain in right knee: Secondary | ICD-10-CM | POA: Diagnosis not present

## 2018-02-10 NOTE — Progress Notes (Signed)
Subjective:    Patient ID: Alyssa Pena, female    DOB: 1951/04/02, 67 y.o.   MRN: [id number]  HPI She fell and hurt her right knee in June of 2018.  It took several months for it to get better.  She fell in June of this year and hurt the right knee again.  She has been having pain since then and it is more medially.  She has a large residual ecchymosis of the anterior leg.  She has swelling of the knee with tendency to give way.  She has no redness.  She has used ice and Advil with some help.    She has cirrohis of the liver and history of thrombocytopenia.     Review of Systems  Constitutional: Positive for activity change.  Musculoskeletal: Positive for arthralgias, gait problem and joint swelling.   For Review of Systems, all other systems reviewed and are negative.  The following is a summary of the past history medically, past history surgically, known current medicines, social history and family history.  This information is gathered electronically by the computer from prior information and documentation.  I review this each visit and have found including this information at this point in the chart is beneficial and informative.   Past Medical History:  Diagnosis Date  . Alcohol dependence ([place]) 08/10/2013  . Alcoholic hepatitis 4/74/2595  . Cirrhosis ([place]) 08/10/2013  . Fluid retention   . Hypertension   . Portal venous hypertension ([place]) 08/10/2013  . Thrombocytopenia ([place]) 08/10/2013    Past Surgical History:  Procedure Laterality Date  . ABDOMINAL HYSTERECTOMY    . CATARACT EXTRACTION W/PHACO Left 08/30/2016   Procedure: CATARACT EXTRACTION PHACOEMULSIFICATION AND INTRAOCULAR LENS PLACEMENT LEFT EYE CDE - 46.93;  Surgeon: Baruch Goldmann, MD;  Location: [facility];  Service: Ophthalmology;  Laterality: Left;  left  . CATARACT EXTRACTION W/PHACO Right 06/20/2017   Procedure: CATARACT EXTRACTION PHACO AND INTRAOCULAR LENS PLACEMENT RIGHT EYE;  Surgeon: Baruch Goldmann, MD;  Location:  [facility];  Service: Ophthalmology;  Laterality: Right;  right  . KIDNEY STONE SURGERY      Current Outpatient Medications on File Prior to Visit  Medication Sig Dispense Refill  . Biotin w/ Vitamins C & E (HAIR/SKIN/NAILS PO) Take 1 tablet by mouth daily.    . carvedilol (COREG) 3.125 MG tablet Take 1 tablet (3.125 mg total) by mouth 2 (two) times daily with a meal. (Patient taking differently: Take 6.25 mg by mouth 2 (two) times daily with a meal. ) 60 tablet 0  . FLUoxetine (PROZAC) 20 MG capsule Take 20 mg by mouth daily.    Marland Kitchen LORazepam (ATIVAN) 1 MG tablet Take 1 mg by mouth 2 (two) times daily.     No current facility-administered medications on file prior to visit.     Social History   Socioeconomic History  . Marital status: Widowed    Spouse name: Not on file  . Number of children: Not on file  . Years of education: Not on file  . Highest education level: Not on file  Occupational History  . Not on file  Social Needs  . Financial resource strain: Not on file  . Food insecurity:    Worry: Not on file    Inability: Not on file  . Transportation needs:    Medical: Not on file    Non-medical: Not on file  Tobacco Use  . Smoking status: Former Research scientist (life sciences)  . Smokeless tobacco: Never Used  Substance and  Sexual Activity  . Alcohol use: Yes    Comment: daily  . Drug use: No  . Sexual activity: Yes    Birth control/protection: Surgical  Lifestyle  . Physical activity:    Days per week: Not on file    Minutes per session: Not on file  . Stress: Not on file  Relationships  . Social connections:    Talks on phone: Not on file    Gets together: Not on file    Attends religious service: Not on file    Active member of club or organization: Not on file    Attends meetings of clubs or organizations: Not on file    Relationship status: Not on file  . Intimate partner violence:    Fear of current or ex partner: Not on file    Emotionally abused: Not on file    Physically  abused: Not on file    Forced sexual activity: Not on file  Other Topics Concern  . Not on file  Social History Narrative  . Not on file    Family History  Problem Relation Age of Onset  . Lung cancer Father   . Lung cancer Brother   . COPD Brother   . Dementia Mother   . Arthritis Sister   . High blood pressure Sister   . COPD Sister     BP 121/62   Pulse (!) 53   Ht 5\' 5"  (1.651 m)   Wt 184 lb (83.5 kg)   BMI 30.62 kg/m   Body mass index is 30.62 kg/m.      Objective:   Physical Exam  Constitutional: She is oriented to person, place, and time. She appears well-developed and well-nourished.  HENT:  Head: Normocephalic and atraumatic.  Eyes: Pupils are equal, round, and reactive to light. Conjunctivae and EOM are normal.  Neck: Normal range of motion. Neck supple.  Cardiovascular: Normal rate, regular rhythm and intact distal pulses.  Pulmonary/Chest: Effort normal.  Abdominal: Soft.  Musculoskeletal:       Right knee: She exhibits decreased range of motion, swelling and effusion. Tenderness found. Medial joint line tenderness noted.       Legs: Neurological: She is alert and oriented to person, place, and time. She has normal reflexes. She displays normal reflexes. No cranial nerve deficit. She exhibits normal muscle tone. Coordination normal.  Skin: Skin is warm and dry.  Psychiatric: She has a normal mood and affect. Her behavior is normal. Judgment and thought content normal.   X-rays were done, reported separately.       Assessment & Plan:   Encounter Diagnosis  Name Primary?  . Chronic pain of right knee Yes   PROCEDURE NOTE:  The patient requests injections of the right knee , verbal consent was obtained.  The right knee was prepped appropriately after time out was performed.   Sterile technique was observed and injection of 1 cc of Depo-Medrol 40 mg with several cc's of plain xylocaine. Anesthesia was provided by ethyl chloride and a 20-gauge  needle was used to inject the knee area. The injection was tolerated well.  A band aid dressing was applied.  The patient was advised to apply ice later today and tomorrow to the injection sight as needed.  I will see her back in two weeks.  She may need MRI.  Call if any problem.  Precautions discussed.   Electronically Signed Sanjuana Kava, MD 8/27/20193:01 PM

## 2018-02-24 ENCOUNTER — Ambulatory Visit: Payer: Medicare Other | Admitting: Orthopaedic Surgery

## 2018-04-08 DIAGNOSIS — K7030 Alcoholic cirrhosis of liver without ascites: Secondary | ICD-10-CM | POA: Diagnosis not present

## 2018-04-08 DIAGNOSIS — I10 Essential (primary) hypertension: Secondary | ICD-10-CM | POA: Diagnosis not present

## 2018-04-08 DIAGNOSIS — I5031 Acute diastolic (congestive) heart failure: Secondary | ICD-10-CM | POA: Diagnosis not present

## 2018-07-09 DIAGNOSIS — I10 Essential (primary) hypertension: Secondary | ICD-10-CM | POA: Diagnosis not present

## 2018-07-09 DIAGNOSIS — I5031 Acute diastolic (congestive) heart failure: Secondary | ICD-10-CM | POA: Diagnosis not present

## 2018-07-09 DIAGNOSIS — I209 Angina pectoris, unspecified: Secondary | ICD-10-CM | POA: Diagnosis not present

## 2018-07-09 DIAGNOSIS — K7030 Alcoholic cirrhosis of liver without ascites: Secondary | ICD-10-CM | POA: Diagnosis not present

## 2018-10-15 ENCOUNTER — Other Ambulatory Visit: Payer: Self-pay

## 2018-10-15 ENCOUNTER — Inpatient Hospital Stay (HOSPITAL_COMMUNITY)
Admission: EM | Admit: 2018-10-15 | Discharge: 2018-10-18 | DRG: 660 | Disposition: A | Discharge status: Home / Self Care | Payer: Medicare Other | Attending: Internal Medicine | Admitting: Internal Medicine

## 2018-10-15 ENCOUNTER — Emergency Department (HOSPITAL_COMMUNITY): Payer: Medicare Other

## 2018-10-15 DIAGNOSIS — N134 Hydroureter: Secondary | ICD-10-CM

## 2018-10-15 DIAGNOSIS — N390 Urinary tract infection, site not specified: Secondary | ICD-10-CM | POA: Diagnosis not present

## 2018-10-15 DIAGNOSIS — Z880 Allergy status to penicillin: Secondary | ICD-10-CM

## 2018-10-15 DIAGNOSIS — N179 Acute kidney failure, unspecified: Secondary | ICD-10-CM | POA: Diagnosis present

## 2018-10-15 DIAGNOSIS — N1330 Unspecified hydronephrosis: Secondary | ICD-10-CM

## 2018-10-15 DIAGNOSIS — F329 Major depressive disorder, single episode, unspecified: Secondary | ICD-10-CM | POA: Diagnosis present

## 2018-10-15 DIAGNOSIS — N132 Hydronephrosis with renal and ureteral calculous obstruction: Principal | ICD-10-CM | POA: Diagnosis present

## 2018-10-15 DIAGNOSIS — K7460 Unspecified cirrhosis of liver: Secondary | ICD-10-CM | POA: Diagnosis not present

## 2018-10-15 DIAGNOSIS — Z79899 Other long term (current) drug therapy: Secondary | ICD-10-CM

## 2018-10-15 DIAGNOSIS — N200 Calculus of kidney: Secondary | ICD-10-CM

## 2018-10-15 DIAGNOSIS — E872 Acidosis: Secondary | ICD-10-CM | POA: Diagnosis not present

## 2018-10-15 DIAGNOSIS — E669 Obesity, unspecified: Secondary | ICD-10-CM | POA: Diagnosis present

## 2018-10-15 DIAGNOSIS — I10 Essential (primary) hypertension: Secondary | ICD-10-CM | POA: Diagnosis not present

## 2018-10-15 DIAGNOSIS — Z87442 Personal history of urinary calculi: Secondary | ICD-10-CM

## 2018-10-15 DIAGNOSIS — F419 Anxiety disorder, unspecified: Secondary | ICD-10-CM | POA: Diagnosis present

## 2018-10-15 DIAGNOSIS — R188 Other ascites: Secondary | ICD-10-CM | POA: Diagnosis present

## 2018-10-15 DIAGNOSIS — N201 Calculus of ureter: Secondary | ICD-10-CM

## 2018-10-15 DIAGNOSIS — Z87891 Personal history of nicotine dependence: Secondary | ICD-10-CM

## 2018-10-15 DIAGNOSIS — R1031 Right lower quadrant pain: Secondary | ICD-10-CM | POA: Diagnosis not present

## 2018-10-15 DIAGNOSIS — Z87440 Personal history of urinary (tract) infections: Secondary | ICD-10-CM

## 2018-10-15 DIAGNOSIS — R300 Dysuria: Secondary | ICD-10-CM | POA: Diagnosis not present

## 2018-10-15 DIAGNOSIS — Z6829 Body mass index (BMI) 29.0-29.9, adult: Secondary | ICD-10-CM

## 2018-10-15 LAB — URINALYSIS, MICROSCOPIC (REFLEX)

## 2018-10-15 LAB — URINALYSIS, ROUTINE W REFLEX MICROSCOPIC

## 2018-10-15 IMAGING — CT CT RENAL STONE PROTOCOL
2 of 4 series · 15 of 46 positions shown, 17 images · non-contrast
Comparison: CT Abdomen and Pelvis [DATE].

CLINICAL DATA: 68-year-old female with right flank pain.

EXAM:
CT ABDOMEN AND PELVIS WITHOUT CONTRAST
TECHNIQUE: Multidetector CT imaging of the abdomen and pelvis was performed
following the standard protocol without IV contrast.

[Series 2: axial st · axial · 0.95mm/px · z∈[-217,+143]mm · 12 of 84 slices shown, 14 images]
[im 6/84  soft-tissue]
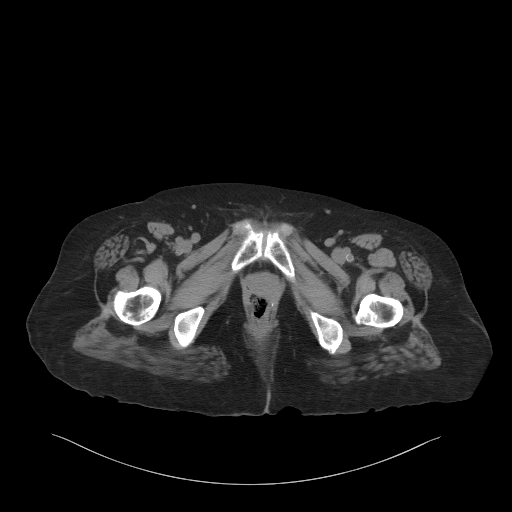
[im 6/84  bone]
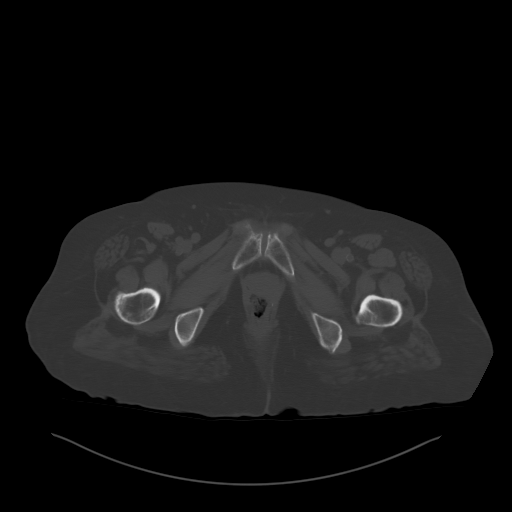
[im 11/84  soft-tissue]
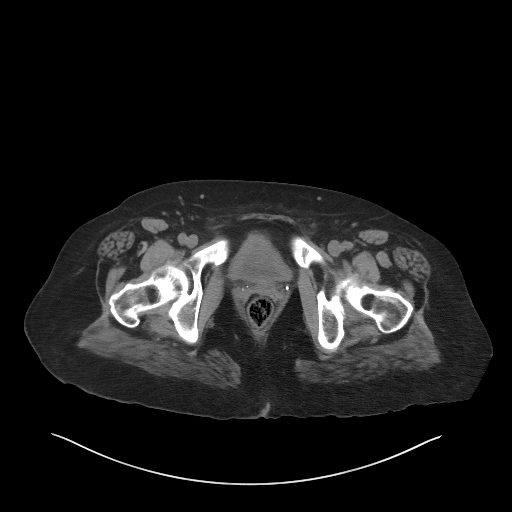
[im 21/84  soft-tissue]
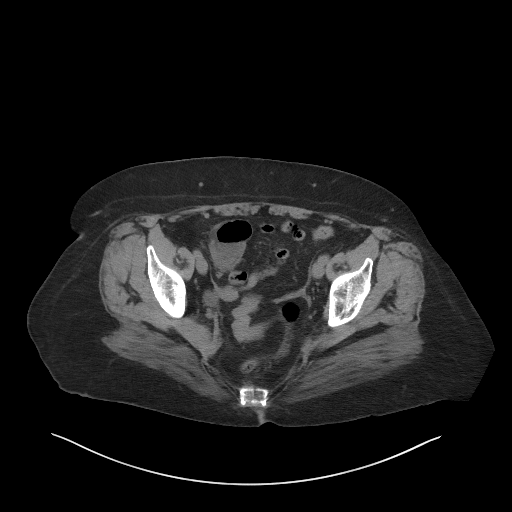
[im 26/84  soft-tissue]
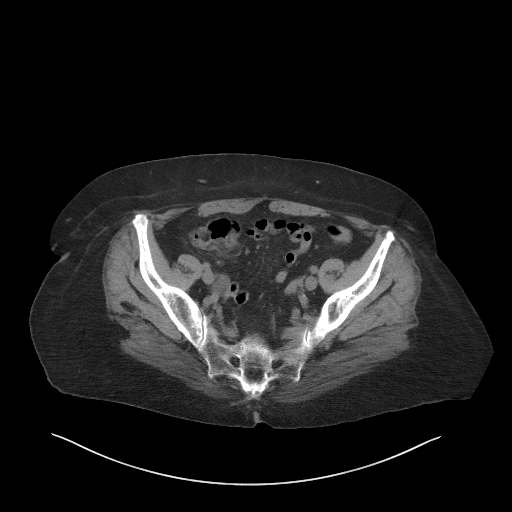
[im 32/84  soft-tissue]
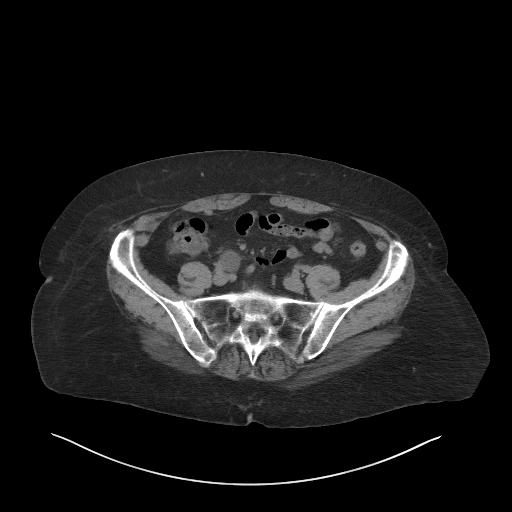
[im 37/84  soft-tissue]
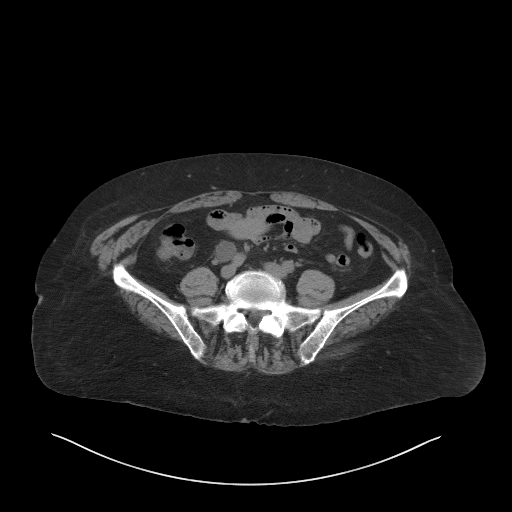
[im 47/84  soft-tissue]
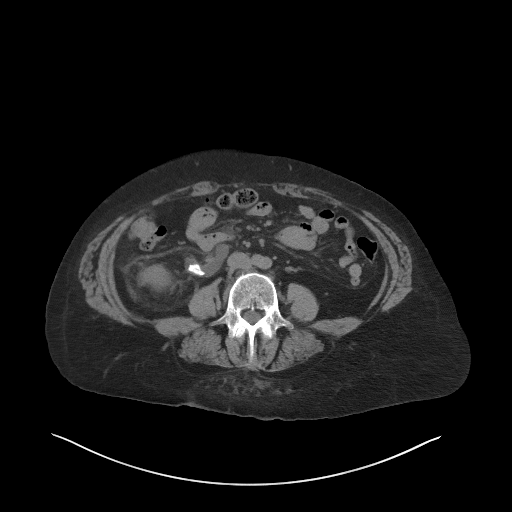
[im 52/84  soft-tissue]
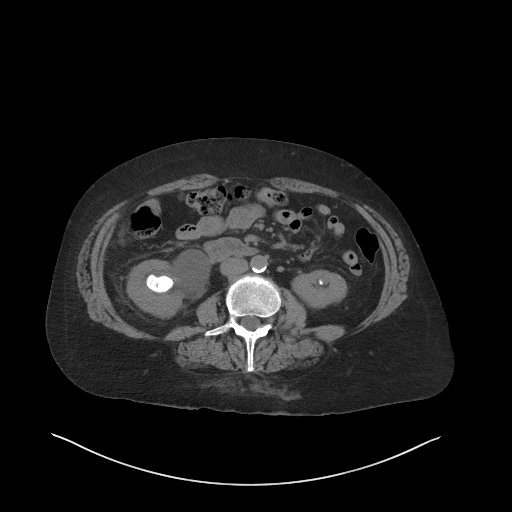
[im 58/84  soft-tissue]
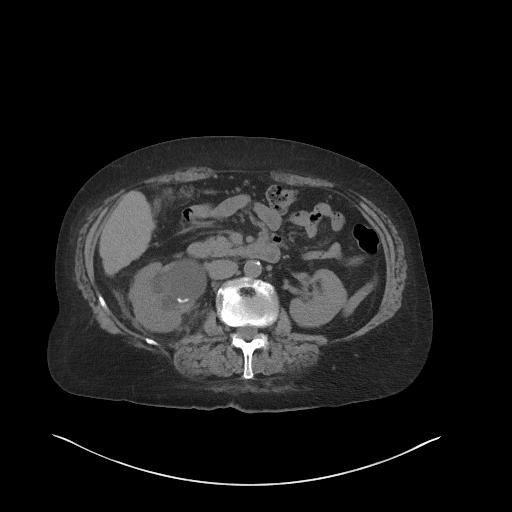
[im 58/84  bone]
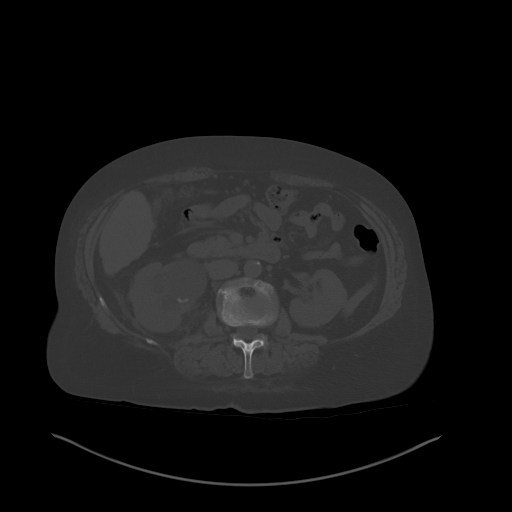
[im 63/84  soft-tissue]
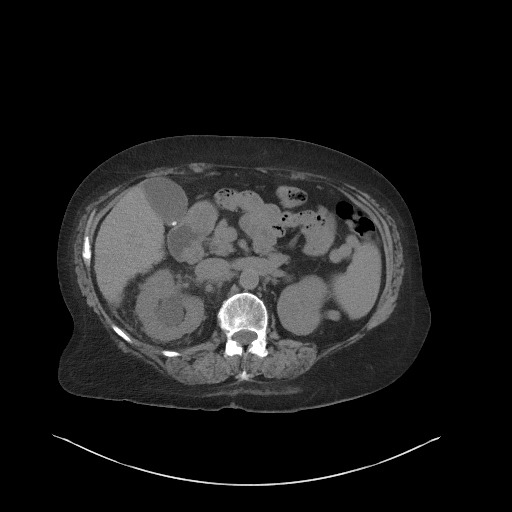
[im 73/84  soft-tissue]
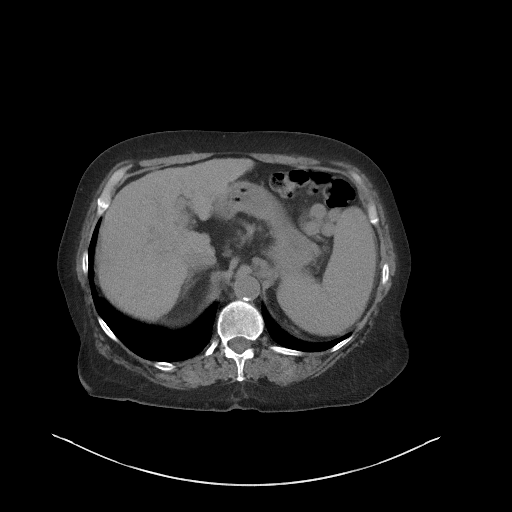
[im 78/84  soft-tissue]
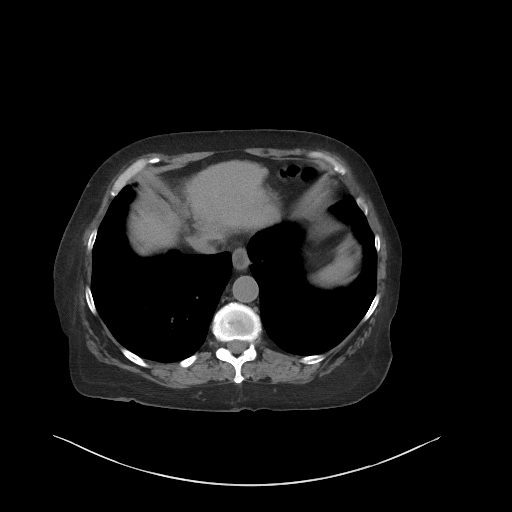

[Series 5: coronal st · coronal · 0.70mm/px · 3 of 82 slices shown]
[im 28/82  soft-tissue]
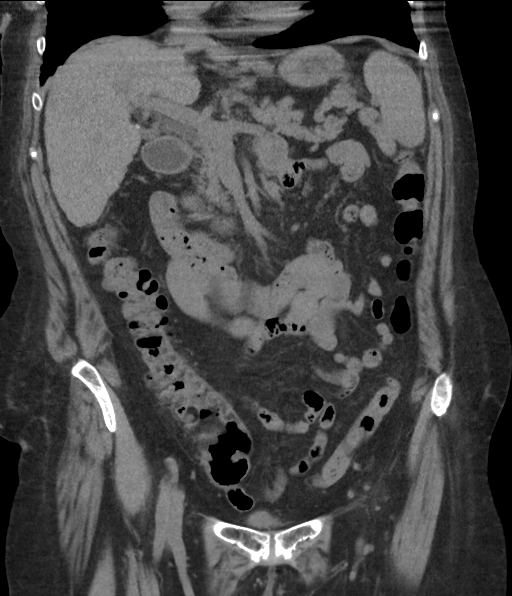
[im 37/82  soft-tissue]
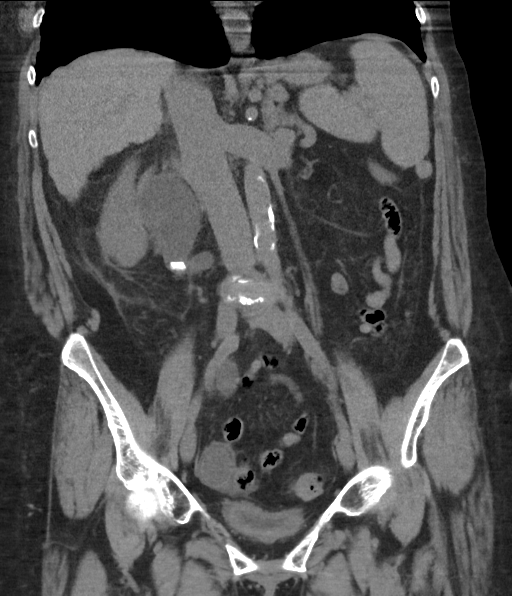
[im 46/82  soft-tissue]
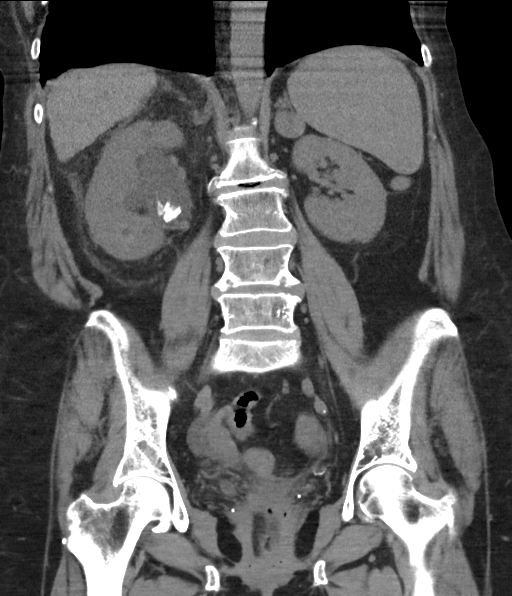

[15 of 46 positions shown; findings below may reference images not displayed]

FINDINGS: Lower chest: Negative. No pericardial or pleural effusion.

Hepatobiliary: Nodular, cirrhotic liver. No discrete liver lesion.

Calcified gallstones individually up to 12 millimeters. No
pericholecystic inflammation. Bile duct size appears stable.

Pancreas: Negative.

Spleen: Chronic splenomegaly and prominent perisplenic varices.
Gastrosplenic and splenorenal collaterals.

Adrenals/Urinary Tract: Left adrenal gland obscured by chronic
varices. Bulky left nephrolithiasis with individual calculi up to 8
millimeters but no left hydronephrosis. Decompressed left ureter.

Severe right hydronephrosis and perinephric stranding. Likely
forniceal rupture. Nephromegaly. Staghorn intrarenal calculi
individually up to 3 centimeters. Just distal to the ureteropelvic
junction there is a 17 millimeter staghorn calculus (series 2, image
38). But severe right hydroureter (20 millimeters diameter)
continues to the pelvis where there are clustered calculi in the
distal ureter which individually might measure up to 9 millimeters
(series 2, image 67), and a bulky 14 millimeter calculus lodged at
the ureterovesical junction (series 2, image 72).

Decompressed urinary bladder. Superimposed bilateral pelvic
phleboliths.

Stomach/Bowel: Decompressed rectosigmoid colon. Redundant sigmoid
with mild diverticulosis. No active inflammation. Negative
transverse and descending colon. Negative right colon and appendix.
Negative terminal ileum. No dilated small bowel.

Fluid-filled distal esophagus, but the stomach is largely
decompressed. No free air.

Vascular/Lymphatic: Vascular patency is not evaluated in the absence
of IV contrast. Aortoiliac calcified atherosclerosis.

Reproductive: Surgically absent uterus. Diminutive or absent
ovaries.

Other: Trace if any pelvic free fluid.

Musculoskeletal: Osteopenia. No acute osseous abnormality
identified.
IMPRESSION: 1. Severe Acute Obstructive Uropathy on the right with Forniceal
Rupture.
Bulky, staghorn-like calculi up to 17 mm are lodged at both the
right UPJ and UVJ.
Additional clustered stones in the dilated distal ureter
(steinstrasse).
Additional up to 3 centimeter staghorn calculi within the right
kidney.
2. Lesser left nephrolithiasis.
3. Chronic cirrhosis and stigmata of portal venous hypertension
including splenomegaly and perisplenic varices.
4. Cholelithiasis.

## 2018-10-15 MED ORDER — HYDROCODONE-ACETAMINOPHEN 5-325 MG PO TABS
1.0000 | ORAL_TABLET | Freq: Once | ORAL | Status: AC
  Administered 2018-10-15: 1 via ORAL
  Filled 2018-10-15: qty 1

## 2018-10-15 MED ORDER — CEPHALEXIN 500 MG PO CAPS
500.0000 mg | ORAL_CAPSULE | Freq: Once | ORAL | Status: AC
  Administered 2018-10-15: 500 mg via ORAL
  Filled 2018-10-15: qty 1

## 2018-10-15 NOTE — ED Provider Notes (Signed)
[facility] EMERGENCY DEPARTMENT Provider Note   CSN: [id number] Arrival date & time: 10/15/18  2012    History   Chief Complaint Chief Complaint  Patient presents with   Flank Pain    right    HPI Alyssa Pena is a 68 y.o. female.     HPI   She complains of urinary frequency with dysuria, for about 10 days, even after completing a treatment course of Bactrim for UTI called in by her PCP.  Pain is in her right lower abdomen and radiates to her right flank.  She denies fever, chills, vomiting, chest pain, weakness or dizziness.  She has had some nausea.  Her pain feels similar to her prior kidney stone.  There are no other known modifying factors.  Past Medical History:  Diagnosis Date   Alcohol dependence ([place]) 03/02/9449   Alcoholic hepatitis 3/88/8280   Cirrhosis ([place]) 08/10/2013   Fluid retention    Hypertension    Portal venous hypertension ([place]) 08/10/2013   Thrombocytopenia ([place]) 08/10/2013    Patient Active Problem List   Diagnosis Date Noted   Hepatitis C antibody test positive 03/49/1791   Diastolic CHF ([place]) 50/56/9794   Pulmonary hypertension ([place]) 08/12/2013   Hypokalemia 08/11/2013   IV drug user history 08/11/2013   Cirrhosis ([place]) 80/16/5537   Alcoholic hepatitis 48/27/0786   Portal venous hypertension ([place]) 08/10/2013   Thrombocytopenia ([place]) 08/10/2013   Alcohol dependence ([place]) 08/10/2013    Past Surgical History:  Procedure Laterality Date   ABDOMINAL HYSTERECTOMY     CATARACT EXTRACTION W/PHACO Left 08/30/2016   Procedure: CATARACT EXTRACTION PHACOEMULSIFICATION AND INTRAOCULAR LENS PLACEMENT LEFT EYE CDE - 46.93;  Surgeon: Baruch Goldmann, MD;  Location: [facility];  Service: Ophthalmology;  Laterality: Left;  left   CATARACT EXTRACTION W/PHACO Right 06/20/2017   Procedure: CATARACT EXTRACTION PHACO AND INTRAOCULAR LENS PLACEMENT RIGHT EYE;  Surgeon: Baruch Goldmann, MD;  Location: [facility];  Service: Ophthalmology;  Laterality:  Right;  right   KIDNEY STONE SURGERY       OB History   No obstetric history on file.      Home Medications    Prior to Admission medications   Medication Sig Start Date End Date Taking? Authorizing Provider  Biotin w/ Vitamins C & E (HAIR/SKIN/NAILS PO) Take 1 tablet by mouth daily.    [provider]  carvedilol (COREG) 3.125 MG tablet Take 1 tablet (3.125 mg total) by mouth 2 (two) times daily with a meal. Patient taking differently: Take 6.25 mg by mouth 2 (two) times daily with a meal.  08/12/13   Allie Bossier, MD  FLUoxetine (PROZAC) 20 MG capsule Take 20 mg by mouth daily.    [provider]  LORazepam (ATIVAN) 1 MG tablet Take 1 mg by mouth 2 (two) times daily.    [provider]    Family History Family History  Problem Relation Age of Onset   Lung cancer Father    Lung cancer Brother    COPD Brother    Dementia Mother    Arthritis Sister    High blood pressure Sister    COPD Sister     Social History Social History   Tobacco Use   Smoking status: Former Smoker   Smokeless tobacco: Never Used  Substance Use Topics   Alcohol use: Yes    Comment: daily   Drug use: No     Allergies   Penicillins   Review of Systems Review of Systems  All other systems reviewed and are negative.    Physical Exam Updated Vital Signs BP 136/81 (BP Location: Right Arm)    Pulse 70    Temp 98.2 F (36.8 C) (Oral)    Resp 18    Ht 5\' 5"  (1.651 m)    Wt 81.6 kg    SpO2 93%    BMI 29.95 kg/m   Physical Exam Vitals signs and nursing note reviewed.  Constitutional:      General: She is not in acute distress.    Appearance: She is well-developed. She is obese. She is not ill-appearing, toxic-appearing or diaphoretic.  HENT:     Head: Normocephalic and atraumatic.     Right Ear: External ear normal.     Left Ear: External ear normal.  Eyes:     Conjunctiva/sclera: Conjunctivae normal.     Pupils: Pupils are equal, round, and  reactive to light.  Neck:     Musculoskeletal: Normal range of motion and neck supple.     Trachea: Phonation normal.  Cardiovascular:     Rate and Rhythm: Normal rate.  Pulmonary:     Effort: Pulmonary effort is normal. No respiratory distress.  Abdominal:     Palpations: Abdomen is soft.     Tenderness: There is no abdominal tenderness. There is no right CVA tenderness or left CVA tenderness.  Musculoskeletal: Normal range of motion.  Skin:    General: Skin is warm and dry.  Neurological:     Mental Status: She is alert and oriented to person, place, and time.     Cranial Nerves: No cranial nerve deficit.     Sensory: No sensory deficit.     Motor: No abnormal muscle tone.     Coordination: Coordination normal.  Psychiatric:        Behavior: Behavior normal.        Thought Content: Thought content normal.        Judgment: Judgment normal.      ED Treatments / Results  Labs (all labs ordered are listed, but only abnormal results are displayed) Labs Reviewed  URINALYSIS, ROUTINE W REFLEX MICROSCOPIC - Abnormal; Notable for the following components:      Result Value   Color, Urine AMBER (*)    Glucose, UA   (*)    Value: TEST NOT REPORTED DUE TO COLOR INTERFERENCE OF URINE PIGMENT   Hgb urine dipstick   (*)    Value: TEST NOT REPORTED DUE TO COLOR INTERFERENCE OF URINE PIGMENT   Bilirubin Urine   (*)    Value: TEST NOT REPORTED DUE TO COLOR INTERFERENCE OF URINE PIGMENT   Ketones, ur   (*)    Value: TEST NOT REPORTED DUE TO COLOR INTERFERENCE OF URINE PIGMENT   Protein, ur   (*)    Value: TEST NOT REPORTED DUE TO COLOR INTERFERENCE OF URINE PIGMENT   Nitrite   (*)    Value: TEST NOT REPORTED DUE TO COLOR INTERFERENCE OF URINE PIGMENT   Leukocytes,Ua   (*)    Value: TEST NOT REPORTED DUE TO COLOR INTERFERENCE OF URINE PIGMENT   All other components within normal limits  URINALYSIS, MICROSCOPIC (REFLEX) - Abnormal; Notable for the following components:   Bacteria, UA  RARE (*)    All other components within normal limits  URINE CULTURE  BASIC METABOLIC PANEL  CBC WITH DIFFERENTIAL/PLATELET    EKG None  Radiology Ct Renal Stone Study  Result Date: 10/15/2018 CLINICAL DATA:  68 year old female with  right flank pain. EXAM: CT ABDOMEN AND PELVIS WITHOUT CONTRAST TECHNIQUE: Multidetector CT imaging of the abdomen and pelvis was performed following the standard protocol without IV contrast. COMPARISON:  CT Abdomen and Pelvis 08/10/2013. FINDINGS: Lower chest: Negative. No pericardial or pleural effusion. Hepatobiliary: Nodular, cirrhotic liver. No discrete liver lesion. Calcified gallstones individually up to 12 millimeters. No pericholecystic inflammation. Bile duct size appears stable. Pancreas: Negative. Spleen: Chronic splenomegaly and prominent perisplenic varices. Gastrosplenic and splenorenal collaterals. Adrenals/Urinary Tract: Left adrenal gland obscured by chronic varices. Bulky left nephrolithiasis with individual calculi up to 8 millimeters but no left hydronephrosis. Decompressed left ureter. Severe right hydronephrosis and perinephric stranding. Likely forniceal rupture. Nephromegaly. Staghorn intrarenal calculi individually up to 3 centimeters. Just distal to the ureteropelvic junction there is a 17 millimeter staghorn calculus (series 2, image 38). But severe right hydroureter (20 millimeters diameter) continues to the pelvis where there are clustered calculi in the distal ureter which individually might measure up to 9 millimeters (series 2, image 67), and a bulky 14 millimeter calculus lodged at the ureterovesical junction (series 2, image 72). Decompressed urinary bladder. Superimposed bilateral pelvic phleboliths. Stomach/Bowel: Decompressed rectosigmoid colon. Redundant sigmoid with mild diverticulosis. No active inflammation. Negative transverse and descending colon. Negative right colon and appendix. Negative terminal ileum. No dilated small bowel.  Fluid-filled distal esophagus, but the stomach is largely decompressed. No free air. Vascular/Lymphatic: Vascular patency is not evaluated in the absence of IV contrast. Aortoiliac calcified atherosclerosis. Reproductive: Surgically absent uterus. Diminutive or absent ovaries. Other: Trace if any pelvic free fluid. Musculoskeletal: Osteopenia. No acute osseous abnormality identified. IMPRESSION: 1. Severe Acute Obstructive Uropathy on the right with Forniceal Rupture. Bulky, staghorn-like calculi up to 17 mm are lodged at both the right UPJ and UVJ. Additional clustered stones in the dilated distal ureter (steinstrasse). Additional up to 3 centimeter staghorn calculi within the right kidney. 2. Lesser left nephrolithiasis. 3. Chronic cirrhosis and stigmata of portal venous hypertension including splenomegaly and perisplenic varices. 4. Cholelithiasis. Electronically Signed   By: Genevie Ann M.D.   On: 10/15/2018 23:00    Procedures Procedures (including critical care time)  Medications Ordered in ED Medications  HYDROcodone-acetaminophen (NORCO/VICODIN) 5-325 MG per tablet 1 tablet (1 tablet Oral Given 10/15/18 2242)  cephALEXin (KEFLEX) capsule 500 mg (500 mg Oral Given 10/15/18 2242)     Initial Impression / Assessment and Plan / ED Course  I have reviewed the triage vital signs and the nursing notes.  Pertinent labs & imaging results that were available during my care of the patient were reviewed by me and considered in my medical decision making (see chart for details).  Clinical Course as of Oct 16 10  Thu Oct 15, 2018  2308 Abnormal, elevated RBC, WBC, bacteria  Urinalysis, Microscopic (reflex)(!) [EW]  2308 Abnormal, unable to complete due to color change  Urinalysis, Routine w reflex microscopic(!) [EW]  2309 Right-sided hydronephrosis, hydroureter, staghorn calculi and possible forniceal rupture.  Images reviewed by me  CT Renal Laren Everts [EW]    Clinical Course User Index [EW]  Daleen Bo, MD        Patient Vitals for the past 24 hrs:  BP Temp Temp src Pulse Resp SpO2 Height Weight  10/15/18 2022 -- -- -- -- -- -- 5\' 5"  (1.651 m) 81.6 kg  10/15/18 2021 136/81 98.2 F (36.8 C) Oral 70 18 93 % -- --    2310 PM Reevaluation with update and discussion. After initial assessment and treatment, an updated  evaluation reveals no change in status, findings discussed with the patient all questions were answered. Daleen Bo   Medical Decision Making: Likely UTI, with staghorn calculi and significant hydronephrosis.  Patient is nontoxic.  Doubt pyelonephritis, serious bacterial infection or metabolic instability.  Screening labs ordered, requiring assessment prior to disposition.  CRITICAL CARE-no Performed by: Daleen Bo  Nursing Notes Reviewed/ Care Coordinated Applicable Imaging Reviewed Interpretation of Laboratory Data incorporated into ED treatment   Plan-disposition following return of labs, with treatment for UTI if discharged.  She will require urology follow-up.   Final Clinical Impressions(s) / ED Diagnoses   Final diagnoses:  Urinary tract infection without hematuria, site unspecified  Kidney stones  Hydronephrosis, right  Hydroureter, right    ED Discharge Orders    None       Daleen Bo, MD 10/16/18 [phone]

## 2018-10-15 NOTE — ED Triage Notes (Signed)
Patient complaining of right side flank pain. Patient states that pain became worse over yesterday and today. Patient was prescribed Bactrim by Dr. Sherrie Sport for a possible UTI. Patient states she took her last Bactrim today.

## 2018-10-16 ENCOUNTER — Other Ambulatory Visit: Payer: Self-pay

## 2018-10-16 ENCOUNTER — Encounter (HOSPITAL_COMMUNITY)
Admission: EM | Disposition: A | Discharge status: Home / Self Care | Payer: Self-pay | Source: Home / Self Care | Attending: Internal Medicine

## 2018-10-16 ENCOUNTER — Encounter (HOSPITAL_COMMUNITY): Payer: Self-pay

## 2018-10-16 DIAGNOSIS — N200 Calculus of kidney: Secondary | ICD-10-CM

## 2018-10-16 DIAGNOSIS — N132 Hydronephrosis with renal and ureteral calculous obstruction: Secondary | ICD-10-CM | POA: Diagnosis not present

## 2018-10-16 DIAGNOSIS — N1330 Unspecified hydronephrosis: Secondary | ICD-10-CM | POA: Diagnosis not present

## 2018-10-16 DIAGNOSIS — Z6829 Body mass index (BMI) 29.0-29.9, adult: Secondary | ICD-10-CM | POA: Diagnosis not present

## 2018-10-16 DIAGNOSIS — K7460 Unspecified cirrhosis of liver: Secondary | ICD-10-CM | POA: Diagnosis present

## 2018-10-16 DIAGNOSIS — N179 Acute kidney failure, unspecified: Secondary | ICD-10-CM | POA: Diagnosis present

## 2018-10-16 DIAGNOSIS — N201 Calculus of ureter: Secondary | ICD-10-CM | POA: Diagnosis not present

## 2018-10-16 DIAGNOSIS — F419 Anxiety disorder, unspecified: Secondary | ICD-10-CM | POA: Diagnosis present

## 2018-10-16 DIAGNOSIS — Z87442 Personal history of urinary calculi: Secondary | ICD-10-CM | POA: Diagnosis not present

## 2018-10-16 DIAGNOSIS — Z87440 Personal history of urinary (tract) infections: Secondary | ICD-10-CM | POA: Diagnosis not present

## 2018-10-16 DIAGNOSIS — Z87891 Personal history of nicotine dependence: Secondary | ICD-10-CM | POA: Diagnosis not present

## 2018-10-16 DIAGNOSIS — K7469 Other cirrhosis of liver: Secondary | ICD-10-CM | POA: Diagnosis not present

## 2018-10-16 DIAGNOSIS — I10 Essential (primary) hypertension: Secondary | ICD-10-CM | POA: Diagnosis present

## 2018-10-16 DIAGNOSIS — E669 Obesity, unspecified: Secondary | ICD-10-CM | POA: Diagnosis present

## 2018-10-16 DIAGNOSIS — R300 Dysuria: Secondary | ICD-10-CM | POA: Diagnosis not present

## 2018-10-16 DIAGNOSIS — N134 Hydroureter: Secondary | ICD-10-CM | POA: Diagnosis not present

## 2018-10-16 DIAGNOSIS — N390 Urinary tract infection, site not specified: Secondary | ICD-10-CM | POA: Diagnosis not present

## 2018-10-16 DIAGNOSIS — Z79899 Other long term (current) drug therapy: Secondary | ICD-10-CM | POA: Diagnosis not present

## 2018-10-16 DIAGNOSIS — Z880 Allergy status to penicillin: Secondary | ICD-10-CM | POA: Diagnosis not present

## 2018-10-16 DIAGNOSIS — F329 Major depressive disorder, single episode, unspecified: Secondary | ICD-10-CM | POA: Diagnosis present

## 2018-10-16 DIAGNOSIS — R188 Other ascites: Secondary | ICD-10-CM | POA: Diagnosis present

## 2018-10-16 DIAGNOSIS — E872 Acidosis: Secondary | ICD-10-CM | POA: Diagnosis present

## 2018-10-16 LAB — CBC
HCT: 35.2 % — ABNORMAL LOW (ref 36.0–46.0)
Hemoglobin: 11.6 g/dL — ABNORMAL LOW (ref 12.0–15.0)
MCH: 32.3 pg (ref 26.0–34.0)
MCHC: 33 g/dL (ref 30.0–36.0)
MCV: 98.1 fL (ref 80.0–100.0)
Platelets: 65 10*3/uL — ABNORMAL LOW (ref 150–400)
RBC: 3.59 MIL/uL — ABNORMAL LOW (ref 3.87–5.11)
RDW: 14.3 % (ref 11.5–15.5)
WBC: 8.2 10*3/uL (ref 4.0–10.5)
nRBC: 0 % (ref 0.0–0.2)

## 2018-10-16 LAB — COMPREHENSIVE METABOLIC PANEL
ALT: 16 U/L (ref 0–44)
AST: 25 U/L (ref 15–41)
Albumin: 2.5 g/dL — ABNORMAL LOW (ref 3.5–5.0)
Alkaline Phosphatase: 60 U/L (ref 38–126)
Anion gap: 5 (ref 5–15)
BUN: 18 mg/dL (ref 8–23)
CO2: 21 mmol/L — ABNORMAL LOW (ref 22–32)
Calcium: 8.6 mg/dL — ABNORMAL LOW (ref 8.9–10.3)
Chloride: 110 mmol/L (ref 98–111)
Creatinine, Ser: 1.58 mg/dL — ABNORMAL HIGH (ref 0.44–1.00)
GFR calc Af Amer: 39 mL/min — ABNORMAL LOW (ref >60)
GFR calc non Af Amer: 34 mL/min — ABNORMAL LOW (ref >60)
Glucose, Bld: 91 mg/dL (ref 70–99)
Potassium: 3.7 mmol/L (ref 3.5–5.1)
Sodium: 136 mmol/L (ref 135–145)
Total Bilirubin: 1.9 mg/dL — ABNORMAL HIGH (ref 0.3–1.2)
Total Protein: 6.1 g/dL — ABNORMAL LOW (ref 6.5–8.1)

## 2018-10-16 LAB — CBC WITH DIFFERENTIAL/PLATELET
Abs Immature Granulocytes: 0.02 10*3/uL (ref 0.00–0.07)
Basophils Absolute: 0 10*3/uL (ref 0.0–0.1)
Basophils Relative: 0 %
Eosinophils Absolute: 0.1 10*3/uL (ref 0.0–0.5)
Eosinophils Relative: 1 %
HCT: 35.9 % — ABNORMAL LOW (ref 36.0–46.0)
Hemoglobin: 12.5 g/dL (ref 12.0–15.0)
Immature Granulocytes: 0 %
Lymphocytes Relative: 19 %
Lymphs Abs: 1.8 10*3/uL (ref 0.7–4.0)
MCH: 33.1 pg (ref 26.0–34.0)
MCHC: 34.8 g/dL (ref 30.0–36.0)
MCV: 95 fL (ref 80.0–100.0)
Monocytes Absolute: 1.3 10*3/uL — ABNORMAL HIGH (ref 0.1–1.0)
Monocytes Relative: 13 %
Neutro Abs: 6.6 10*3/uL (ref 1.7–7.7)
Neutrophils Relative %: 67 %
Platelets: 74 10*3/uL — ABNORMAL LOW (ref 150–400)
RBC: 3.78 MIL/uL — ABNORMAL LOW (ref 3.87–5.11)
RDW: 14.1 % (ref 11.5–15.5)
WBC: 9.8 10*3/uL (ref 4.0–10.5)
nRBC: 0 % (ref 0.0–0.2)

## 2018-10-16 LAB — BASIC METABOLIC PANEL
Anion gap: 6 (ref 5–15)
BUN: 17 mg/dL (ref 8–23)
CO2: 18 mmol/L — ABNORMAL LOW (ref 22–32)
Calcium: 8.6 mg/dL — ABNORMAL LOW (ref 8.9–10.3)
Chloride: 109 mmol/L (ref 98–111)
Creatinine, Ser: 1.63 mg/dL — ABNORMAL HIGH (ref 0.44–1.00)
GFR calc Af Amer: 37 mL/min — ABNORMAL LOW (ref >60)
GFR calc non Af Amer: 32 mL/min — ABNORMAL LOW (ref >60)
Glucose, Bld: 94 mg/dL (ref 70–99)
Potassium: 3.8 mmol/L (ref 3.5–5.1)
Sodium: 133 mmol/L — ABNORMAL LOW (ref 135–145)

## 2018-10-16 SURGERY — CYSTOSCOPY, WITH RETROGRADE PYELOGRAM AND URETERAL STENT INSERTION
Anesthesia: Choice | Laterality: Right

## 2018-10-16 MED ORDER — LEVOFLOXACIN IN D5W 500 MG/100ML IV SOLN
500.0000 mg | INTRAVENOUS | Status: DC
  Administered 2018-10-16: 06:00:00 500 mg via INTRAVENOUS
  Filled 2018-10-16: qty 100

## 2018-10-16 MED ORDER — SUCCINYLCHOLINE CHLORIDE 200 MG/10ML IV SOSY
PREFILLED_SYRINGE | INTRAVENOUS | Status: AC
  Filled 2018-10-16: qty 10

## 2018-10-16 MED ORDER — CARVEDILOL 3.125 MG PO TABS
6.2500 mg | ORAL_TABLET | Freq: Two times a day (BID) | ORAL | Status: DC
  Administered 2018-10-16 – 2018-10-18 (×5): 6.25 mg via ORAL
  Filled 2018-10-16 (×5): qty 2

## 2018-10-16 MED ORDER — ACETAMINOPHEN 325 MG PO TABS
650.0000 mg | ORAL_TABLET | Freq: Three times a day (TID) | ORAL | Status: DC | PRN
  Administered 2018-10-16 – 2018-10-17 (×3): 650 mg via ORAL
  Filled 2018-10-16 (×2): qty 2

## 2018-10-16 MED ORDER — SODIUM CHLORIDE 0.9 % IV SOLN
INTRAVENOUS | Status: DC
  Administered 2018-10-16: 06:00:00 via INTRAVENOUS

## 2018-10-16 MED ORDER — LORAZEPAM 1 MG PO TABS
1.0000 mg | ORAL_TABLET | Freq: Two times a day (BID) | ORAL | Status: DC
  Administered 2018-10-16 – 2018-10-18 (×5): 1 mg via ORAL
  Filled 2018-10-16 (×5): qty 1

## 2018-10-16 MED ORDER — ONDANSETRON HCL 4 MG/2ML IJ SOLN
4.0000 mg | Freq: Four times a day (QID) | INTRAMUSCULAR | Status: DC | PRN
  Administered 2018-10-17: 11:00:00 4 mg via INTRAVENOUS
  Filled 2018-10-16: qty 2

## 2018-10-16 MED ORDER — LEVOFLOXACIN IN D5W 250 MG/50ML IV SOLN
250.0000 mg | INTRAVENOUS | Status: DC
  Administered 2018-10-17: 07:00:00 250 mg via INTRAVENOUS
  Filled 2018-10-16 (×4): qty 50

## 2018-10-16 MED ORDER — PROPOFOL 10 MG/ML IV BOLUS
INTRAVENOUS | Status: AC
  Filled 2018-10-16: qty 20

## 2018-10-16 MED ORDER — ONDANSETRON HCL 4 MG PO TABS: 4.0000 mg | ORAL_TABLET | Freq: Four times a day (QID) | ORAL | Status: DC | PRN

## 2018-10-16 MED ORDER — SODIUM CHLORIDE 0.9 % IV SOLN
INTRAVENOUS | Status: DC
  Administered 2018-10-16 – 2018-10-18 (×2): via INTRAVENOUS

## 2018-10-16 MED ORDER — FLUOXETINE HCL 20 MG PO CAPS
20.0000 mg | ORAL_CAPSULE | Freq: Every day | ORAL | Status: DC
  Administered 2018-10-16 – 2018-10-18 (×3): 20 mg via ORAL
  Filled 2018-10-16 (×3): qty 1

## 2018-10-16 NOTE — Consult Note (Signed)
Urology Consult   Physician requesting consult: Dr. Dyann Kief  Reason for consult: Right ureteral and renal calculi  History of Present Illness: Alyssa Pena is a 68 y.o. with a history of one prior stone episode in the distant past.  She states she required surgery but cannot remember what type of surgery she had.  She otherwise has not any other stone episodes or problems.  She developed symptoms of frequency and urgency and dysuria about a week ago and thought she might have a UTI.  She called her PCP and wanted to avoid a trip to the hospital or doctor's office due to COVID-19 and she received a prescription for antibiotics but did not have a UA or culture performed.  Last night, she developed severe right sided flank and abdominal pain with nausea.  She has denied fever.  She denies hematuria.    Her PMH is significant for cirrhosis due to past alcohol consumption (she no longer drinks), and has a history of hepatitis C, and hypertension.   Past Medical History:  Diagnosis Date  . Alcohol dependence ([place]) 08/10/2013  . Alcoholic hepatitis 0/92/3300  . Cirrhosis ([place]) 08/10/2013  . Fluid retention   . Hypertension   . Portal venous hypertension ([place]) 08/10/2013  . Thrombocytopenia ([place]) 08/10/2013    Past Surgical History:  Procedure Laterality Date  . ABDOMINAL HYSTERECTOMY    . CATARACT EXTRACTION W/PHACO Left 08/30/2016   Procedure: CATARACT EXTRACTION PHACOEMULSIFICATION AND INTRAOCULAR LENS PLACEMENT LEFT EYE CDE - 46.93;  Surgeon: Baruch Goldmann, MD;  Location: [facility];  Service: Ophthalmology;  Laterality: Left;  left  . CATARACT EXTRACTION W/PHACO Right 06/20/2017   Procedure: CATARACT EXTRACTION PHACO AND INTRAOCULAR LENS PLACEMENT RIGHT EYE;  Surgeon: Baruch Goldmann, MD;  Location: [facility];  Service: Ophthalmology;  Laterality: Right;  right  . KIDNEY STONE SURGERY       Current Hospital Medications:  Home meds:  No current facility-administered medications on file prior to  encounter.    Current Outpatient Medications on File Prior to Encounter  Medication Sig Dispense Refill  . Biotin w/ Vitamins C & E (HAIR/SKIN/NAILS PO) Take 1 tablet by mouth daily.    . carvedilol (COREG) 6.25 MG tablet Take 6.25 mg by mouth 2 (two) times daily.    Marland Kitchen FLUoxetine (PROZAC) 20 MG capsule Take 20 mg by mouth daily.    Marland Kitchen LORazepam (ATIVAN) 1 MG tablet Take 1 mg by mouth 2 (two) times daily.    Marland Kitchen sulfamethoxazole-trimethoprim (BACTRIM DS) 800-160 MG tablet Take 1 tablet by mouth 2 (two) times daily.       Scheduled Meds: . carvedilol  6.25 mg Oral BID WC  . FLUoxetine  20 mg Oral Daily  . LORazepam  1 mg Oral BID   Continuous Infusions: . sodium chloride 10 mL/hr at 10/16/18 0557  . sodium chloride 75 mL/hr at 10/16/18 1605  . [START ON 10/17/2018] levofloxacin (LEVAQUIN) IV     PRN Meds:.acetaminophen, ondansetron **OR** ondansetron (ZOFRAN) IV  Allergies:  Allergies  Allergen Reactions  . Penicillins Hives and Other (See Comments)    Has patient had a PCN reaction causing immediate rash, facial/tongue/throat swelling, SOB or lightheadedness with hypotension: No Has patient had a PCN reaction causing severe rash involving mucus membranes or skin necrosis: No Has patient had a PCN reaction that required hospitalization No Has patient had a PCN reaction occurring within the last 10 years: No If all of the above answers are "NO", then may proceed with Cephalosporin  use.     Family History  Problem Relation Age of Onset  . Lung cancer Father   . Lung cancer Brother   . COPD Brother   . Dementia Mother   . Arthritis Sister   . High blood pressure Sister   . COPD Sister     Social History:  reports that she has quit smoking. She has never used smokeless tobacco. She reports current alcohol use. She reports that she does not use drugs.  ROS: A complete review of systems was performed.  All systems are negative except for pertinent findings as noted.  Physical  Exam:  Vital signs in last 24 hours: Temp:  [97.9 F (36.6 C)-98.5 F (36.9 C)] 98.5 F (36.9 C) (05/01 1605) Pulse Rate:  [56-70] 56 (05/01 1605) Resp:  [16-18] 16 (05/01 1605) BP: (126-151)/(53-81) 126/53 (05/01 1605) SpO2:  [92 %-94 %] 94 % (05/01 1605) Weight:  [81.6 kg] 81.6 kg (04/30 2022) Constitutional:  Alert and oriented, No acute distress Cardiovascular: Regular rate and rhythm, No JVD Respiratory: Normal respiratory effort, Lungs clear bilaterally GI: Abdomen is markedly tender over right side. GU: Severe right CVA tenderness Lymphatic: No lymphadenopathy Neurologic: Grossly intact, no focal deficits Psychiatric: Normal mood and affect  Laboratory Data:  Recent Labs    10/15/18 2355 10/16/18 0555  WBC 9.8 8.2  HGB 12.5 11.6*  HCT 35.9* 35.2*  PLT 74* 65*    Recent Labs    10/15/18 2355 10/16/18 0555  NA 133* 136  K 3.8 3.7  CL 109 110  GLUCOSE 94 91  BUN 17 18  CALCIUM 8.6* 8.6*  CREATININE 1.63* 1.58*     Results for orders placed or performed during the hospital encounter of 10/15/18 (from the past 24 hour(s))  Urinalysis, Routine w reflex microscopic     Status: Abnormal   Collection Time: 10/15/18  8:29 PM  Result Value Ref Range   Color, Urine AMBER (A) YELLOW   APPearance CLEAR CLEAR   Specific Gravity, Urine  1.005 - 1.030    TEST NOT REPORTED DUE TO COLOR INTERFERENCE OF URINE PIGMENT   pH  5.0 - 8.0    TEST NOT REPORTED DUE TO COLOR INTERFERENCE OF URINE PIGMENT   Glucose, UA (A) NEGATIVE mg/dL    TEST NOT REPORTED DUE TO COLOR INTERFERENCE OF URINE PIGMENT   Hgb urine dipstick (A) NEGATIVE    TEST NOT REPORTED DUE TO COLOR INTERFERENCE OF URINE PIGMENT   Bilirubin Urine (A) NEGATIVE    TEST NOT REPORTED DUE TO COLOR INTERFERENCE OF URINE PIGMENT   Ketones, ur (A) NEGATIVE mg/dL    TEST NOT REPORTED DUE TO COLOR INTERFERENCE OF URINE PIGMENT   Protein, ur (A) NEGATIVE mg/dL    TEST NOT REPORTED DUE TO COLOR INTERFERENCE OF URINE  PIGMENT   Nitrite (A) NEGATIVE    TEST NOT REPORTED DUE TO COLOR INTERFERENCE OF URINE PIGMENT   Leukocytes,Ua (A) NEGATIVE    TEST NOT REPORTED DUE TO COLOR INTERFERENCE OF URINE PIGMENT  Urinalysis, Microscopic (reflex)     Status: Abnormal   Collection Time: 10/15/18  8:29 PM  Result Value Ref Range   RBC / HPF 21-50 0 - 5 RBC/hpf   WBC, UA 11-20 0 - 5 WBC/hpf   Bacteria, UA RARE (A) NONE SEEN   Squamous Epithelial / LPF 6-10 0 - 5  Basic metabolic panel     Status: Abnormal   Collection Time: 10/15/18 11:55 PM  Result Value Ref Range  Sodium 133 (L) 135 - 145 mmol/L   Potassium 3.8 3.5 - 5.1 mmol/L   Chloride 109 98 - 111 mmol/L   CO2 18 (L) 22 - 32 mmol/L   Glucose, Bld 94 70 - 99 mg/dL   BUN 17 8 - 23 mg/dL   Creatinine, Ser 1.63 (H) 0.44 - 1.00 mg/dL   Calcium 8.6 (L) 8.9 - 10.3 mg/dL   GFR calc non Af Amer 32 (L) >60 mL/min   GFR calc Af Amer 37 (L) >60 mL/min   Anion gap 6 5 - 15  CBC with Differential     Status: Abnormal   Collection Time: 10/15/18 11:55 PM  Result Value Ref Range   WBC 9.8 4.0 - 10.5 K/uL   RBC 3.78 (L) 3.87 - 5.11 MIL/uL   Hemoglobin 12.5 12.0 - 15.0 g/dL   HCT 35.9 (L) 36.0 - 46.0 %   MCV 95.0 80.0 - 100.0 fL   MCH 33.1 26.0 - 34.0 pg   MCHC 34.8 30.0 - 36.0 g/dL   RDW 14.1 11.5 - 15.5 %   Platelets 74 (L) 150 - 400 K/uL   nRBC 0.0 0.0 - 0.2 %   Neutrophils Relative % 67 %   Neutro Abs 6.6 1.7 - 7.7 K/uL   Lymphocytes Relative 19 %   Lymphs Abs 1.8 0.7 - 4.0 K/uL   Monocytes Relative 13 %   Monocytes Absolute 1.3 (H) 0.1 - 1.0 K/uL   Eosinophils Relative 1 %   Eosinophils Absolute 0.1 0.0 - 0.5 K/uL   Basophils Relative 0 %   Basophils Absolute 0.0 0.0 - 0.1 K/uL   Immature Granulocytes 0 %   Abs Immature Granulocytes 0.02 0.00 - 0.07 K/uL  CBC     Status: Abnormal   Collection Time: 10/16/18  5:55 AM  Result Value Ref Range   WBC 8.2 4.0 - 10.5 K/uL   RBC 3.59 (L) 3.87 - 5.11 MIL/uL   Hemoglobin 11.6 (L) 12.0 - 15.0 g/dL   HCT  35.2 (L) 36.0 - 46.0 %   MCV 98.1 80.0 - 100.0 fL   MCH 32.3 26.0 - 34.0 pg   MCHC 33.0 30.0 - 36.0 g/dL   RDW 14.3 11.5 - 15.5 %   Platelets 65 (L) 150 - 400 K/uL   nRBC 0.0 0.0 - 0.2 %  Comprehensive metabolic panel     Status: Abnormal   Collection Time: 10/16/18  5:55 AM  Result Value Ref Range   Sodium 136 135 - 145 mmol/L   Potassium 3.7 3.5 - 5.1 mmol/L   Chloride 110 98 - 111 mmol/L   CO2 21 (L) 22 - 32 mmol/L   Glucose, Bld 91 70 - 99 mg/dL   BUN 18 8 - 23 mg/dL   Creatinine, Ser 1.58 (H) 0.44 - 1.00 mg/dL   Calcium 8.6 (L) 8.9 - 10.3 mg/dL   Total Protein 6.1 (L) 6.5 - 8.1 g/dL   Albumin 2.5 (L) 3.5 - 5.0 g/dL   AST 25 15 - 41 U/L   ALT 16 0 - 44 U/L   Alkaline Phosphatase 60 38 - 126 U/L   Total Bilirubin 1.9 (H) 0.3 - 1.2 mg/dL   GFR calc non Af Amer 34 (L) >60 mL/min   GFR calc Af Amer 39 (L) >60 mL/min   Anion gap 5 5 - 15   No results found for this or any previous visit (from the past 240 hour(s)).  Renal Function: Recent Labs  10/15/18 2355 10/16/18 0555  CREATININE 1.63* 1.58*   Estimated Creatinine Clearance: 36.4 mL/min (A) (by C-G formula based on SCr of 1.58 mg/dL (H)).  Radiologic Imaging: Ct Renal Stone Study  Result Date: 10/15/2018 CLINICAL DATA:  68 year old female with right flank pain. EXAM: CT ABDOMEN AND PELVIS WITHOUT CONTRAST TECHNIQUE: Multidetector CT imaging of the abdomen and pelvis was performed following the standard protocol without IV contrast. COMPARISON:  CT Abdomen and Pelvis 08/10/2013. FINDINGS: Lower chest: Negative. No pericardial or pleural effusion. Hepatobiliary: Nodular, cirrhotic liver. No discrete liver lesion. Calcified gallstones individually up to 12 millimeters. No pericholecystic inflammation. Bile duct size appears stable. Pancreas: Negative. Spleen: Chronic splenomegaly and prominent perisplenic varices. Gastrosplenic and splenorenal collaterals. Adrenals/Urinary Tract: Left adrenal gland obscured by chronic  varices. Bulky left nephrolithiasis with individual calculi up to 8 millimeters but no left hydronephrosis. Decompressed left ureter. Severe right hydronephrosis and perinephric stranding. Likely forniceal rupture. Nephromegaly. Staghorn intrarenal calculi individually up to 3 centimeters. Just distal to the ureteropelvic junction there is a 17 millimeter staghorn calculus (series 2, image 38). But severe right hydroureter (20 millimeters diameter) continues to the pelvis where there are clustered calculi in the distal ureter which individually might measure up to 9 millimeters (series 2, image 67), and a bulky 14 millimeter calculus lodged at the ureterovesical junction (series 2, image 72). Decompressed urinary bladder. Superimposed bilateral pelvic phleboliths. Stomach/Bowel: Decompressed rectosigmoid colon. Redundant sigmoid with mild diverticulosis. No active inflammation. Negative transverse and descending colon. Negative right colon and appendix. Negative terminal ileum. No dilated small bowel. Fluid-filled distal esophagus, but the stomach is largely decompressed. No free air. Vascular/Lymphatic: Vascular patency is not evaluated in the absence of IV contrast. Aortoiliac calcified atherosclerosis. Reproductive: Surgically absent uterus. Diminutive or absent ovaries. Other: Trace if any pelvic free fluid. Musculoskeletal: Osteopenia. No acute osseous abnormality identified. IMPRESSION: 1. Severe Acute Obstructive Uropathy on the right with Forniceal Rupture. Bulky, staghorn-like calculi up to 17 mm are lodged at both the right UPJ and UVJ. Additional clustered stones in the dilated distal ureter (steinstrasse). Additional up to 3 centimeter staghorn calculi within the right kidney. 2. Lesser left nephrolithiasis. 3. Chronic cirrhosis and stigmata of portal venous hypertension including splenomegaly and perisplenic varices. 4. Cholelithiasis. Electronically Signed   By: Genevie Ann M.D.   On: 10/15/2018 23:00     I independently reviewed the above imaging studies.  Impression/Recommendation: 1) Right renal and ureteral calculi:  She has very large burden right renal and ureteral stone burden that will not pass.  I recommended right ureteral stent placement to improve her pain as a temporizing measure.  I discussed the potential benefits and risks of the procedure, side effects of the proposed treatment, the likelihood of the patient achieving the goals of the procedure, and any potential problems that might occur during the procedure or recuperation.  Unfortunately, she at lunch and so her procedure will need to be delayed (had planned to do this afternoon).  Her pain is currently controlled and so she will be scheduled for stent placement tomorrow by Dr. Louis Meckel.  We discussed definitive management of her stone can be done at a later date (she is concerned about COVID -33) and will likely involve multiple procedures including percutaneous and ureteroscopic intervention.  Her ability to undergo a percutaneous procedure will need to be assessed after further assessment of her bleeding risk considering her history of liver disease and thrombocytopenia.  Following stent placement, she can be discharged with pain medication and  she should follow up with me as an outpatient anytime over the next month.  I am not convinced she has a UTI.  Considering she never had a UA before starting antibiotics, it is difficult to know but her symptoms are consistent with the large stone in her intravesical ureter.  Dutch Gray 10/16/2018, 5:25 PM  Pryor Curia. MD   CC: Dr. Dyann Kief

## 2018-10-16 NOTE — Anesthesia Preprocedure Evaluation (Addendum)
Anesthesia Evaluation  Patient identified by MRN, date of birth, ID band Patient awake    Reviewed: Allergy & Precautions, NPO status , Patient's Chart, lab work & pertinent test results, reviewed documented beta blocker date and time   Airway Mallampati: II  TM Distance: >3 FB Neck ROM: Full    Dental no notable dental hx. (+) Missing   Pulmonary neg pulmonary ROS, former smoker,    Pulmonary exam normal breath sounds clear to auscultation       Cardiovascular Exercise Tolerance: Poor hypertension, Pt. on medications and Pt. on home beta blockers +CHF  Normal cardiovascular examII Rhythm:Regular Rate:Normal  Limited mostly by knee pain  Denies CP or SOB H/o CHF -denies recent failure   Neuro/Psych PSYCHIATRIC DISORDERS negative neurological ROS     GI/Hepatic negative GI ROS, (+) Hepatitis -, Toxin Related  Endo/Other  negative endocrine ROS  Renal/GU Renal InsufficiencyRenal diseaseHere for Stone Treatment with Stent   negative genitourinary   Musculoskeletal negative musculoskeletal ROS (+)   Abdominal   Peds negative pediatric ROS (+)  Hematology negative hematology ROS (+)   Anesthesia Other Findings   Reproductive/Obstetrics negative OB ROS                            Anesthesia Physical Anesthesia Plan  ASA: III  Anesthesia Plan: General   Post-op Pain Management:    Induction: Intravenous  PONV Risk Score and Plan:   Airway Management Planned: Oral ETT  Additional Equipment:   Intra-op Plan:   Post-operative Plan: Extubation in OR  Informed Consent: I have reviewed the patients History and Physical, chart, labs and discussed the procedure including the risks, benefits and alternatives for the proposed anesthesia with the patient or authorized representative who has indicated his/her understanding and acceptance.     Dental advisory given  Plan Discussed with:  Surgeon  Anesthesia Plan Comments: (Full PPE use planned  GETA d/w pt -WTP with same after Q&A)        Anesthesia Quick Evaluation

## 2018-10-16 NOTE — Progress Notes (Signed)
Patient seen and examined.  Admitted after midnight secondary to worsening right flank pain, urinary frequency and dysuria.  Patient reported use of Bactrim for UTI symptoms provided by her PCP approximately 5-7 days prior to admission.  She is afebrile, no nausea, no vomiting.  Blood work demonstrated worsening renal function and CT scan has showed a staghorn calculi that is extended into the ureter with right hydronephrosis.  Please refer to H&P written by Dr. Darrick Meigs for further info/details on admission.  Plan -Patient encouraged oral hydration -Will add some gentle IV fluids -Continue IV antibiotics -Follow urology service recommendations   Barton Dubois MD [phone]

## 2018-10-16 NOTE — H&P (Signed)
TRH H&P    Patient Demographics:    Alyssa Pena, is a 68 y.o. female  MRN: [id number]  DOB - [birth year]  Admit Date - 10/15/2018  Referring MD/NP/PA:   Outpatient Primary MD for the patient is Neale Burly, MD  Patient coming from: Home  Chief complaint-flank pain   HPI:    Alyssa Pena  is a 68 y.o. female, with history of hepatitis C, liver cirrhosis, thrombocytopenia who came to ED with complaint of urinary frequency, dysuria for about 10 days.  Patient was prescribed Bactrim by her PCP last week but patient says that symptoms did not improve. She denies fever.  No chills.  No vomiting.  No chest pain or shortness of breath. In the ED CT stone protocol showed right staghorn calculus in the right kidney, ureteral stones, right hydronephrosis.  Also patient had mild elevation of creatinine 1.6, with her baseline creatinine 0.79 as of March 2018. Patient placed under observation for urology evaluation. She denies history of stroke or seizures. No history of MI.    Review of systems:    In addition to the HPI above,   All other systems reviewed and are negative.    Past History of the following :    Past Medical History:  Diagnosis Date   Alcohol dependence ([place]) 3/61/4431   Alcoholic hepatitis 5/40/0867   Cirrhosis ([place]) 08/10/2013   Fluid retention    Hypertension    Portal venous hypertension ([place]) 08/10/2013   Thrombocytopenia ([place]) 08/10/2013      Past Surgical History:  Procedure Laterality Date   ABDOMINAL HYSTERECTOMY     CATARACT EXTRACTION W/PHACO Left 08/30/2016   Procedure: CATARACT EXTRACTION PHACOEMULSIFICATION AND INTRAOCULAR LENS PLACEMENT LEFT EYE CDE - 46.93;  Surgeon: Baruch Goldmann, MD;  Location: [facility];  Service: Ophthalmology;  Laterality: Left;  left   CATARACT EXTRACTION W/PHACO Right 06/20/2017   Procedure: CATARACT EXTRACTION PHACO AND INTRAOCULAR LENS  PLACEMENT RIGHT EYE;  Surgeon: Baruch Goldmann, MD;  Location: [facility];  Service: Ophthalmology;  Laterality: Right;  right   KIDNEY STONE SURGERY        Social History:      Social History   Tobacco Use   Smoking status: Former Smoker   Smokeless tobacco: Never Used  Substance Use Topics   Alcohol use: Yes    Comment: daily       Family History :     Family History  Problem Relation Age of Onset   Lung cancer Father    Lung cancer Brother    COPD Brother    Dementia Mother    Arthritis Sister    High blood pressure Sister    COPD Sister       Home Medications:   Prior to Admission medications   Medication Sig Start Date End Date Taking? Authorizing Provider  Biotin w/ Vitamins C & E (HAIR/SKIN/NAILS PO) Take 1 tablet by mouth daily.    [provider]  carvedilol (COREG) 3.125 MG tablet Take 1 tablet (3.125 mg total) by mouth 2 (two)  times daily with a meal. Patient taking differently: Take 6.25 mg by mouth 2 (two) times daily with a meal.  08/12/13   Allie Bossier, MD  FLUoxetine (PROZAC) 20 MG capsule Take 20 mg by mouth daily.    [provider]  LORazepam (ATIVAN) 1 MG tablet Take 1 mg by mouth 2 (two) times daily.    [provider]     Allergies:     Allergies  Allergen Reactions   Penicillins Hives and Other (See Comments)    Has patient had a PCN reaction causing immediate rash, facial/tongue/throat swelling, SOB or lightheadedness with hypotension: No Has patient had a PCN reaction causing severe rash involving mucus membranes or skin necrosis: No Has patient had a PCN reaction that required hospitalization No Has patient had a PCN reaction occurring within the last 10 years: No If all of the above answers are "NO", then may proceed with Cephalosporin use.      Physical Exam:   Vitals  Blood pressure 136/81, pulse 70, temperature 98.2 F (36.8 C), temperature source Oral, resp. rate 18, height 5\' 5"   (1.651 m), weight 81.6 kg, SpO2 93 %.  1.  General: Appears in no acute distress  2. Psychiatric: Alert, oriented x3, intact insight and judgment  3. Neurologic: Cranial nerves II 12 grossly intact, no focal deficit noted  4. HEENMT:  Atraumatic normocephalic, extraocular muscles intact.  Oral mucosa is moist.  5. Respiratory : Clear to auscultation bilaterally, no wheezing or crackles  6. Cardiovascular : S1-S2, regular, no murmur auscultated  7. Gastrointestinal:  Abdominal soft, nontender, no organomegaly     Data Review:    CBC Recent Labs  Lab 10/15/18 2355  WBC 9.8  HGB 12.5  HCT 35.9*  PLT 74*  MCV 95.0  MCH 33.1  MCHC 34.8  RDW 14.1  LYMPHSABS 1.8  MONOABS 1.3*  EOSABS 0.1  BASOSABS 0.0   ------------------------------------------------------------------------------------------------------------------  Results for orders placed or performed during the hospital encounter of 10/15/18 (from the past 48 hour(s))  Urinalysis, Routine w reflex microscopic     Status: Abnormal   Collection Time: 10/15/18  8:29 PM  Result Value Ref Range   Color, Urine AMBER (A) YELLOW    Comment: BIOCHEMICALS MAY BE AFFECTED BY COLOR   APPearance CLEAR CLEAR   Specific Gravity, Urine  1.005 - 1.030    TEST NOT REPORTED DUE TO COLOR INTERFERENCE OF URINE PIGMENT   pH  5.0 - 8.0    TEST NOT REPORTED DUE TO COLOR INTERFERENCE OF URINE PIGMENT   Glucose, UA (A) NEGATIVE mg/dL    TEST NOT REPORTED DUE TO COLOR INTERFERENCE OF URINE PIGMENT   Hgb urine dipstick (A) NEGATIVE    TEST NOT REPORTED DUE TO COLOR INTERFERENCE OF URINE PIGMENT   Bilirubin Urine (A) NEGATIVE    TEST NOT REPORTED DUE TO COLOR INTERFERENCE OF URINE PIGMENT    Comment: TEST NOT REPORTED DUE TO COLOR INTERFERENCE OF URINE PIGMENT   Ketones, ur (A) NEGATIVE mg/dL    TEST NOT REPORTED DUE TO COLOR INTERFERENCE OF URINE PIGMENT   Protein, ur (A) NEGATIVE mg/dL    TEST NOT REPORTED DUE TO COLOR  INTERFERENCE OF URINE PIGMENT   Nitrite (A) NEGATIVE    TEST NOT REPORTED DUE TO COLOR INTERFERENCE OF URINE PIGMENT   Leukocytes,Ua (A) NEGATIVE    TEST NOT REPORTED DUE TO COLOR INTERFERENCE OF URINE PIGMENT    Comment: Performed at [facility], [address]  [place], [place] [address]  Urinalysis, Microscopic (reflex)     Status: Abnormal   Collection Time: 10/15/18  8:29 PM  Result Value Ref Range   RBC / HPF 21-50 0 - 5 RBC/hpf   WBC, UA 11-20 0 - 5 WBC/hpf   Bacteria, UA RARE (A) NONE SEEN   Squamous Epithelial / LPF 6-10 0 - 5    Comment: Performed at [facility], [address] [place], [place] [address]  Basic metabolic panel     Status: Abnormal   Collection Time: 10/15/18 11:55 PM  Result Value Ref Range   Sodium 133 (L) 135 - 145 mmol/L   Potassium 3.8 3.5 - 5.1 mmol/L   Chloride 109 98 - 111 mmol/L   CO2 18 (L) 22 - 32 mmol/L   Glucose, Bld 94 70 - 99 mg/dL   BUN 17 8 - 23 mg/dL   Creatinine, Ser 1.63 (H) 0.44 - 1.00 mg/dL   Calcium 8.6 (L) 8.9 - 10.3 mg/dL   GFR calc non Af Amer 32 (L) >60 mL/min   GFR calc Af Amer 37 (L) >60 mL/min   Anion gap 6 5 - 15    Comment: Performed at [facility], [address], [place], [place] [address]  CBC with Differential     Status: Abnormal   Collection Time: 10/15/18 11:55 PM  Result Value Ref Range   WBC 9.8 4.0 - 10.5 K/uL   RBC 3.78 (L) 3.87 - 5.11 MIL/uL   Hemoglobin 12.5 12.0 - 15.0 g/dL   HCT 35.9 (L) 36.0 - 46.0 %   MCV 95.0 80.0 - 100.0 fL   MCH 33.1 26.0 - 34.0 pg   MCHC 34.8 30.0 - 36.0 g/dL   RDW 14.1 11.5 - 15.5 %   Platelets 74 (L) 150 - 400 K/uL    Comment: PLATELET COUNT CONFIRMED BY SMEAR SPECIMEN CHECKED FOR CLOTS Immature Platelet Fraction may be clinically indicated, consider ordering this additional test [id number]    nRBC 0.0 0.0 - 0.2 %   Neutrophils Relative % 67 %   Neutro Abs 6.6 1.7 - 7.7 K/uL   Lymphocytes Relative 19 %   Lymphs Abs 1.8 0.7 - 4.0 K/uL   Monocytes Relative 13 %    Monocytes Absolute 1.3 (H) 0.1 - 1.0 K/uL   Eosinophils Relative 1 %   Eosinophils Absolute 0.1 0.0 - 0.5 K/uL   Basophils Relative 0 %   Basophils Absolute 0.0 0.0 - 0.1 K/uL   Immature Granulocytes 0 %   Abs Immature Granulocytes 0.02 0.00 - 0.07 K/uL    Comment: Performed at [facility], [address], [place], [place] [address]    Chemistries  Recent Labs  Lab 10/15/18 2355  NA 133*  K 3.8  CL 109  CO2 18*  GLUCOSE 94  BUN 17  CREATININE 1.63*  CALCIUM 8.6*   ------------------------------------------------------------------------------------------------------------------  ------------------------------------------------------------------------------------------------------------------  --------------------------------------------------------------------------------------------------------------- Urine analysis:    Component Value Date/Time   COLORURINE AMBER (A) 10/15/2018 2029   APPEARANCEUR CLEAR 10/15/2018 2029   LABSPEC  10/15/2018 2029    TEST NOT REPORTED DUE TO COLOR INTERFERENCE OF URINE PIGMENT   PHURINE  10/15/2018 2029    TEST NOT REPORTED DUE TO COLOR INTERFERENCE OF URINE PIGMENT   GLUCOSEU (A) 10/15/2018 2029    TEST NOT REPORTED DUE TO COLOR INTERFERENCE OF URINE PIGMENT   HGBUR (A) 10/15/2018 2029    TEST NOT REPORTED DUE TO COLOR INTERFERENCE OF URINE PIGMENT   BILIRUBINUR (A) 10/15/2018 2029  TEST NOT REPORTED DUE TO COLOR INTERFERENCE OF URINE PIGMENT   KETONESUR (A) 10/15/2018 2029    TEST NOT REPORTED DUE TO COLOR INTERFERENCE OF URINE PIGMENT   PROTEINUR (A) 10/15/2018 2029    TEST NOT REPORTED DUE TO COLOR INTERFERENCE OF URINE PIGMENT   NITRITE (A) 10/15/2018 2029    TEST NOT REPORTED DUE TO COLOR INTERFERENCE OF URINE PIGMENT   LEUKOCYTESUR (A) 10/15/2018 2029    TEST NOT REPORTED DUE TO COLOR INTERFERENCE OF URINE PIGMENT      Imaging Results:    Ct Renal Stone Study  Result Date: 10/15/2018 CLINICAL DATA:  68 year old female  with right flank pain. EXAM: CT ABDOMEN AND PELVIS WITHOUT CONTRAST TECHNIQUE: Multidetector CT imaging of the abdomen and pelvis was performed following the standard protocol without IV contrast. COMPARISON:  CT Abdomen and Pelvis 08/10/2013. FINDINGS: Lower chest: Negative. No pericardial or pleural effusion. Hepatobiliary: Nodular, cirrhotic liver. No discrete liver lesion. Calcified gallstones individually up to 12 millimeters. No pericholecystic inflammation. Bile duct size appears stable. Pancreas: Negative. Spleen: Chronic splenomegaly and prominent perisplenic varices. Gastrosplenic and splenorenal collaterals. Adrenals/Urinary Tract: Left adrenal gland obscured by chronic varices. Bulky left nephrolithiasis with individual calculi up to 8 millimeters but no left hydronephrosis. Decompressed left ureter. Severe right hydronephrosis and perinephric stranding. Likely forniceal rupture. Nephromegaly. Staghorn intrarenal calculi individually up to 3 centimeters. Just distal to the ureteropelvic junction there is a 17 millimeter staghorn calculus (series 2, image 38). But severe right hydroureter (20 millimeters diameter) continues to the pelvis where there are clustered calculi in the distal ureter which individually might measure up to 9 millimeters (series 2, image 67), and a bulky 14 millimeter calculus lodged at the ureterovesical junction (series 2, image 72). Decompressed urinary bladder. Superimposed bilateral pelvic phleboliths. Stomach/Bowel: Decompressed rectosigmoid colon. Redundant sigmoid with mild diverticulosis. No active inflammation. Negative transverse and descending colon. Negative right colon and appendix. Negative terminal ileum. No dilated small bowel. Fluid-filled distal esophagus, but the stomach is largely decompressed. No free air. Vascular/Lymphatic: Vascular patency is not evaluated in the absence of IV contrast. Aortoiliac calcified atherosclerosis. Reproductive: Surgically absent  uterus. Diminutive or absent ovaries. Other: Trace if any pelvic free fluid. Musculoskeletal: Osteopenia. No acute osseous abnormality identified. IMPRESSION: 1. Severe Acute Obstructive Uropathy on the right with Forniceal Rupture. Bulky, staghorn-like calculi up to 17 mm are lodged at both the right UPJ and UVJ. Additional clustered stones in the dilated distal ureter (steinstrasse). Additional up to 3 centimeter staghorn calculi within the right kidney. 2. Lesser left nephrolithiasis. 3. Chronic cirrhosis and stigmata of portal venous hypertension including splenomegaly and perisplenic varices. 4. Cholelithiasis. Electronically Signed   By: Genevie Ann M.D.   On: 10/15/2018 23:00    My personal review of EKG: Rhythm NSR   Assessment & Plan:    Active Problems:   AKI (acute kidney injury) ([place])   1. Acute obstructive uropathy/right hydronephrosis/ureteral stones/staghorn calculi-place under observation, will obtain urology evaluation.  Patient has abnormal UA.  Will start Levaquin per pharmacy consultation.  Urine culture has been obtained.  2. Acute kidney injury-patient's baseline creatinine 0.79 as of 2018.  Today creatinine is 1.6.  Would avoid giving IV fluids as likely obstructive uropathy as above.  Urology consultation has been obtained as above.  3. History of liver cirrhosis-stable, no decompensation.  We will continue to monitor.    DVT Prophylaxis-    SCDs   AM Labs Ordered, also please review Full Orders  Family Communication: Admission,  patients condition and plan of care including tests being ordered have been discussed with the patient  who indicate understanding and agree with the plan and Code Status.  Code Status: Full code  Admission status: Observation: Based on patients clinical presentation and evaluation of above clinical data, I have made determination that patient meets Inpatient criteria at this time.  Time spent in minutes : 60 minutes   Oswald Hillock M.D on  10/16/2018 at 4:42 AM

## 2018-10-16 NOTE — ED Provider Notes (Signed)
Care assumed from Dr. Eulis Foster, patient with UTI and staghorn calculi and right hydronephrosis pending labs.  WBC is normal with normal differential.  However, she has noted to have acute kidney injury with creatinine of 1.63 (most recent creatinine on record was from 2018 and was 0.79, patient states she was never told of any abnormal kidney tests).  Also, noted to have metabolic acidosis with normal anion gap.  Given acute kidney injury, bilateral staghorn calculi, UTI and right-sided hydronephrosis, I feel patient needs to be admitted to help with a urologic consultation.  Case is discussed with Dr. Darrick Meigs of [facility] hospitalist who agrees to admit the patient.  Results for orders placed or performed during the hospital encounter of 10/15/18  Urinalysis, Routine w reflex microscopic  Result Value Ref Range   Color, Urine AMBER (A) YELLOW   APPearance CLEAR CLEAR   Specific Gravity, Urine  1.005 - 1.030    TEST NOT REPORTED DUE TO COLOR INTERFERENCE OF URINE PIGMENT   pH  5.0 - 8.0    TEST NOT REPORTED DUE TO COLOR INTERFERENCE OF URINE PIGMENT   Glucose, UA (A) NEGATIVE mg/dL    TEST NOT REPORTED DUE TO COLOR INTERFERENCE OF URINE PIGMENT   Hgb urine dipstick (A) NEGATIVE    TEST NOT REPORTED DUE TO COLOR INTERFERENCE OF URINE PIGMENT   Bilirubin Urine (A) NEGATIVE    TEST NOT REPORTED DUE TO COLOR INTERFERENCE OF URINE PIGMENT   Ketones, ur (A) NEGATIVE mg/dL    TEST NOT REPORTED DUE TO COLOR INTERFERENCE OF URINE PIGMENT   Protein, ur (A) NEGATIVE mg/dL    TEST NOT REPORTED DUE TO COLOR INTERFERENCE OF URINE PIGMENT   Nitrite (A) NEGATIVE    TEST NOT REPORTED DUE TO COLOR INTERFERENCE OF URINE PIGMENT   Leukocytes,Ua (A) NEGATIVE    TEST NOT REPORTED DUE TO COLOR INTERFERENCE OF URINE PIGMENT  Urinalysis, Microscopic (reflex)  Result Value Ref Range   RBC / HPF 21-50 0 - 5 RBC/hpf   WBC, UA 11-20 0 - 5 WBC/hpf   Bacteria, UA RARE (A) NONE SEEN   Squamous Epithelial / LPF 6-10 0 - 5   Basic metabolic panel  Result Value Ref Range   Sodium 133 (L) 135 - 145 mmol/L   Potassium 3.8 3.5 - 5.1 mmol/L   Chloride 109 98 - 111 mmol/L   CO2 18 (L) 22 - 32 mmol/L   Glucose, Bld 94 70 - 99 mg/dL   BUN 17 8 - 23 mg/dL   Creatinine, Ser 1.63 (H) 0.44 - 1.00 mg/dL   Calcium 8.6 (L) 8.9 - 10.3 mg/dL   GFR calc non Af Amer 32 (L) >60 mL/min   GFR calc Af Amer 37 (L) >60 mL/min   Anion gap 6 5 - 15  CBC with Differential  Result Value Ref Range   WBC 9.8 4.0 - 10.5 K/uL   RBC 3.78 (L) 3.87 - 5.11 MIL/uL   Hemoglobin 12.5 12.0 - 15.0 g/dL   HCT 35.9 (L) 36.0 - 46.0 %   MCV 95.0 80.0 - 100.0 fL   MCH 33.1 26.0 - 34.0 pg   MCHC 34.8 30.0 - 36.0 g/dL   RDW 14.1 11.5 - 15.5 %   Platelets 74 (L) 150 - 400 K/uL   nRBC 0.0 0.0 - 0.2 %   Neutrophils Relative % 67 %   Neutro Abs 6.6 1.7 - 7.7 K/uL   Lymphocytes Relative 19 %   Lymphs Abs 1.8 0.7 -  4.0 K/uL   Monocytes Relative 13 %   Monocytes Absolute 1.3 (H) 0.1 - 1.0 K/uL   Eosinophils Relative 1 %   Eosinophils Absolute 0.1 0.0 - 0.5 K/uL   Basophils Relative 0 %   Basophils Absolute 0.0 0.0 - 0.1 K/uL   Immature Granulocytes 0 %   Abs Immature Granulocytes 0.02 0.00 - 0.07 K/uL   Ct Renal Stone Study  Result Date: 10/15/2018 CLINICAL DATA:  68 year old female with right flank pain. EXAM: CT ABDOMEN AND PELVIS WITHOUT CONTRAST TECHNIQUE: Multidetector CT imaging of the abdomen and pelvis was performed following the standard protocol without IV contrast. COMPARISON:  CT Abdomen and Pelvis 08/10/2013. FINDINGS: Lower chest: Negative. No pericardial or pleural effusion. Hepatobiliary: Nodular, cirrhotic liver. No discrete liver lesion. Calcified gallstones individually up to 12 millimeters. No pericholecystic inflammation. Bile duct size appears stable. Pancreas: Negative. Spleen: Chronic splenomegaly and prominent perisplenic varices. Gastrosplenic and splenorenal collaterals. Adrenals/Urinary Tract: Left adrenal gland obscured  by chronic varices. Bulky left nephrolithiasis with individual calculi up to 8 millimeters but no left hydronephrosis. Decompressed left ureter. Severe right hydronephrosis and perinephric stranding. Likely forniceal rupture. Nephromegaly. Staghorn intrarenal calculi individually up to 3 centimeters. Just distal to the ureteropelvic junction there is a 17 millimeter staghorn calculus (series 2, image 38). But severe right hydroureter (20 millimeters diameter) continues to the pelvis where there are clustered calculi in the distal ureter which individually might measure up to 9 millimeters (series 2, image 67), and a bulky 14 millimeter calculus lodged at the ureterovesical junction (series 2, image 72). Decompressed urinary bladder. Superimposed bilateral pelvic phleboliths. Stomach/Bowel: Decompressed rectosigmoid colon. Redundant sigmoid with mild diverticulosis. No active inflammation. Negative transverse and descending colon. Negative right colon and appendix. Negative terminal ileum. No dilated small bowel. Fluid-filled distal esophagus, but the stomach is largely decompressed. No free air. Vascular/Lymphatic: Vascular patency is not evaluated in the absence of IV contrast. Aortoiliac calcified atherosclerosis. Reproductive: Surgically absent uterus. Diminutive or absent ovaries. Other: Trace if any pelvic free fluid. Musculoskeletal: Osteopenia. No acute osseous abnormality identified. IMPRESSION: 1. Severe Acute Obstructive Uropathy on the right with Forniceal Rupture. Bulky, staghorn-like calculi up to 17 mm are lodged at both the right UPJ and UVJ. Additional clustered stones in the dilated distal ureter (steinstrasse). Additional up to 3 centimeter staghorn calculi within the right kidney. 2. Lesser left nephrolithiasis. 3. Chronic cirrhosis and stigmata of portal venous hypertension including splenomegaly and perisplenic varices. 4. Cholelithiasis. Electronically Signed   By: Genevie Ann M.D.   On: 54/00/8676  19:50      Delora Fuel, MD 93/26/71 [phone]

## 2018-10-16 NOTE — Progress Notes (Addendum)
Pharmacy Antibiotic Note  Alyssa Pena is a 68 y.o. female admitted on 10/15/2018 with flank pain and complaint of urinary frequency, dysuria for about 10 days.  Patient was prescribed Bactrim by her PCP last week but patient says that symptoms did not improve. Pharmacy has been consulted for levaquin dosing.  Plan: Levaquin 500mg  IV q24hr.  Height: 5\' 5"  (165.1 cm) Weight: 180 lb (81.6 kg) IBW/kg (Calculated) : 57  Temp (24hrs), Avg:98 F (36.7 C), Min:97.9 F (36.6 C), Max:98.2 F (36.8 C)  Recent Labs  Lab 10/15/18 2355  WBC 9.8  CREATININE 1.63*    Estimated Creatinine Clearance: 35.3 mL/min (A) (by C-G formula based on SCr of 1.63 mg/dL (H)).    Allergies  Allergen Reactions  . Penicillins Hives and Other (See Comments)    Has patient had a PCN reaction causing immediate rash, facial/tongue/throat swelling, SOB or lightheadedness with hypotension: No Has patient had a PCN reaction causing severe rash involving mucus membranes or skin necrosis: No Has patient had a PCN reaction that required hospitalization No Has patient had a PCN reaction occurring within the last 10 years: No If all of the above answers are "NO", then may proceed with Cephalosporin use.     Antimicrobials this admission: none  Dose adjustments this admission: Will dose for crcl ~ 12ml/min. Patient had mild elevation of creatinine 1.6, with her baseline creatinine 0.79 as of March 2018.   Thank you for allowing pharmacy to be a part of this patient's care.  Wyline Mood 10/16/2018 5:45 AM

## 2018-10-16 NOTE — Care Management Obs Status (Signed)
[facility] NOTIFICATION   Patient Details  Name: Alyssa Pena MRN: [id number] Date of Birth: 05/25/1951   Medicare Observation Status Notification Given:  Yes    Tommy Medal 10/16/2018, 1:43 PM

## 2018-10-16 NOTE — H&P (View-Only) (Signed)
Urology Consult   Physician requesting consult: Dr. Dyann Kief  Reason for consult: Right ureteral and renal calculi  History of Present Illness: Alyssa Pena is a 68 y.o. with a history of one prior stone episode in the distant past.  She states she required surgery but cannot remember what type of surgery she had.  She otherwise has not any other stone episodes or problems.  She developed symptoms of frequency and urgency and dysuria about a week ago and thought she might have a UTI.  She called her PCP and wanted to avoid a trip to the hospital or doctor's office due to COVID-19 and she received a prescription for antibiotics but did not have a UA or culture performed.  Last night, she developed severe right sided flank and abdominal pain with nausea.  She has denied fever.  She denies hematuria.    Her PMH is significant for cirrhosis due to past alcohol consumption (she no longer drinks), and has a history of hepatitis C, and hypertension.   Past Medical History:  Diagnosis Date  . Alcohol dependence ([place]) 08/10/2013  . Alcoholic hepatitis 09/16/270  . Cirrhosis ([place]) 08/10/2013  . Fluid retention   . Hypertension   . Portal venous hypertension ([place]) 08/10/2013  . Thrombocytopenia ([place]) 08/10/2013    Past Surgical History:  Procedure Laterality Date  . ABDOMINAL HYSTERECTOMY    . CATARACT EXTRACTION W/PHACO Left 08/30/2016   Procedure: CATARACT EXTRACTION PHACOEMULSIFICATION AND INTRAOCULAR LENS PLACEMENT LEFT EYE CDE - 46.93;  Surgeon: Baruch Goldmann, MD;  Location: [facility];  Service: Ophthalmology;  Laterality: Left;  left  . CATARACT EXTRACTION W/PHACO Right 06/20/2017   Procedure: CATARACT EXTRACTION PHACO AND INTRAOCULAR LENS PLACEMENT RIGHT EYE;  Surgeon: Baruch Goldmann, MD;  Location: [facility];  Service: Ophthalmology;  Laterality: Right;  right  . KIDNEY STONE SURGERY       Current Hospital Medications:  Home meds:  No current facility-administered medications on file prior to  encounter.    Current Outpatient Medications on File Prior to Encounter  Medication Sig Dispense Refill  . Biotin w/ Vitamins C & E (HAIR/SKIN/NAILS PO) Take 1 tablet by mouth daily.    . carvedilol (COREG) 6.25 MG tablet Take 6.25 mg by mouth 2 (two) times daily.    Marland Kitchen FLUoxetine (PROZAC) 20 MG capsule Take 20 mg by mouth daily.    Marland Kitchen LORazepam (ATIVAN) 1 MG tablet Take 1 mg by mouth 2 (two) times daily.    Marland Kitchen sulfamethoxazole-trimethoprim (BACTRIM DS) 800-160 MG tablet Take 1 tablet by mouth 2 (two) times daily.       Scheduled Meds: . carvedilol  6.25 mg Oral BID WC  . FLUoxetine  20 mg Oral Daily  . LORazepam  1 mg Oral BID   Continuous Infusions: . sodium chloride 10 mL/hr at 10/16/18 0557  . sodium chloride 75 mL/hr at 10/16/18 1605  . [START ON 10/17/2018] levofloxacin (LEVAQUIN) IV     PRN Meds:.acetaminophen, ondansetron **OR** ondansetron (ZOFRAN) IV  Allergies:  Allergies  Allergen Reactions  . Penicillins Hives and Other (See Comments)    Has patient had a PCN reaction causing immediate rash, facial/tongue/throat swelling, SOB or lightheadedness with hypotension: No Has patient had a PCN reaction causing severe rash involving mucus membranes or skin necrosis: No Has patient had a PCN reaction that required hospitalization No Has patient had a PCN reaction occurring within the last 10 years: No If all of the above answers are "NO", then may proceed with Cephalosporin  use.     Family History  Problem Relation Age of Onset  . Lung cancer Father   . Lung cancer Brother   . COPD Brother   . Dementia Mother   . Arthritis Sister   . High blood pressure Sister   . COPD Sister     Social History:  reports that she has quit smoking. She has never used smokeless tobacco. She reports current alcohol use. She reports that she does not use drugs.  ROS: A complete review of systems was performed.  All systems are negative except for pertinent findings as noted.  Physical  Exam:  Vital signs in last 24 hours: Temp:  [97.9 F (36.6 C)-98.5 F (36.9 C)] 98.5 F (36.9 C) (05/01 1605) Pulse Rate:  [56-70] 56 (05/01 1605) Resp:  [16-18] 16 (05/01 1605) BP: (126-151)/(53-81) 126/53 (05/01 1605) SpO2:  [92 %-94 %] 94 % (05/01 1605) Weight:  [81.6 kg] 81.6 kg (04/30 2022) Constitutional:  Alert and oriented, No acute distress Cardiovascular: Regular rate and rhythm, No JVD Respiratory: Normal respiratory effort, Lungs clear bilaterally GI: Abdomen is markedly tender over right side. GU: Severe right CVA tenderness Lymphatic: No lymphadenopathy Neurologic: Grossly intact, no focal deficits Psychiatric: Normal mood and affect  Laboratory Data:  Recent Labs    10/15/18 2355 10/16/18 0555  WBC 9.8 8.2  HGB 12.5 11.6*  HCT 35.9* 35.2*  PLT 74* 65*    Recent Labs    10/15/18 2355 10/16/18 0555  NA 133* 136  K 3.8 3.7  CL 109 110  GLUCOSE 94 91  BUN 17 18  CALCIUM 8.6* 8.6*  CREATININE 1.63* 1.58*     Results for orders placed or performed during the hospital encounter of 10/15/18 (from the past 24 hour(s))  Urinalysis, Routine w reflex microscopic     Status: Abnormal   Collection Time: 10/15/18  8:29 PM  Result Value Ref Range   Color, Urine AMBER (A) YELLOW   APPearance CLEAR CLEAR   Specific Gravity, Urine  1.005 - 1.030    TEST NOT REPORTED DUE TO COLOR INTERFERENCE OF URINE PIGMENT   pH  5.0 - 8.0    TEST NOT REPORTED DUE TO COLOR INTERFERENCE OF URINE PIGMENT   Glucose, UA (A) NEGATIVE mg/dL    TEST NOT REPORTED DUE TO COLOR INTERFERENCE OF URINE PIGMENT   Hgb urine dipstick (A) NEGATIVE    TEST NOT REPORTED DUE TO COLOR INTERFERENCE OF URINE PIGMENT   Bilirubin Urine (A) NEGATIVE    TEST NOT REPORTED DUE TO COLOR INTERFERENCE OF URINE PIGMENT   Ketones, ur (A) NEGATIVE mg/dL    TEST NOT REPORTED DUE TO COLOR INTERFERENCE OF URINE PIGMENT   Protein, ur (A) NEGATIVE mg/dL    TEST NOT REPORTED DUE TO COLOR INTERFERENCE OF URINE  PIGMENT   Nitrite (A) NEGATIVE    TEST NOT REPORTED DUE TO COLOR INTERFERENCE OF URINE PIGMENT   Leukocytes,Ua (A) NEGATIVE    TEST NOT REPORTED DUE TO COLOR INTERFERENCE OF URINE PIGMENT  Urinalysis, Microscopic (reflex)     Status: Abnormal   Collection Time: 10/15/18  8:29 PM  Result Value Ref Range   RBC / HPF 21-50 0 - 5 RBC/hpf   WBC, UA 11-20 0 - 5 WBC/hpf   Bacteria, UA RARE (A) NONE SEEN   Squamous Epithelial / LPF 6-10 0 - 5  Basic metabolic panel     Status: Abnormal   Collection Time: 10/15/18 11:55 PM  Result Value Ref Range  Sodium 133 (L) 135 - 145 mmol/L   Potassium 3.8 3.5 - 5.1 mmol/L   Chloride 109 98 - 111 mmol/L   CO2 18 (L) 22 - 32 mmol/L   Glucose, Bld 94 70 - 99 mg/dL   BUN 17 8 - 23 mg/dL   Creatinine, Ser 1.63 (H) 0.44 - 1.00 mg/dL   Calcium 8.6 (L) 8.9 - 10.3 mg/dL   GFR calc non Af Amer 32 (L) >60 mL/min   GFR calc Af Amer 37 (L) >60 mL/min   Anion gap 6 5 - 15  CBC with Differential     Status: Abnormal   Collection Time: 10/15/18 11:55 PM  Result Value Ref Range   WBC 9.8 4.0 - 10.5 K/uL   RBC 3.78 (L) 3.87 - 5.11 MIL/uL   Hemoglobin 12.5 12.0 - 15.0 g/dL   HCT 35.9 (L) 36.0 - 46.0 %   MCV 95.0 80.0 - 100.0 fL   MCH 33.1 26.0 - 34.0 pg   MCHC 34.8 30.0 - 36.0 g/dL   RDW 14.1 11.5 - 15.5 %   Platelets 74 (L) 150 - 400 K/uL   nRBC 0.0 0.0 - 0.2 %   Neutrophils Relative % 67 %   Neutro Abs 6.6 1.7 - 7.7 K/uL   Lymphocytes Relative 19 %   Lymphs Abs 1.8 0.7 - 4.0 K/uL   Monocytes Relative 13 %   Monocytes Absolute 1.3 (H) 0.1 - 1.0 K/uL   Eosinophils Relative 1 %   Eosinophils Absolute 0.1 0.0 - 0.5 K/uL   Basophils Relative 0 %   Basophils Absolute 0.0 0.0 - 0.1 K/uL   Immature Granulocytes 0 %   Abs Immature Granulocytes 0.02 0.00 - 0.07 K/uL  CBC     Status: Abnormal   Collection Time: 10/16/18  5:55 AM  Result Value Ref Range   WBC 8.2 4.0 - 10.5 K/uL   RBC 3.59 (L) 3.87 - 5.11 MIL/uL   Hemoglobin 11.6 (L) 12.0 - 15.0 g/dL   HCT  35.2 (L) 36.0 - 46.0 %   MCV 98.1 80.0 - 100.0 fL   MCH 32.3 26.0 - 34.0 pg   MCHC 33.0 30.0 - 36.0 g/dL   RDW 14.3 11.5 - 15.5 %   Platelets 65 (L) 150 - 400 K/uL   nRBC 0.0 0.0 - 0.2 %  Comprehensive metabolic panel     Status: Abnormal   Collection Time: 10/16/18  5:55 AM  Result Value Ref Range   Sodium 136 135 - 145 mmol/L   Potassium 3.7 3.5 - 5.1 mmol/L   Chloride 110 98 - 111 mmol/L   CO2 21 (L) 22 - 32 mmol/L   Glucose, Bld 91 70 - 99 mg/dL   BUN 18 8 - 23 mg/dL   Creatinine, Ser 1.58 (H) 0.44 - 1.00 mg/dL   Calcium 8.6 (L) 8.9 - 10.3 mg/dL   Total Protein 6.1 (L) 6.5 - 8.1 g/dL   Albumin 2.5 (L) 3.5 - 5.0 g/dL   AST 25 15 - 41 U/L   ALT 16 0 - 44 U/L   Alkaline Phosphatase 60 38 - 126 U/L   Total Bilirubin 1.9 (H) 0.3 - 1.2 mg/dL   GFR calc non Af Amer 34 (L) >60 mL/min   GFR calc Af Amer 39 (L) >60 mL/min   Anion gap 5 5 - 15   No results found for this or any previous visit (from the past 240 hour(s)).  Renal Function: Recent Labs  10/15/18 2355 10/16/18 0555  CREATININE 1.63* 1.58*   Estimated Creatinine Clearance: 36.4 mL/min (A) (by C-G formula based on SCr of 1.58 mg/dL (H)).  Radiologic Imaging: Ct Renal Stone Study  Result Date: 10/15/2018 CLINICAL DATA:  68 year old female with right flank pain. EXAM: CT ABDOMEN AND PELVIS WITHOUT CONTRAST TECHNIQUE: Multidetector CT imaging of the abdomen and pelvis was performed following the standard protocol without IV contrast. COMPARISON:  CT Abdomen and Pelvis 08/10/2013. FINDINGS: Lower chest: Negative. No pericardial or pleural effusion. Hepatobiliary: Nodular, cirrhotic liver. No discrete liver lesion. Calcified gallstones individually up to 12 millimeters. No pericholecystic inflammation. Bile duct size appears stable. Pancreas: Negative. Spleen: Chronic splenomegaly and prominent perisplenic varices. Gastrosplenic and splenorenal collaterals. Adrenals/Urinary Tract: Left adrenal gland obscured by chronic  varices. Bulky left nephrolithiasis with individual calculi up to 8 millimeters but no left hydronephrosis. Decompressed left ureter. Severe right hydronephrosis and perinephric stranding. Likely forniceal rupture. Nephromegaly. Staghorn intrarenal calculi individually up to 3 centimeters. Just distal to the ureteropelvic junction there is a 17 millimeter staghorn calculus (series 2, image 38). But severe right hydroureter (20 millimeters diameter) continues to the pelvis where there are clustered calculi in the distal ureter which individually might measure up to 9 millimeters (series 2, image 67), and a bulky 14 millimeter calculus lodged at the ureterovesical junction (series 2, image 72). Decompressed urinary bladder. Superimposed bilateral pelvic phleboliths. Stomach/Bowel: Decompressed rectosigmoid colon. Redundant sigmoid with mild diverticulosis. No active inflammation. Negative transverse and descending colon. Negative right colon and appendix. Negative terminal ileum. No dilated small bowel. Fluid-filled distal esophagus, but the stomach is largely decompressed. No free air. Vascular/Lymphatic: Vascular patency is not evaluated in the absence of IV contrast. Aortoiliac calcified atherosclerosis. Reproductive: Surgically absent uterus. Diminutive or absent ovaries. Other: Trace if any pelvic free fluid. Musculoskeletal: Osteopenia. No acute osseous abnormality identified. IMPRESSION: 1. Severe Acute Obstructive Uropathy on the right with Forniceal Rupture. Bulky, staghorn-like calculi up to 17 mm are lodged at both the right UPJ and UVJ. Additional clustered stones in the dilated distal ureter (steinstrasse). Additional up to 3 centimeter staghorn calculi within the right kidney. 2. Lesser left nephrolithiasis. 3. Chronic cirrhosis and stigmata of portal venous hypertension including splenomegaly and perisplenic varices. 4. Cholelithiasis. Electronically Signed   By: Genevie Ann M.D.   On: 10/15/2018 23:00     I independently reviewed the above imaging studies.  Impression/Recommendation: 1) Right renal and ureteral calculi:  She has very large burden right renal and ureteral stone burden that will not pass.  I recommended right ureteral stent placement to improve her pain as a temporizing measure.  I discussed the potential benefits and risks of the procedure, side effects of the proposed treatment, the likelihood of the patient achieving the goals of the procedure, and any potential problems that might occur during the procedure or recuperation.  Unfortunately, she at lunch and so her procedure will need to be delayed (had planned to do this afternoon).  Her pain is currently controlled and so she will be scheduled for stent placement tomorrow by Dr. Louis Meckel.  We discussed definitive management of her stone can be done at a later date (she is concerned about COVID -76) and will likely involve multiple procedures including percutaneous and ureteroscopic intervention.  Her ability to undergo a percutaneous procedure will need to be assessed after further assessment of her bleeding risk considering her history of liver disease and thrombocytopenia.  Following stent placement, she can be discharged with pain medication and  she should follow up with me as an outpatient anytime over the next month.  I am not convinced she has a UTI.  Considering she never had a UA before starting antibiotics, it is difficult to know but her symptoms are consistent with the large stone in her intravesical ureter.  Dutch Gray 10/16/2018, 5:25 PM  Pryor Curia. MD   CC: Dr. Dyann Kief

## 2018-10-17 ENCOUNTER — Inpatient Hospital Stay (HOSPITAL_COMMUNITY): Payer: Medicare Other | Admitting: Anesthesiology

## 2018-10-17 ENCOUNTER — Encounter (HOSPITAL_COMMUNITY)
Admission: EM | Disposition: A | Discharge status: Home / Self Care | Payer: Self-pay | Source: Home / Self Care | Attending: Internal Medicine

## 2018-10-17 ENCOUNTER — Inpatient Hospital Stay (HOSPITAL_COMMUNITY): Payer: Medicare Other

## 2018-10-17 DIAGNOSIS — K7469 Other cirrhosis of liver: Secondary | ICD-10-CM

## 2018-10-17 DIAGNOSIS — N132 Hydronephrosis with renal and ureteral calculous obstruction: Secondary | ICD-10-CM

## 2018-10-17 DIAGNOSIS — I10 Essential (primary) hypertension: Secondary | ICD-10-CM

## 2018-10-17 HISTORY — PX: CYSTOSCOPY WITH RETROGRADE PYELOGRAM, URETEROSCOPY AND STENT PLACEMENT: SHX5789

## 2018-10-17 LAB — BASIC METABOLIC PANEL
Anion gap: 6 (ref 5–15)
BUN: 15 mg/dL (ref 8–23)
CO2: 21 mmol/L — ABNORMAL LOW (ref 22–32)
Calcium: 8.5 mg/dL — ABNORMAL LOW (ref 8.9–10.3)
Chloride: 112 mmol/L — ABNORMAL HIGH (ref 98–111)
Creatinine, Ser: 1.02 mg/dL — ABNORMAL HIGH (ref 0.44–1.00)
GFR calc Af Amer: >60 mL/min (ref >60)
GFR calc non Af Amer: 57 mL/min — ABNORMAL LOW (ref >60)
Glucose, Bld: 93 mg/dL (ref 70–99)
Potassium: 4.2 mmol/L (ref 3.5–5.1)
Sodium: 139 mmol/L (ref 135–145)

## 2018-10-17 LAB — URINE CULTURE: Culture: <10000 — AB

## 2018-10-17 LAB — HIV ANTIBODY (ROUTINE TESTING W REFLEX): HIV Screen 4th Generation wRfx: NONREACTIVE

## 2018-10-17 IMAGING — RF RETROGRADE PYELOGRAM
1 series · 6 of 6 positions shown · non-contrast
Comparison: CT [DATE]

CLINICAL DATA: uretheral stone

EXAM:
RETROGRADE PYELOGRAM

[Series 1: run · 6 of 6 slices shown]
[im 1/6]
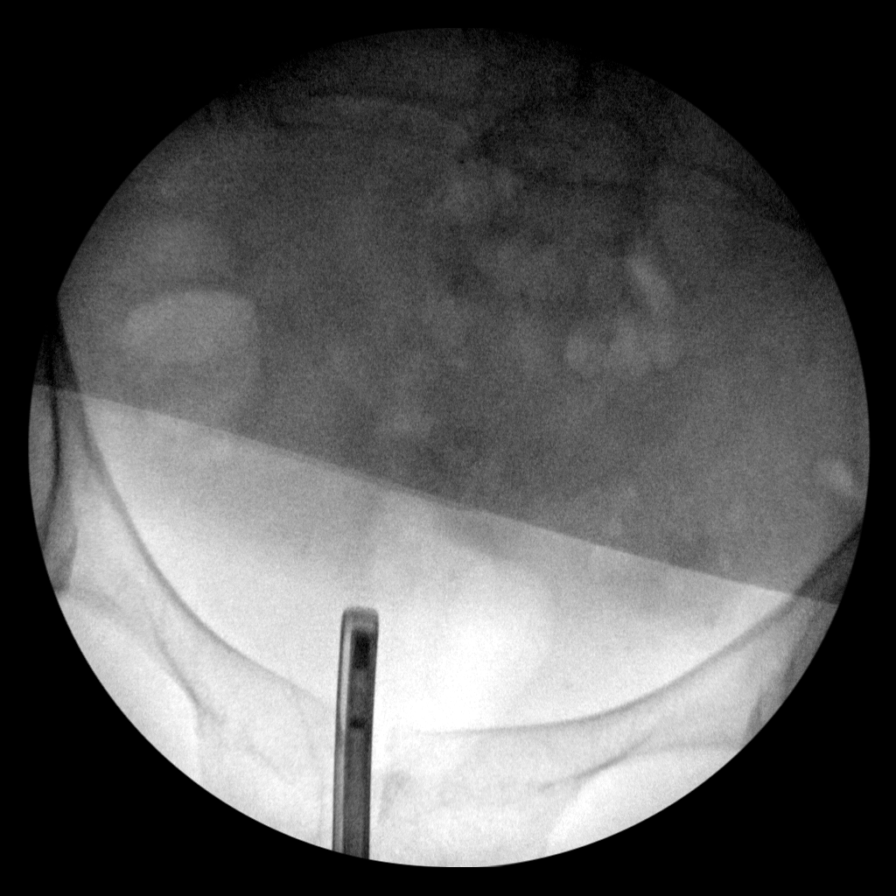
[im 2/6]
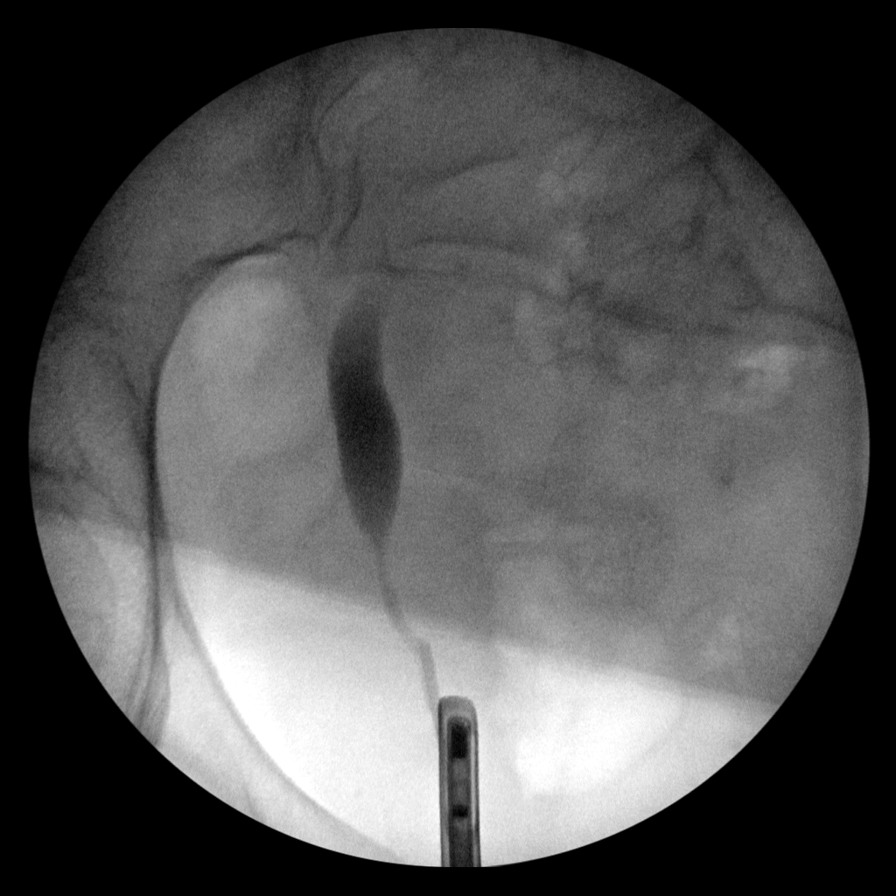
[im 3/6]
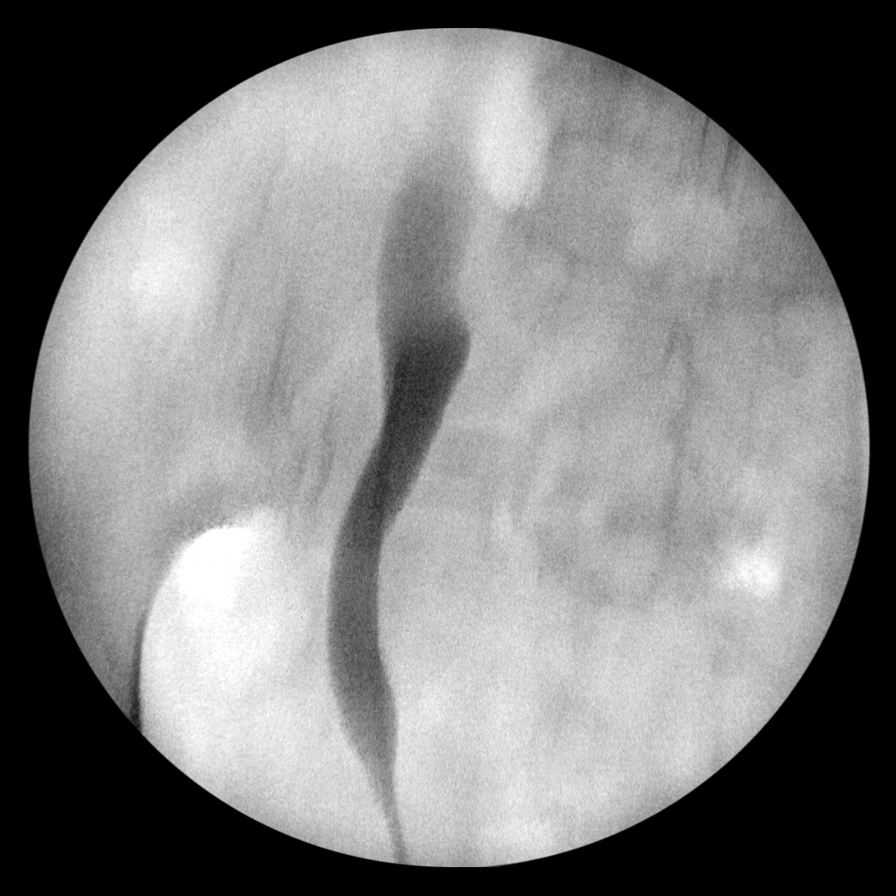
[im 4/6]
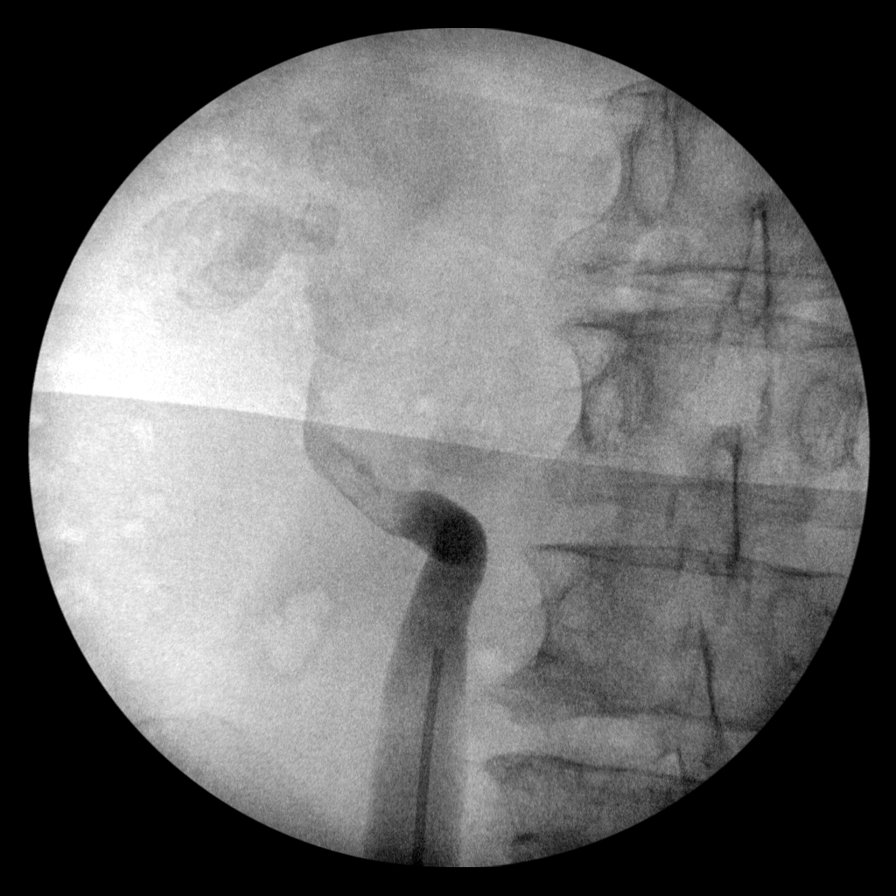
[im 5/6]
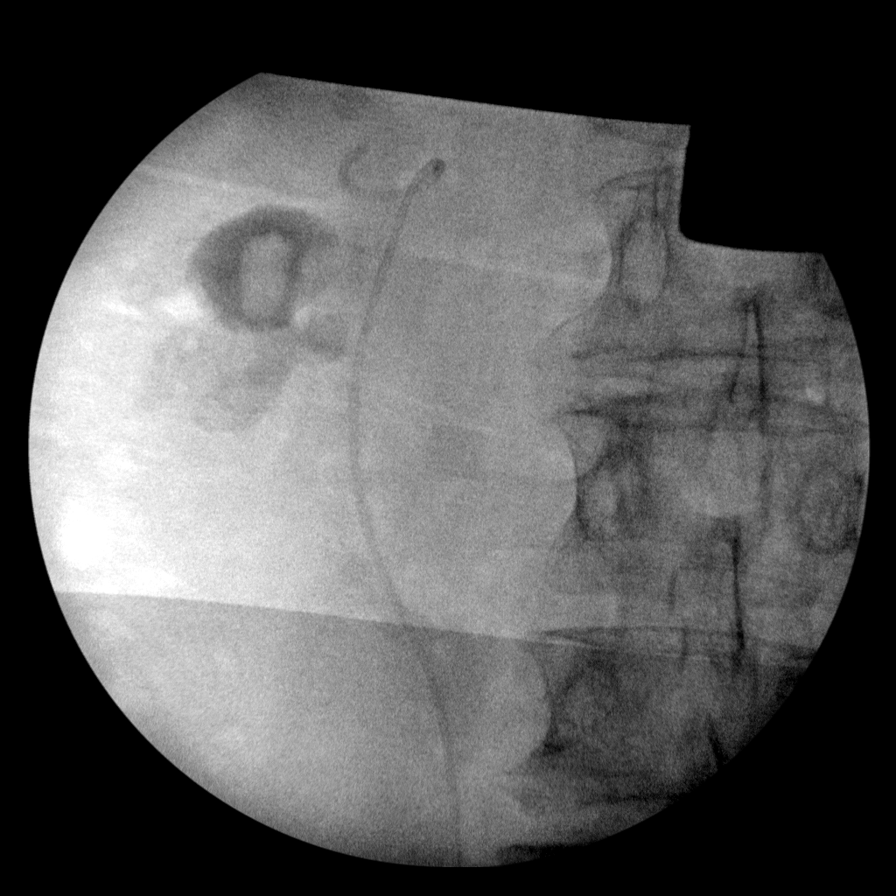
[im 6/6]
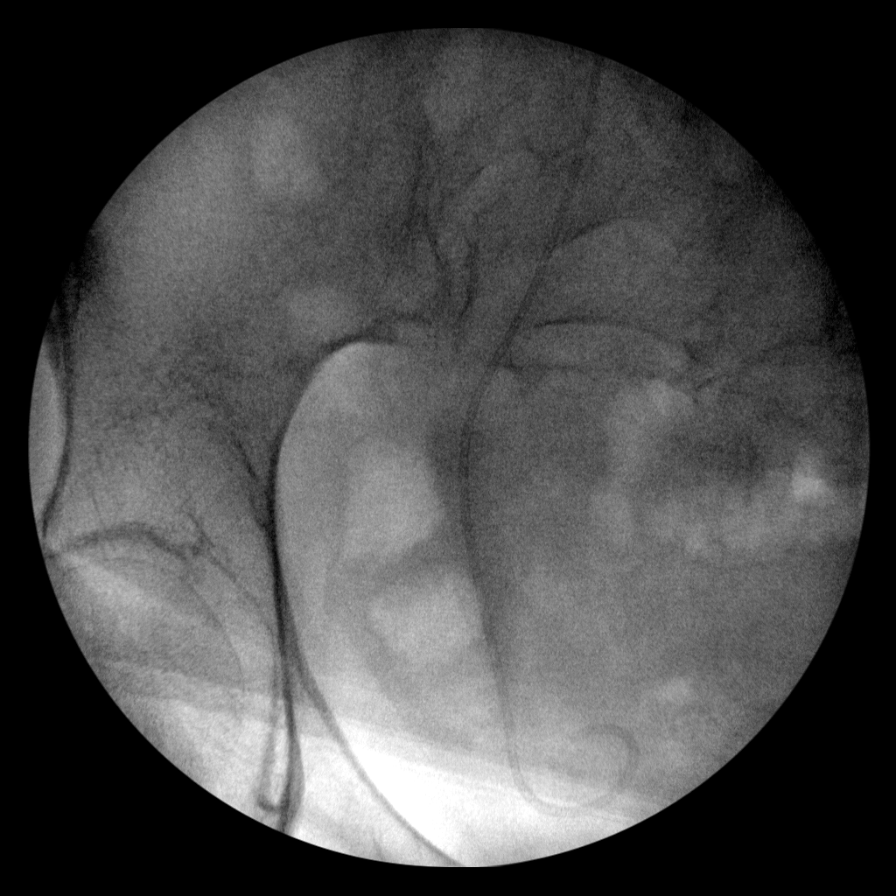

[6 of 6 positions shown; findings below may reference images not displayed]

FINDINGS: Submitteda series of 6 fluoroscopic spot images, documenting
retrograde cannulation and opacification of the ureter with multiple
renal calculi noted in the right collecting system, subsequent
placement of double-J ureteral stent.
IMPRESSION: 1. Right nephrolithiasis with ureteral stent placement.

## 2018-10-17 SURGERY — CYSTOURETEROSCOPY, WITH RETROGRADE PYELOGRAM AND STENT INSERTION
Anesthesia: General | Laterality: Right

## 2018-10-17 MED ORDER — PROPOFOL 10 MG/ML IV BOLUS
INTRAVENOUS | Status: DC | PRN
  Administered 2018-10-17: 150 mg via INTRAVENOUS

## 2018-10-17 MED ORDER — GUAIFENESIN 100 MG/5ML PO SOLN
5.0000 mL | ORAL | Status: DC | PRN
  Administered 2018-10-17 – 2018-10-18 (×2): 100 mg via ORAL
  Filled 2018-10-17: qty 5
  Filled 2018-10-17: qty 25

## 2018-10-17 MED ORDER — HYDROCODONE-ACETAMINOPHEN 7.5-325 MG PO TABS: 1.0000 | ORAL_TABLET | Freq: Once | ORAL | Status: DC | PRN

## 2018-10-17 MED ORDER — PROPOFOL 10 MG/ML IV BOLUS
INTRAVENOUS | Status: AC
  Filled 2018-10-17: qty 20

## 2018-10-17 MED ORDER — FENTANYL CITRATE (PF) 250 MCG/5ML IJ SOLN
INTRAMUSCULAR | Status: AC
  Filled 2018-10-17: qty 5

## 2018-10-17 MED ORDER — HYDROMORPHONE HCL 1 MG/ML IJ SOLN: 0.2500 mg | INTRAMUSCULAR | Status: DC | PRN

## 2018-10-17 MED ORDER — STERILE WATER FOR IRRIGATION IR SOLN
Status: DC | PRN
  Administered 2018-10-17: 1000 mL

## 2018-10-17 MED ORDER — LACTATED RINGERS IV SOLN: INTRAVENOUS | Status: DC

## 2018-10-17 MED ORDER — MIDAZOLAM HCL 2 MG/2ML IJ SOLN: 0.5000 mg | Freq: Once | INTRAMUSCULAR | Status: DC | PRN

## 2018-10-17 MED ORDER — SUCCINYLCHOLINE CHLORIDE 20 MG/ML IJ SOLN
INTRAMUSCULAR | Status: DC | PRN
  Administered 2018-10-17: 100 mg via INTRAVENOUS

## 2018-10-17 MED ORDER — SODIUM CHLORIDE 0.9 % IR SOLN
Status: DC | PRN
  Administered 2018-10-17: 3000 mL

## 2018-10-17 MED ORDER — FENTANYL CITRATE (PF) 100 MCG/2ML IJ SOLN
INTRAMUSCULAR | Status: DC | PRN
  Administered 2018-10-17: 75 ug via INTRAVENOUS

## 2018-10-17 MED ORDER — DIATRIZOATE MEGLUMINE 30 % UR SOLN
URETHRAL | Status: AC
  Filled 2018-10-17: qty 100

## 2018-10-17 MED ORDER — PROMETHAZINE HCL 25 MG/ML IJ SOLN: 6.2500 mg | INTRAMUSCULAR | Status: DC | PRN

## 2018-10-17 SURGICAL SUPPLY — 18 items
BAG DRAIN URO TABLE W/ADPT NS (BAG) ×2 IMPLANT
BAG DRN 8 ADPR NS SKTRN CSTL (BAG) ×1
CATH INTERMIT  6FR 70CM (CATHETERS) ×2 IMPLANT
CLOTH BEACON ORANGE TIMEOUT ST (SAFETY) ×2 IMPLANT
GLOVE BIO SURGEON STRL SZ 6.5 (GLOVE) ×1 IMPLANT
GLOVE BIO SURGEON STRL SZ7.5 (GLOVE) ×2 IMPLANT
GLOVE BIO SURGEONS STRL SZ 6.5 (GLOVE) ×1
GLOVE BIOGEL PI IND STRL 6.5 (GLOVE) IMPLANT
GLOVE BIOGEL PI IND STRL 7.0 (GLOVE) IMPLANT
GLOVE BIOGEL PI INDICATOR 6.5 (GLOVE) ×2
GLOVE BIOGEL PI INDICATOR 7.0 (GLOVE) ×2
GOWN STRL REUS W/TWL LRG LVL3 (GOWN DISPOSABLE) ×4 IMPLANT
GUIDEWIRE STR DUAL SENSOR (WIRE) ×2 IMPLANT
KIT TURNOVER CYSTO (KITS) ×2 IMPLANT
MANIFOLD NEPTUNE II (INSTRUMENTS) ×2 IMPLANT
PACK CYSTO (CUSTOM PROCEDURE TRAY) ×2 IMPLANT
PAD ARMBOARD 7.5X6 YLW CONV (MISCELLANEOUS) ×2 IMPLANT
STENT PERCUFLEX 4.8FRX24 (STENTS) ×2 IMPLANT

## 2018-10-17 NOTE — Transfer of Care (Signed)
Immediate Anesthesia Transfer of Care Note  Patient: Alyssa Pena  Procedure(s) Performed: CYSTOSCOPY WITH RETROGRADE PYELOGRAM, URETEROSCOPY AND STENT PLACEMENT (Right )  Patient Location: PACU  Anesthesia Type:General  Level of Consciousness: awake and alert   Airway & Oxygen Therapy: Patient Spontanous Breathing  Post-op Assessment: Report given to RN  Post vital signs: Reviewed and stable  Last Vitals:  Vitals Value Taken Time  BP 119/102 10/17/2018 11:22 AM  Temp 36.7 C 10/17/2018 11:22 AM  Pulse 66 10/17/2018 11:22 AM  Resp 16 10/17/2018 11:22 AM  SpO2 93 % 10/17/2018 11:22 AM    Last Pain:  Vitals:   10/17/18 1122  TempSrc:   PainSc: 0-No pain         Complications: No apparent anesthesia complications

## 2018-10-17 NOTE — Progress Notes (Addendum)
Right ureteral stent successfully placed.  Patient will likely feel signicantly better.  Would consider discharge as early as this afternoon.  Her urine culture was negative and thus I don't feel strongly about her being discharged on antibiotics.  We will set her up for f/u office visit to plan definitive management of her large right sided stone burden in the near future.  She will need clearance from her cardiologist prior.

## 2018-10-17 NOTE — Op Note (Signed)
Preoperative diagnosis:  1. Large right sided obstructing ureteral/renal stones   Postoperative diagnosis:  1. same   Procedure:  1. Cystoscopy 2. right ureteral stent placement 3. right retrograde pyelography with interpretation   Surgeon: Ardis Hughs, MD  Anesthesia: General  Complications: None  Intraoperative findings:   #1. Right retrograde pyelography demonstrated a large filling defect in the distal ureter as well as the proximal ureter/UPJ.  She had severe proximal and mid ureteral dilation and tortuosity.  within the right ureter consistent with the patient's known calculus without other abnormalities. #2.  4.8Fx 24cm double J stent easily passed up the right ureter.  EBL: Minimal  Specimens: None  Indication: Alyssa Pena is a 68 y.o. patient with large right sided ureteral/renal stone with hydronephrosis and severe poorly controlled pain. After reviewing the management options for treatment, he elected to proceed with the above surgical procedure(s). We have discussed the potential benefits and risks of the procedure, side effects of the proposed treatment, the likelihood of the patient achieving the goals of the procedure, and any potential problems that might occur during the procedure or recuperation. Informed consent has been obtained.  Description of procedure:  The patient was taken to the operating room and general anesthesia was induced.  The patient was placed in the dorsal lithotomy position, prepped and draped in the usual sterile fashion, and preoperative antibiotics were administered. A preoperative time-out was performed.   Cystourethroscopy was performed.  The patient's urethra was examined and was normal. The bladder was then systematically examined in its entirety. There was no evidence for any bladder tumors, stones, or other mucosal pathology.    Attention then turned to the rightureteral orifice and a ureteral catheter was used to intubate the  ureteral orifice.  Omnipaque contrast was injected through the ureteral catheter and a retrograde pyelogram was performed with findings as dictated above.  A 0.38 sensor guidewire was then advanced up the right ureter into the renal pelvis under fluoroscopic guidance.  The wire was then backloaded through the cystoscope and a ureteral stent was advance over the wire using Seldinger technique.  The stent was positioned appropriately under fluoroscopic and cystoscopic guidance.  The wire was then removed with an adequate stent curl noted in the renal pelvis as well as in the bladder.  The bladder was then emptied and the procedure ended.  The patient appeared to tolerate the procedure well and without complications.  The patient was able to be awakened and transferred to the recovery unit in satisfactory condition.    Ardis Hughs, M.D.

## 2018-10-17 NOTE — Anesthesia Postprocedure Evaluation (Signed)
Anesthesia Post Note  Patient: Alyssa Pena  Procedure(s) Performed: CYSTOSCOPY WITH RETROGRADE PYELOGRAM, URETEROSCOPY AND STENT PLACEMENT (Right )  Patient location during evaluation: PACU Anesthesia Type: General Level of consciousness: awake and awake and alert Pain management: pain level controlled Vital Signs Assessment: post-procedure vital signs reviewed and stable Respiratory status: spontaneous breathing and nonlabored ventilation Cardiovascular status: blood pressure returned to baseline and stable Postop Assessment: no headache Anesthetic complications: no     Last Vitals:  Vitals:   10/17/18 1040 10/17/18 1122  BP: (!) 139/48 (!) 119/102  Pulse: 63 66  Resp: 16 16  Temp: 36.8 C 36.7 C  SpO2: 93% 93%    Last Pain:  Vitals:   10/17/18 1122  TempSrc:   PainSc: 0-No pain                 [place] Jelina Paulsen

## 2018-10-17 NOTE — Progress Notes (Signed)
Awake. Needs to void. Ambulated to BR. Voided without difficulty. Tolerated well.

## 2018-10-17 NOTE — Interval H&P Note (Signed)
History and Physical Interval Note:  10/17/2018 10:44 AM  Alyssa Pena  has presented today for surgery, with the diagnosis of ureteral stone.  The various methods of treatment have been discussed with the patient and family. After consideration of risks, benefits and other options for treatment, the patient has consented to  Procedure(s): CYSTOSCOPY WITH RETROGRADE PYELOGRAM, URETEROSCOPY AND STENT PLACEMENT (Right) as a surgical intervention.  The patient's history has been reviewed, patient examined, no change in status, stable for surgery.  I have reviewed the patient's chart and labs.  Questions were answered to the patient's satisfaction.     Ardis Hughs

## 2018-10-17 NOTE — Discharge Instructions (Signed)
DISCHARGE INSTRUCTIONS FOR KIDNEY STONE/URETERAL STENT   MEDICATIONS:  1.  Resume all your other meds from home - except do not take any extra narcotic pain meds that you may have at home.    ACTIVITY:  1. No strenuous activity x 1week  2. No driving while on narcotic pain medications  3. Drink plenty of water  4. Continue to walk at home - you can still get blood clots when you are at home, so keep active, but don't over do it.  5. May return to work/school tomorrow or when you feel ready   BATHING:  1. You can shower and we recommend daily showers   SIGNS/SYMPTOMS TO CALL:  Please call us if you have a fever greater than 101.5, uncontrolled nausea/vomiting, uncontrolled pain, dizziness, unable to urinate, bloody urine, chest pain, shortness of breath, leg swelling, leg pain, redness around wound, drainage from wound, or any other concerns or questions.   You can reach [place] at [phone].   FOLLOW-UP:  1. We will schedule you for f/u in the [facility] within the next few weeks for further discussion of treatment options.

## 2018-10-17 NOTE — Progress Notes (Signed)
PROGRESS NOTE    Alyssa Pena  ONG:[id number] DOB: [birth year] DOA: 10/15/2018 PCP: Neale Burly, MD     Brief Narrative:  68 year old female admitted secondary to worsening right flank pain, urinary frequency and dysuria.  Patient with a recent outpatient diagnosis of UTI treated with Bactrim.  Blood work demonstrated worsening renal function and her CT scan show a staghorn calculi that is distended into the ureter with right hydronephrosis.  Please refer to H&P written by Dr. Darrick Meigs for further info/details on her admission.  Patient was brought into the hospital for ureteral stenting by urology, empirically cover with IV levaquin and to follow improvement in her renal function.    Assessment & Plan: 1-acute obstructive uropathy/right hydronephrosis/staghorn calculi -Fluid resuscitation given on admission and Empirically treated with Levaquin -Patient currently afebrile -Reports no nausea, no vomiting -Status post ureteral stent by urology -Continue as needed pain medications -Still having right flank pain. -Will discontinue antibiotics -Advance diet and follow clinical response -Most likely home in a.m.  2-acute kidney injury -Most likely secondary to obstructive uropathy -Status post IV fluids and ureteral stenting -Creatinine trending down appropriately -Repeat basic metabolic panel in a.m. -Definitive treatment for her nephrolithiasis to be done by urology as an outpatient.  3-history of liver cirrhosis -Appears to be compensated -Low-sodium diet has been encouraged. -Continue outpatient follow-up with gastroenterology.  4-essential hypertension -Blood pressure is stable -Continue current antihypertensive agents (Coreg twice a day). -Heart healthy diet has been encouraged.  5-depression and/anxiety -No suicidal ideation -No hallucinations -Stable mood. -Continue Prozac and as needed lorazepam.   DVT prophylaxis: SCDs Code Status: Full code Family Communication:  No family at bedside. Disposition Plan: Will observe overnight, discontinue antibiotics, advance diet, repeat basic metabolic panel in the morning.  Patient will require outpatient follow-up with urology service for definitive treatment of increased size nephrolithiasis.  Consultants:   Urology service  Procedures:   Right ureteral stenting 10/17/18  Antimicrobials:  Anti-infectives (From admission, onward)   Start     Dose/Rate Route Frequency Ordered Stop   10/17/18 0600  Levofloxacin (LEVAQUIN) IVPB 250 mg  Status:  Discontinued     250 mg 50 mL/hr over 60 Minutes Intravenous Every 24 hours 10/16/18 0744 10/17/18 1615   10/16/18 0600  levofloxacin (LEVAQUIN) IVPB 500 mg  Status:  Discontinued     500 mg 100 mL/hr over 60 Minutes Intravenous Every 24 hours 10/16/18 0545 10/16/18 0744   10/15/18 2245  cephALEXin (KEFLEX) capsule 500 mg     500 mg Oral  Once 10/15/18 2233 10/15/18 2242       Subjective: No feeling well, experiencing intermittent episodes of dry coughing spells, afebrile.  Reports no nausea, no vomiting.  Status post right ureteral stenting.  Objective: Vitals:   10/17/18 1130 10/17/18 1145 10/17/18 1200 10/17/18 1300  BP: (!) 113/44 (!) 110/52 (!) 111/44 (!) 110/51  Pulse: 63 63 64 63  Resp: 16 16 16 16   Temp:      TempSrc:      SpO2: 93% 93% 93% 92%  Weight:      Height:        Intake/Output Summary (Last 24 hours) at 10/17/2018 1617 Last data filed at 10/17/2018 1200 Gross per 24 hour  Intake 807.9 ml  Output -  Net 807.9 ml   Filed Weights   10/15/18 2022  Weight: 81.6 kg    Examination: General exam: Alert, awake, oriented x 3; afebrile, reports no nausea, no vomiting.  Still having increased  frequency and right flank pain.  No frank dysuria reported today.  After her stenting placement experiencing coughing spells and not feeling well. Respiratory system: Clear to auscultation. Respiratory effort normal. Cardiovascular system: RRR. No murmurs,  rubs, gallops. Gastrointestinal system: Abdomen is nondistended, soft and mild tender to palpation on right flank. No organomegaly or masses felt. Normal bowel sounds heard. Central nervous system: Alert and oriented. No focal neurological deficits. Extremities: No C/C/E, +pedal pulses Skin: No rashes, lesions or ulcers Psychiatry: Judgement and insight appear normal. Mood & affect appropriate.    Data Reviewed: I have personally reviewed following labs and imaging studies  CBC: Recent Labs  Lab 10/15/18 2355 10/16/18 0555  WBC 9.8 8.2  NEUTROABS 6.6  --   HGB 12.5 11.6*  HCT 35.9* 35.2*  MCV 95.0 98.1  PLT 74* 65*   Basic Metabolic Panel: Recent Labs  Lab 10/15/18 2355 10/16/18 0555 10/17/18 1502  NA 133* 136 139  K 3.8 3.7 4.2  CL 109 110 112*  CO2 18* 21* 21*  GLUCOSE 94 91 93  BUN 17 18 15   CREATININE 1.63* 1.58* 1.02*  CALCIUM 8.6* 8.6* 8.5*   GFR: Estimated Creatinine Clearance: 56.4 mL/min (A) (by C-G formula based on SCr of 1.02 mg/dL (H)).   Liver Function Tests: Recent Labs  Lab 10/16/18 0555  AST 25  ALT 16  ALKPHOS 60  BILITOT 1.9*  PROT 6.1*  ALBUMIN 2.5*   Urine analysis:    Component Value Date/Time   COLORURINE AMBER (A) 10/15/2018 2029   APPEARANCEUR CLEAR 10/15/2018 2029   LABSPEC  10/15/2018 2029    TEST NOT REPORTED DUE TO COLOR INTERFERENCE OF URINE PIGMENT   PHURINE  10/15/2018 2029    TEST NOT REPORTED DUE TO COLOR INTERFERENCE OF URINE PIGMENT   GLUCOSEU (A) 10/15/2018 2029    TEST NOT REPORTED DUE TO COLOR INTERFERENCE OF URINE PIGMENT   HGBUR (A) 10/15/2018 2029    TEST NOT REPORTED DUE TO COLOR INTERFERENCE OF URINE PIGMENT   BILIRUBINUR (A) 10/15/2018 2029    TEST NOT REPORTED DUE TO COLOR INTERFERENCE OF URINE PIGMENT   KETONESUR (A) 10/15/2018 2029    TEST NOT REPORTED DUE TO COLOR INTERFERENCE OF URINE PIGMENT   PROTEINUR (A) 10/15/2018 2029    TEST NOT REPORTED DUE TO COLOR INTERFERENCE OF URINE PIGMENT   NITRITE  (A) 10/15/2018 2029    TEST NOT REPORTED DUE TO COLOR INTERFERENCE OF URINE PIGMENT   LEUKOCYTESUR (A) 10/15/2018 2029    TEST NOT REPORTED DUE TO COLOR INTERFERENCE OF URINE PIGMENT    Recent Results (from the past 240 hour(s))  Urine culture     Status: Abnormal   Collection Time: 10/15/18 10:33 PM  Result Value Ref Range Status   Specimen Description   Final    URINE, CLEAN CATCH Performed at [facility], [address], [place], [place] [address]    Special Requests   Final    NONE Performed at [facility], [address], [place], [place] [address]    Culture (A)  Final    <10,000 COLONIES/mL INSIGNIFICANT GROWTH Performed at [facility], [place] [address], [place], [place] [address]    Report Status 10/17/2018 FINAL  Final     Radiology Studies: Dg Retrograde Pyelogram  Result Date: 10/17/2018 CLINICAL DATA:  uretheral stone EXAM: RETROGRADE PYELOGRAM COMPARISON:  CT 10/15/2018 FINDINGS: Submitteda series of 6 fluoroscopic spot images, documenting retrograde cannulation and opacification of the ureter with multiple renal  calculi noted in the right collecting system, subsequent placement of double-J ureteral stent. IMPRESSION: 1. Right nephrolithiasis with ureteral stent placement. Electronically Signed   By: Lucrezia Europe M.D.   On: 10/17/2018 12:39   Ct Renal Stone Study  Result Date: 10/15/2018 CLINICAL DATA:  68 year old female with right flank pain. EXAM: CT ABDOMEN AND PELVIS WITHOUT CONTRAST TECHNIQUE: Multidetector CT imaging of the abdomen and pelvis was performed following the standard protocol without IV contrast. COMPARISON:  CT Abdomen and Pelvis 08/10/2013. FINDINGS: Lower chest: Negative. No pericardial or pleural effusion. Hepatobiliary: Nodular, cirrhotic liver. No discrete liver lesion. Calcified gallstones individually up to 12 millimeters. No pericholecystic inflammation. Bile duct size appears stable. Pancreas: Negative. Spleen: Chronic splenomegaly and  prominent perisplenic varices. Gastrosplenic and splenorenal collaterals. Adrenals/Urinary Tract: Left adrenal gland obscured by chronic varices. Bulky left nephrolithiasis with individual calculi up to 8 millimeters but no left hydronephrosis. Decompressed left ureter. Severe right hydronephrosis and perinephric stranding. Likely forniceal rupture. Nephromegaly. Staghorn intrarenal calculi individually up to 3 centimeters. Just distal to the ureteropelvic junction there is a 17 millimeter staghorn calculus (series 2, image 38). But severe right hydroureter (20 millimeters diameter) continues to the pelvis where there are clustered calculi in the distal ureter which individually might measure up to 9 millimeters (series 2, image 67), and a bulky 14 millimeter calculus lodged at the ureterovesical junction (series 2, image 72). Decompressed urinary bladder. Superimposed bilateral pelvic phleboliths. Stomach/Bowel: Decompressed rectosigmoid colon. Redundant sigmoid with mild diverticulosis. No active inflammation. Negative transverse and descending colon. Negative right colon and appendix. Negative terminal ileum. No dilated small bowel. Fluid-filled distal esophagus, but the stomach is largely decompressed. No free air. Vascular/Lymphatic: Vascular patency is not evaluated in the absence of IV contrast. Aortoiliac calcified atherosclerosis. Reproductive: Surgically absent uterus. Diminutive or absent ovaries. Other: Trace if any pelvic free fluid. Musculoskeletal: Osteopenia. No acute osseous abnormality identified. IMPRESSION: 1. Severe Acute Obstructive Uropathy on the right with Forniceal Rupture. Bulky, staghorn-like calculi up to 17 mm are lodged at both the right UPJ and UVJ. Additional clustered stones in the dilated distal ureter (steinstrasse). Additional up to 3 centimeter staghorn calculi within the right kidney. 2. Lesser left nephrolithiasis. 3. Chronic cirrhosis and stigmata of portal venous  hypertension including splenomegaly and perisplenic varices. 4. Cholelithiasis. Electronically Signed   By: Genevie Ann M.D.   On: 10/15/2018 23:00    Scheduled Meds: . carvedilol  6.25 mg Oral BID WC  . FLUoxetine  20 mg Oral Daily  . LORazepam  1 mg Oral BID   Continuous Infusions: . sodium chloride 10 mL/hr at 10/16/18 0557  . sodium chloride 75 mL/hr at 10/16/18 1605     LOS: 1 day    Time spent: 30 minutes   Barton Dubois, MD [facility] Pager [phone]  10/17/2018, 4:17 PM

## 2018-10-18 DIAGNOSIS — N201 Calculus of ureter: Secondary | ICD-10-CM

## 2018-10-18 DIAGNOSIS — I10 Essential (primary) hypertension: Secondary | ICD-10-CM

## 2018-10-18 LAB — BASIC METABOLIC PANEL
Anion gap: 6 (ref 5–15)
BUN: 17 mg/dL (ref 8–23)
CO2: 22 mmol/L (ref 22–32)
Calcium: 8.5 mg/dL — ABNORMAL LOW (ref 8.9–10.3)
Chloride: 111 mmol/L (ref 98–111)
Creatinine, Ser: 0.93 mg/dL (ref 0.44–1.00)
GFR calc Af Amer: >60 mL/min (ref >60)
GFR calc non Af Amer: >60 mL/min (ref >60)
Glucose, Bld: 84 mg/dL (ref 70–99)
Potassium: 3.9 mmol/L (ref 3.5–5.1)
Sodium: 139 mmol/L (ref 135–145)

## 2018-10-18 MED ORDER — HYDROCODONE-ACETAMINOPHEN 5-325 MG PO TABS: 1.0000 | ORAL_TABLET | Freq: Three times a day (TID) | ORAL | 0 refills | Status: AC | PRN

## 2018-10-18 MED ORDER — GUAIFENESIN 100 MG/5ML PO SOLN: 5.0000 mL | Freq: Four times a day (QID) | ORAL | 0 refills | Status: DC | PRN

## 2018-10-18 NOTE — Progress Notes (Signed)
Removed IV-clean, dry, intact. Reviewed d/c paperwork with patient. Reviewed new medications and med changes. Answered all questions. [place] wheeled stable patient and belongings to short stay entrance where she was picked up by her son to d/c to home.

## 2018-10-18 NOTE — Discharge Summary (Signed)
Physician Discharge Summary  Alyssa Pena OHY:[id number] DOB: [birth year] DOA: 10/15/2018  PCP: Alyssa Burly, MD  Admit date: 10/15/2018 Discharge date: 10/18/2018  Time spent: 35 minutes  Recommendations for Outpatient Follow-up:  1. Repeat basic metabolic panel to follow renal function trend and electrolytes 2. Reassess blood pressure and further adjust as needed.   Discharge Diagnoses:  Active Problems:   AKI (acute kidney injury) ([place])   Ureteral calculus, right   Benign essential HTN Depression/anxiety Nonalcoholic cirrhosis without ascites   Discharge Condition: Stable and improved.  Patient discharged home with instruction to follow-up with PCP and urology service as an outpatient.  Diet recommendation: Heart healthy diet.  Filed Weights   10/15/18 2022  Weight: 81.6 kg    Brief history of present illness:  68 year old female admitted secondary to worsening right flank pain, urinary frequency and dysuria.  Patient with a recent outpatient diagnosis of UTI treated with Bactrim.  Blood work demonstrated worsening renal function and her CT scan show a staghorn calculi that is distended into the ureter with right hydronephrosis.  Please refer to H&P written by Alyssa Pena for further info/details on her admission.  Patient was brought into the hospital for ureteral stenting by urology, empirically cover with IV levaquin and to follow improvement in her renal function.   Hospital Course:  1-acute obstructive uropathy/right hydronephrosis/staghorn calculi -Fluid resuscitation given on admission and Empirically treated with Levaquin -Patient currently afebrile -Reports no nausea, no vomiting -Status post ureteral stent by urology -Continue as needed pain medications -Still having right flank pain intermittently; but much improved. -No antibiotics given at time of discharge.  2-acute kidney injury -Most likely secondary to obstructive uropathy -Status post IV fluids and  ureteral stenting -Renal function is completely back to normal at time of discharge. -Will recommend repeat basic metabolic panel follow-up visit to reassess renal function and stability. -Definitive treatment for her nephrolithiasis to be done by urology as an outpatient.  3-history of liver cirrhosis -Appears to be compensated -Low-sodium diet has been encouraged. -Continue outpatient follow-up with gastroenterology service.  4-essential hypertension -Blood pressure is stable -Continue current antihypertensive agents (Coreg twice a day). -Heart healthy diet has been encouraged.  5-depression and/anxiety -No suicidal ideation -No hallucinations -Stable mood. -Continue Prozac and as needed lorazepam.   Procedures:  Right ureteral stent in 5-20  Consultations:  Urology  Discharge Exam: Vitals:   10/18/18 0541 10/18/18 0915  BP: 138/65   Pulse: (!) 57   Resp:    Temp: 98.2 F (36.8 C)   SpO2: 96% 97%   General exam: Alert, awake, oriented x 3; afebrile, reports no nausea, no vomiting.  Still having some right flank pain; but denies any dysuria.  She is feeling much better and ready to go home.  Reported some costal pain bilaterally after coughing spells the night before.   Respiratory system: Clear to auscultation. Respiratory effort normal. Cardiovascular system: RRR. No murmurs, rubs, gallops. Gastrointestinal system: Abdomen is nondistended, soft and mild tender to palpation on right flank. No organomegaly or masses felt. Normal bowel sounds heard. Central nervous system: Alert and oriented. No focal neurological deficits. Extremities: No C/C/E, +pedal pulses Skin: No rashes, lesions or ulcers Psychiatry: Judgement and insight appear normal. Mood & affect appropriate.    Discharge Instructions   Discharge Instructions    Diet - low sodium heart healthy   Complete by:  As directed    Discharge instructions   Complete by:  As directed    Take  medications  as prescribed Keep yourself well-hydrated Follow-up with PCP in 2 weeks Follow-up with urology service as instructed. Follow heart healthy diet.     Allergies as of 10/18/2018      Reactions   Penicillins Hives, Other (See Comments)   Has patient had a PCN reaction causing immediate rash, facial/tongue/throat swelling, SOB or lightheadedness with hypotension: No Has patient had a PCN reaction causing severe rash involving mucus membranes or skin necrosis: No Has patient had a PCN reaction that required hospitalization No Has patient had a PCN reaction occurring within the last 10 years: No If all of the above answers are "NO", then may proceed with Cephalosporin use.      Medication List    STOP taking these medications   sulfamethoxazole-trimethoprim 800-160 MG tablet Commonly known as:  BACTRIM DS     TAKE these medications   carvedilol 6.25 MG tablet Commonly known as:  COREG Take 6.25 mg by mouth 2 (two) times daily.   FLUoxetine 20 MG capsule Commonly known as:  PROZAC Take 20 mg by mouth daily.   guaiFENesin 100 MG/5ML Soln Commonly known as:  ROBITUSSIN Take 5 mLs (100 mg total) by mouth every 6 (six) hours as needed for cough or to loosen phlegm.   HAIR/SKIN/NAILS PO Take 1 tablet by mouth daily.   HYDROcodone-acetaminophen 5-325 MG tablet Commonly known as:  NORCO/VICODIN Take 1 tablet by mouth every 8 (eight) hours as needed for up to 5 days for severe pain.   LORazepam 1 MG tablet Commonly known as:  ATIVAN Take 1 mg by mouth 2 (two) times daily.      Allergies  Allergen Reactions  . Penicillins Hives and Other (See Comments)    Has patient had a PCN reaction causing immediate rash, facial/tongue/throat swelling, SOB or lightheadedness with hypotension: No Has patient had a PCN reaction causing severe rash involving mucus membranes or skin necrosis: No Has patient had a PCN reaction that required hospitalization No Has patient had a PCN reaction  occurring within the last 10 years: No If all of the above answers are "NO", then may proceed with Cephalosporin use.    Follow-up Information    Alyssa Bring, MD. Schedule an appointment as soon as possible for a visit in 1 week.   Specialty:  Urology Why:  to discuss treatment for the large right sided kidney stone Contact information: [address] [place] [address] [phone]        Alyssa Burly, MD. Schedule an appointment as soon as possible for a visit in 2 week(s).   Specialty:  Internal Medicine Contact information: [place]  [address] [phone]           The results of significant diagnostics from this hospitalization (including imaging, microbiology, ancillary and laboratory) are listed below for reference.    Significant Diagnostic Studies: Dg Retrograde Pyelogram  Result Date: 10/17/2018 CLINICAL DATA:  uretheral stone EXAM: RETROGRADE PYELOGRAM COMPARISON:  CT 10/15/2018 FINDINGS: Submitteda series of 6 fluoroscopic spot images, documenting retrograde cannulation and opacification of the ureter with multiple renal calculi noted in the right collecting system, subsequent placement of double-J ureteral stent. IMPRESSION: 1. Right nephrolithiasis with ureteral stent placement. Electronically Signed   By: Lucrezia Europe M.D.   On: 10/17/2018 12:39   Ct Renal Stone Study  Result Date: 10/15/2018 CLINICAL DATA:  68 year old female with right flank pain. EXAM: CT ABDOMEN AND PELVIS WITHOUT CONTRAST TECHNIQUE: Multidetector CT imaging of the  abdomen and pelvis was performed following the standard protocol without IV contrast. COMPARISON:  CT Abdomen and Pelvis 08/10/2013. FINDINGS: Lower chest: Negative. No pericardial or pleural effusion. Hepatobiliary: Nodular, cirrhotic liver. No discrete liver lesion. Calcified gallstones individually up to 12 millimeters. No pericholecystic inflammation. Bile duct size appears stable. Pancreas: Negative.  Spleen: Chronic splenomegaly and prominent perisplenic varices. Gastrosplenic and splenorenal collaterals. Adrenals/Urinary Tract: Left adrenal gland obscured by chronic varices. Bulky left nephrolithiasis with individual calculi up to 8 millimeters but no left hydronephrosis. Decompressed left ureter. Severe right hydronephrosis and perinephric stranding. Likely forniceal rupture. Nephromegaly. Staghorn intrarenal calculi individually up to 3 centimeters. Just distal to the ureteropelvic junction there is a 17 millimeter staghorn calculus (series 2, image 38). But severe right hydroureter (20 millimeters diameter) continues to the pelvis where there are clustered calculi in the distal ureter which individually might measure up to 9 millimeters (series 2, image 67), and a bulky 14 millimeter calculus lodged at the ureterovesical junction (series 2, image 72). Decompressed urinary bladder. Superimposed bilateral pelvic phleboliths. Stomach/Bowel: Decompressed rectosigmoid colon. Redundant sigmoid with mild diverticulosis. No active inflammation. Negative transverse and descending colon. Negative right colon and appendix. Negative terminal ileum. No dilated small bowel. Fluid-filled distal esophagus, but the stomach is largely decompressed. No free air. Vascular/Lymphatic: Vascular patency is not evaluated in the absence of IV contrast. Aortoiliac calcified atherosclerosis. Reproductive: Surgically absent uterus. Diminutive or absent ovaries. Other: Trace if any pelvic free fluid. Musculoskeletal: Osteopenia. No acute osseous abnormality identified. IMPRESSION: 1. Severe Acute Obstructive Uropathy on the right with Forniceal Rupture. Bulky, staghorn-like calculi up to 17 mm are lodged at both the right UPJ and UVJ. Additional clustered stones in the dilated distal ureter (steinstrasse). Additional up to 3 centimeter staghorn calculi within the right kidney. 2. Lesser left nephrolithiasis. 3. Chronic cirrhosis and  stigmata of portal venous hypertension including splenomegaly and perisplenic varices. 4. Cholelithiasis. Electronically Signed   By: Genevie Ann M.D.   On: 10/15/2018 23:00    Microbiology: Recent Results (from the past 240 hour(s))  Urine culture     Status: Abnormal   Collection Time: 10/15/18 10:33 PM  Result Value Ref Range Status   Specimen Description   Final    URINE, CLEAN CATCH Performed at [facility], [address], [place], [place] [address]    Special Requests   Final    NONE Performed at [facility], [address], [place], [place] [address]    Culture (A)  Final    <10,000 COLONIES/mL INSIGNIFICANT GROWTH Performed at [facility] [address] [place],  [address]    Report Status 10/17/2018 FINAL  Final     Labs: Basic Metabolic Panel: Recent Labs  Lab 10/15/18 2355 10/16/18 0555 10/17/18 1502 10/18/18 0813  NA 133* 136 139 139  K 3.8 3.7 4.2 3.9  CL 109 110 112* 111  CO2 18* 21* 21* 22  GLUCOSE 94 91 93 84  BUN 17 18 15 17   CREATININE 1.63* 1.58* 1.02* 0.93  CALCIUM 8.6* 8.6* 8.5* 8.5*   Liver Function Tests: Recent Labs  Lab 10/16/18 0555  AST 25  ALT 16  ALKPHOS 60  BILITOT 1.9*  PROT 6.1*  ALBUMIN 2.5*   CBC: Recent Labs  Lab 10/15/18 2355 10/16/18 0555  WBC 9.8 8.2  NEUTROABS 6.6  --   HGB 12.5 11.6*  HCT 35.9* 35.2*  MCV 95.0 98.1  PLT 74* 65*    Signed:  Barton Dubois MD.  [facility] 10/18/2018, 12:00 PM

## 2018-10-19 ENCOUNTER — Encounter (HOSPITAL_COMMUNITY): Payer: Self-pay | Admitting: Urology

## 2018-11-03 DIAGNOSIS — I5032 Chronic diastolic (congestive) heart failure: Secondary | ICD-10-CM | POA: Diagnosis not present

## 2018-11-03 DIAGNOSIS — Z0000 Encounter for general adult medical examination without abnormal findings: Secondary | ICD-10-CM | POA: Diagnosis not present

## 2018-11-03 DIAGNOSIS — I10 Essential (primary) hypertension: Secondary | ICD-10-CM | POA: Diagnosis not present

## 2018-11-03 DIAGNOSIS — Z1389 Encounter for screening for other disorder: Secondary | ICD-10-CM | POA: Diagnosis not present

## 2018-11-03 DIAGNOSIS — K7030 Alcoholic cirrhosis of liver without ascites: Secondary | ICD-10-CM | POA: Diagnosis not present

## 2018-11-12 DIAGNOSIS — N200 Calculus of kidney: Secondary | ICD-10-CM | POA: Diagnosis not present

## 2018-11-12 DIAGNOSIS — N202 Calculus of kidney with calculus of ureter: Secondary | ICD-10-CM | POA: Diagnosis not present

## 2018-11-19 ENCOUNTER — Emergency Department (HOSPITAL_COMMUNITY)
Admission: EM | Admit: 2018-11-19 | Discharge: 2018-11-19 | Disposition: A | Discharge status: Home / Self Care | Payer: Medicare Other | Attending: Emergency Medicine | Admitting: Emergency Medicine

## 2018-11-19 ENCOUNTER — Other Ambulatory Visit: Payer: Self-pay

## 2018-11-19 ENCOUNTER — Encounter (HOSPITAL_COMMUNITY): Payer: Self-pay | Admitting: Emergency Medicine

## 2018-11-19 ENCOUNTER — Emergency Department (HOSPITAL_COMMUNITY): Payer: Medicare Other

## 2018-11-19 DIAGNOSIS — Z79899 Other long term (current) drug therapy: Secondary | ICD-10-CM | POA: Insufficient documentation

## 2018-11-19 DIAGNOSIS — N200 Calculus of kidney: Secondary | ICD-10-CM | POA: Diagnosis not present

## 2018-11-19 DIAGNOSIS — R109 Unspecified abdominal pain: Secondary | ICD-10-CM | POA: Diagnosis present

## 2018-11-19 DIAGNOSIS — R350 Frequency of micturition: Secondary | ICD-10-CM | POA: Diagnosis not present

## 2018-11-19 DIAGNOSIS — N1339 Other hydronephrosis: Secondary | ICD-10-CM | POA: Insufficient documentation

## 2018-11-19 DIAGNOSIS — R5381 Other malaise: Secondary | ICD-10-CM | POA: Diagnosis not present

## 2018-11-19 DIAGNOSIS — R0902 Hypoxemia: Secondary | ICD-10-CM | POA: Diagnosis not present

## 2018-11-19 DIAGNOSIS — K7460 Unspecified cirrhosis of liver: Secondary | ICD-10-CM | POA: Diagnosis not present

## 2018-11-19 DIAGNOSIS — I5030 Unspecified diastolic (congestive) heart failure: Secondary | ICD-10-CM | POA: Insufficient documentation

## 2018-11-19 DIAGNOSIS — K8020 Calculus of gallbladder without cholecystitis without obstruction: Secondary | ICD-10-CM | POA: Diagnosis not present

## 2018-11-19 DIAGNOSIS — R52 Pain, unspecified: Secondary | ICD-10-CM | POA: Diagnosis not present

## 2018-11-19 DIAGNOSIS — N23 Unspecified renal colic: Secondary | ICD-10-CM | POA: Insufficient documentation

## 2018-11-19 DIAGNOSIS — I110 Hypertensive heart disease with heart failure: Secondary | ICD-10-CM | POA: Insufficient documentation

## 2018-11-19 DIAGNOSIS — Z87891 Personal history of nicotine dependence: Secondary | ICD-10-CM | POA: Insufficient documentation

## 2018-11-19 DIAGNOSIS — N132 Hydronephrosis with renal and ureteral calculous obstruction: Secondary | ICD-10-CM | POA: Diagnosis not present

## 2018-11-19 HISTORY — DX: Disorder of kidney and ureter, unspecified: N28.9

## 2018-11-19 LAB — CBC WITH DIFFERENTIAL/PLATELET
Abs Immature Granulocytes: 0.02 10*3/uL (ref 0.00–0.07)
Basophils Absolute: 0 10*3/uL (ref 0.0–0.1)
Basophils Relative: 1 %
Eosinophils Absolute: 0.1 10*3/uL (ref 0.0–0.5)
Eosinophils Relative: 1 %
HCT: 38.6 % (ref 36.0–46.0)
Hemoglobin: 12.8 g/dL (ref 12.0–15.0)
Immature Granulocytes: 0 %
Lymphocytes Relative: 20 %
Lymphs Abs: 1.4 10*3/uL (ref 0.7–4.0)
MCH: 32.2 pg (ref 26.0–34.0)
MCHC: 33.2 g/dL (ref 30.0–36.0)
MCV: 97 fL (ref 80.0–100.0)
Monocytes Absolute: 1 10*3/uL (ref 0.1–1.0)
Monocytes Relative: 14 %
Neutro Abs: 4.3 10*3/uL (ref 1.7–7.7)
Neutrophils Relative %: 64 %
Platelets: 85 10*3/uL — ABNORMAL LOW (ref 150–400)
RBC: 3.98 MIL/uL (ref 3.87–5.11)
RDW: 13.8 % (ref 11.5–15.5)
WBC: 6.8 10*3/uL (ref 4.0–10.5)
nRBC: 0 % (ref 0.0–0.2)

## 2018-11-19 LAB — BASIC METABOLIC PANEL
Anion gap: 11 (ref 5–15)
BUN: 12 mg/dL (ref 8–23)
CO2: 23 mmol/L (ref 22–32)
Calcium: 8.8 mg/dL — ABNORMAL LOW (ref 8.9–10.3)
Chloride: 106 mmol/L (ref 98–111)
Creatinine, Ser: 1.02 mg/dL — ABNORMAL HIGH (ref 0.44–1.00)
GFR calc Af Amer: >60 mL/min (ref >60)
GFR calc non Af Amer: 57 mL/min — ABNORMAL LOW (ref >60)
Glucose, Bld: 108 mg/dL — ABNORMAL HIGH (ref 70–99)
Potassium: 3.4 mmol/L — ABNORMAL LOW (ref 3.5–5.1)
Sodium: 140 mmol/L (ref 135–145)

## 2018-11-19 LAB — URINALYSIS, ROUTINE W REFLEX MICROSCOPIC
Bacteria, UA: NONE SEEN
Bilirubin Urine: NEGATIVE
Glucose, UA: NEGATIVE mg/dL
Ketones, ur: NEGATIVE mg/dL
Nitrite: NEGATIVE
Protein, ur: 100 mg/dL — AB
RBC / HPF: >50 RBC/hpf — ABNORMAL HIGH (ref 0–5)
Specific Gravity, Urine: 1.015 (ref 1.005–1.030)
WBC, UA: >50 WBC/hpf — ABNORMAL HIGH (ref 0–5)
pH: 6 (ref 5.0–8.0)

## 2018-11-19 IMAGING — CT CT RENAL STONE PROTOCOL
2 of 4 series · 16 of 46 positions shown, 18 images · non-contrast
Comparison: [DATE]

CLINICAL DATA: Right flank pain. History of recent ureteral stone
and stenting.

EXAM:
CT ABDOMEN AND PELVIS WITHOUT CONTRAST
TECHNIQUE: Multidetector CT imaging of the abdomen and pelvis was performed
following the standard protocol without IV contrast.

[Series 2: axial st · axial · 0.98mm/px · z∈[+813,+1198]mm · 13 of 89 slices shown, 15 images]
[im 6/89  soft-tissue]
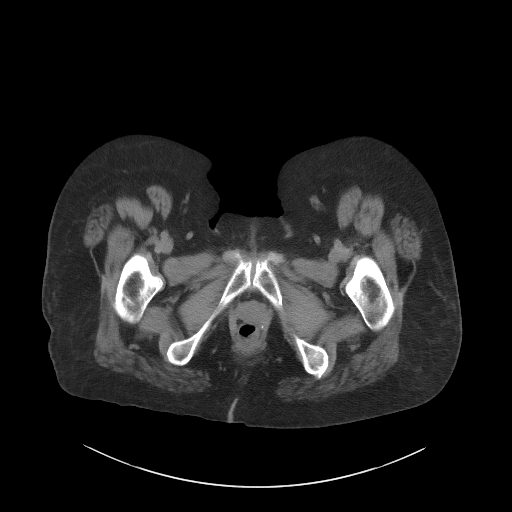
[im 6/89  bone]
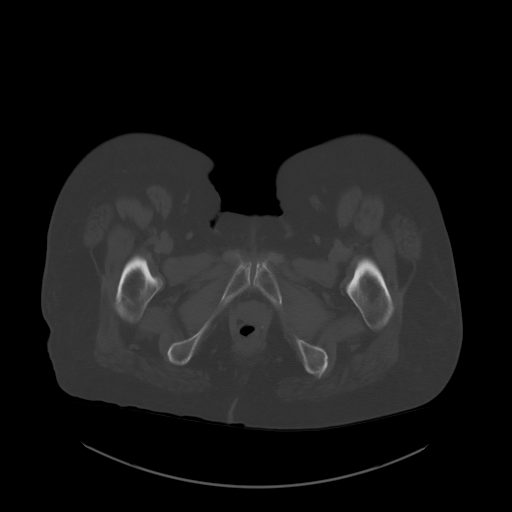
[im 12/89  soft-tissue]
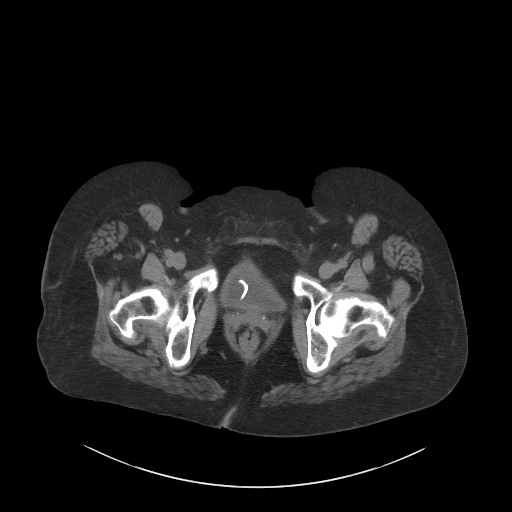
[im 17/89  soft-tissue]
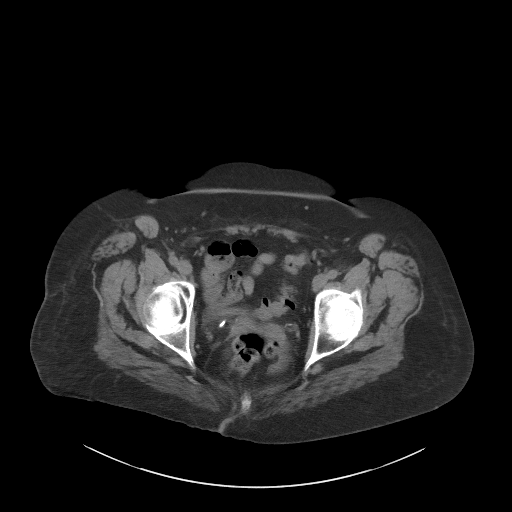
[im 28/89  soft-tissue]
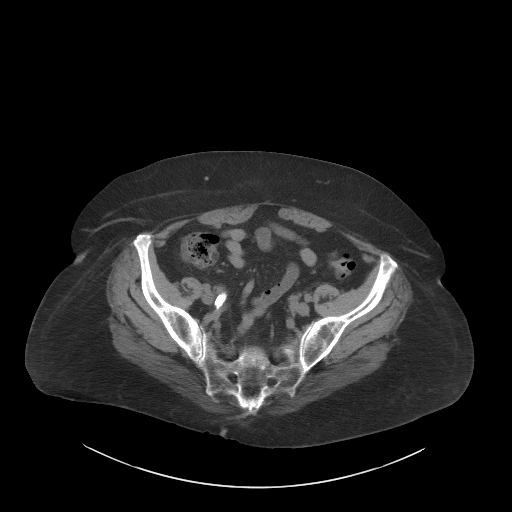
[im 34/89  soft-tissue]
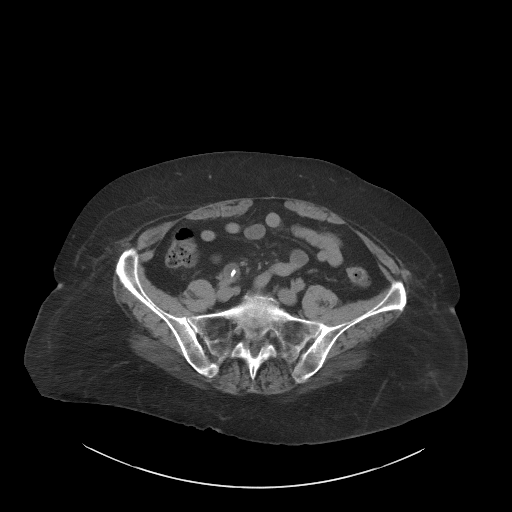
[im 39/89  soft-tissue]
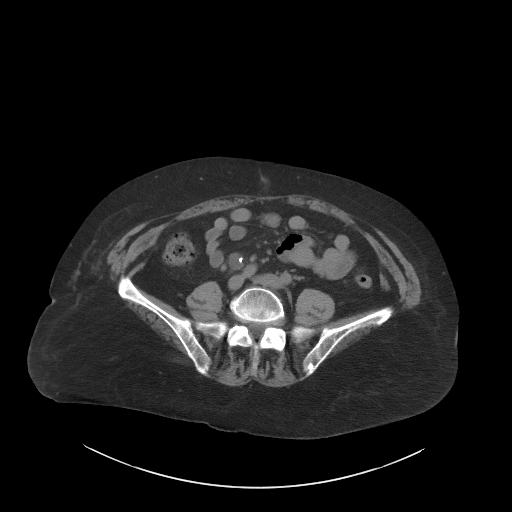
[im 45/89  soft-tissue]
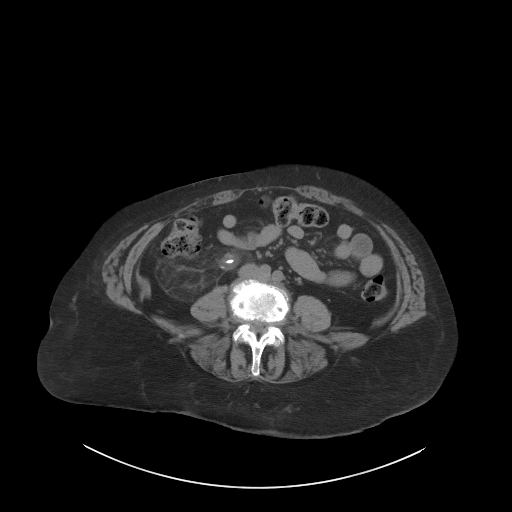
[im 50/89  soft-tissue]
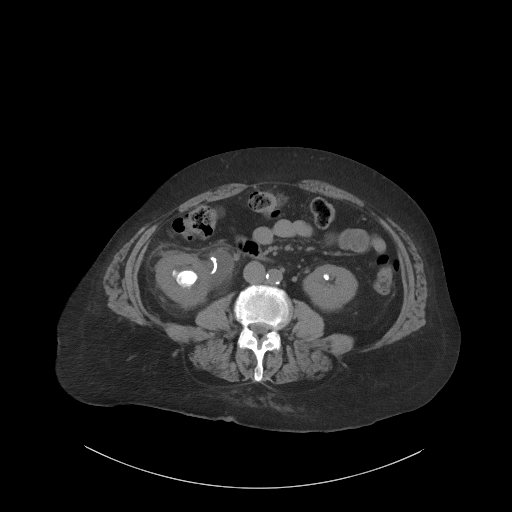
[im 56/89  soft-tissue]
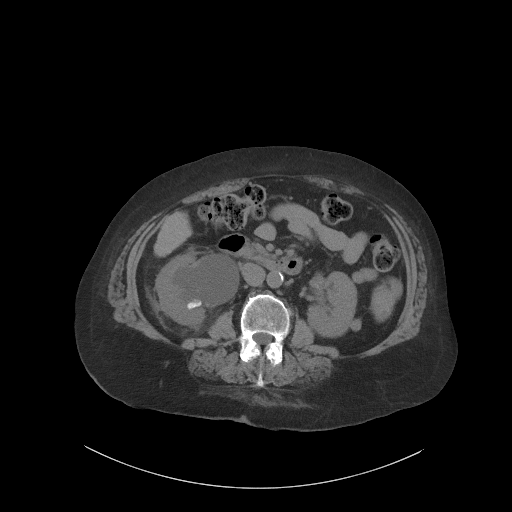
[im 56/89  bone]
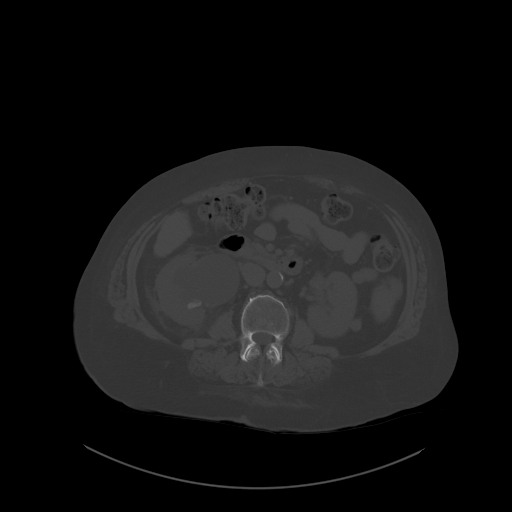
[im 61/89  soft-tissue]
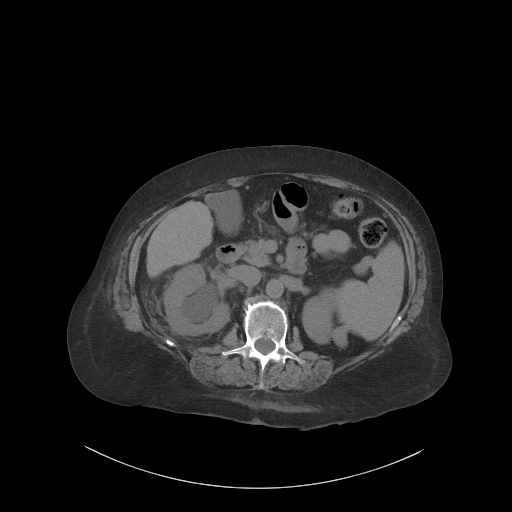
[im 72/89  soft-tissue]
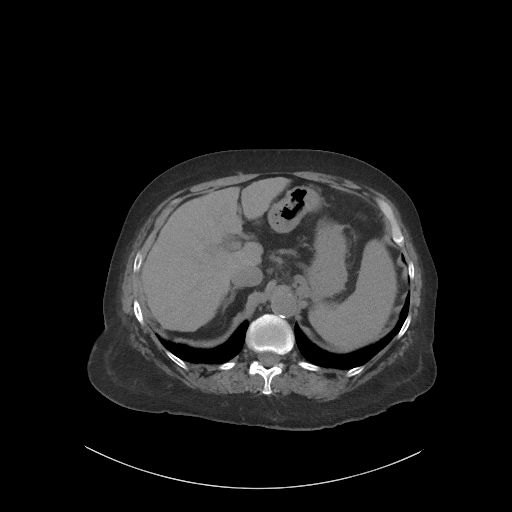
[im 78/89  soft-tissue]
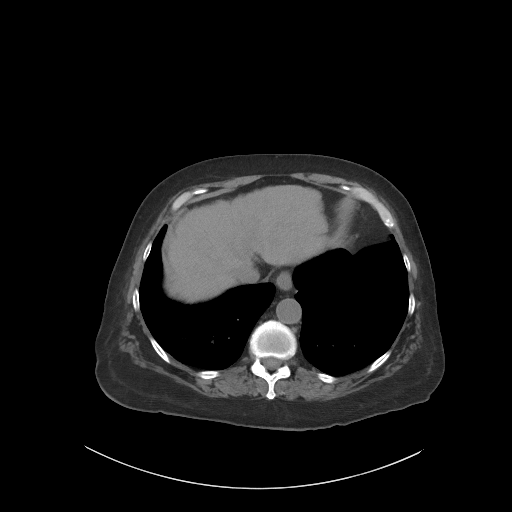
[im 83/89  soft-tissue]
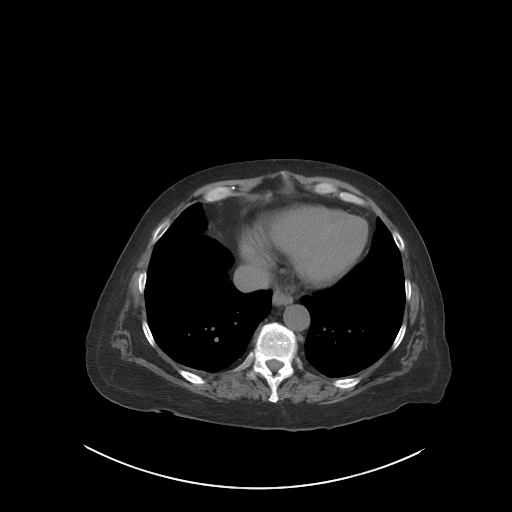

[Series 5: coronal st · coronal · 0.74mm/px · 3 of 85 slices shown]
[im 29/85  soft-tissue]
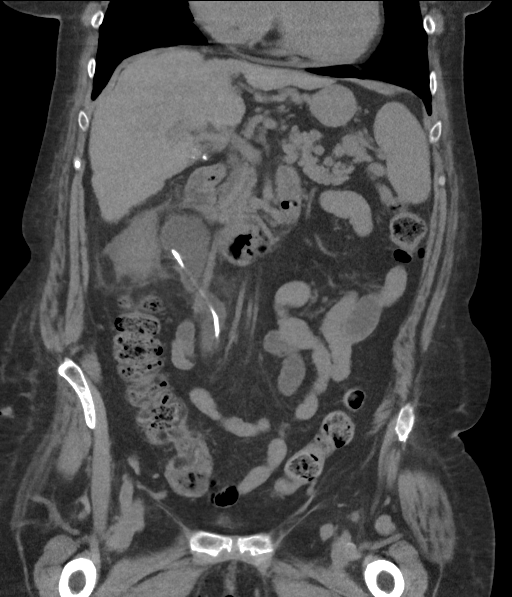
[im 38/85  soft-tissue]
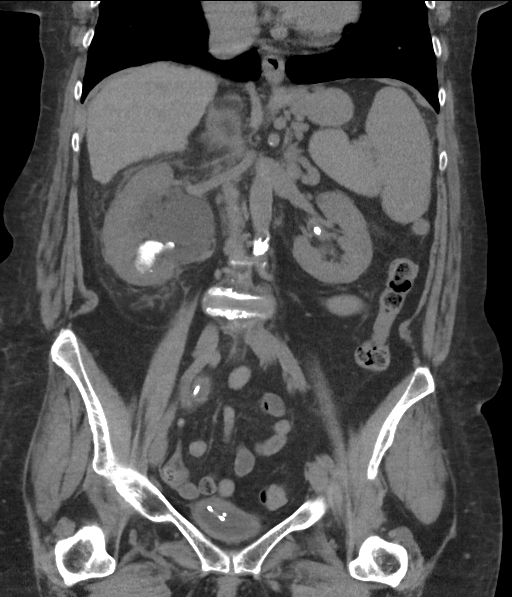
[im 47/85  soft-tissue]
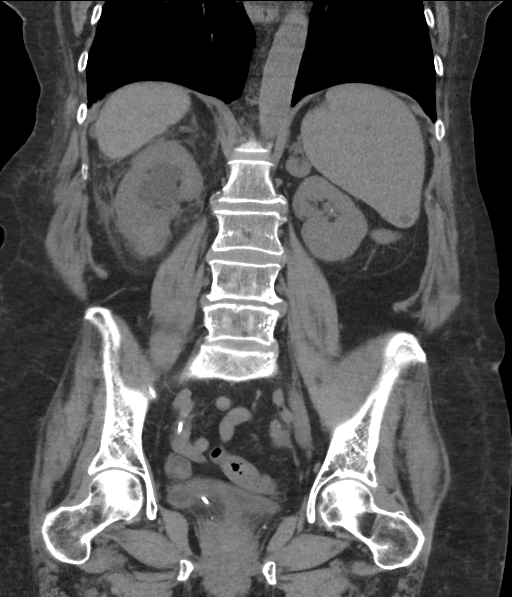

[16 of 46 positions shown; findings below may reference images not displayed]

FINDINGS: Lower chest:  No contributory findings.

Hepatobiliary: Cirrhotic liver with lobulated surface. No gross mass
lesion.Calcified gallstones. Gallbladder is full but there is no
evidence of inflammation. No common bile duct dilatation.

Pancreas: Unremarkable.

Spleen: Large spleen in the setting of portal hypertension with left
upper quadrant varices.

Adrenals/Urinary Tract: Negative adrenals. Marked right
hydronephrosis with renal expansion and perinephric edema despite a
well-positioned internal ureteral stent. Hydroureter continues to
the level of the renal pelvis where there is a 17 x 6 mm stone along
the stent. Smaller stone fragments are seen in the upper right
ureter. There is extensive right lower pole nephrolithiasis
including a 35 mm calculus. 4 left renal calculi measuring up to 1
cm at the renal pelvis. Unremarkable bladder.

Stomach/Bowel:  No obstruction. No appendicitis.

Vascular/Lymphatic: No acute vascular abnormality. No mass or
adenopathy.

Reproductive:Hysterectomy.

Other: No ascites or pneumoperitoneum.

Musculoskeletal: No acute abnormalities. Lumbar disc degeneration
with bulging and L4-5 spinal stenosis.
IMPRESSION: 1. Marked right hydronephrosis despite a ureteral stent. The level
of obstruction is at the level of the pelvic inlet where there is a
17 x 6 mm stone adjacent to the stent. There are smaller proximal
right ureteral calculi.
2. Bilateral nephrolithiasis, worse on the right.
3. Cirrhosis with portal hypertension.
4. Cholelithiasis.

## 2018-11-19 MED ORDER — SODIUM CHLORIDE 0.9 % IV BOLUS
1000.0000 mL | Freq: Once | INTRAVENOUS | Status: AC
  Administered 2018-11-19: 1000 mL via INTRAVENOUS

## 2018-11-19 MED ORDER — ONDANSETRON HCL 4 MG PO TABS: 4.0000 mg | ORAL_TABLET | Freq: Four times a day (QID) | ORAL | 0 refills | Status: DC | PRN

## 2018-11-19 MED ORDER — OXYCODONE HCL 5 MG PO TABS: 5.0000 mg | ORAL_TABLET | ORAL | 0 refills | Status: DC | PRN

## 2018-11-19 MED ORDER — HYDROMORPHONE HCL 1 MG/ML IJ SOLN
0.5000 mg | Freq: Once | INTRAMUSCULAR | Status: AC
  Administered 2018-11-19: 0.5 mg via INTRAVENOUS
  Filled 2018-11-19: qty 1

## 2018-11-19 MED ORDER — ONDANSETRON HCL 4 MG/2ML IJ SOLN
4.0000 mg | Freq: Once | INTRAMUSCULAR | Status: AC
  Administered 2018-11-19: 4 mg via INTRAVENOUS
  Filled 2018-11-19: qty 2

## 2018-11-19 NOTE — ED Triage Notes (Signed)
Pt c/o left flank pain that started yesterday.

## 2018-11-19 NOTE — Discharge Instructions (Signed)
Make sure you keep your appointment with the urologist.  Return if pain is not being adequately controlled, or if you start running a fever.

## 2018-11-19 NOTE — ED Provider Notes (Signed)
[facility] EMERGENCY DEPARTMENT Provider Note   CSN: [id number] Arrival date & time: 11/19/18  0443    History   Chief Complaint Chief Complaint  Patient presents with   Flank Pain    HPI Alyssa Pena is a 68 y.o. female.   The history is provided by the patient.  She has history of hypertension, thrombocytopenia, cirrhosis of the liver, kidney stones and comes in with severe right flank pain radiating to the right lower quadrant.  She was hospitalized 1 month ago for a staghorn calculus and had ureteral stent placed.  Initially, pain was manageable with the stent in place, but started to get worse about 1 week ago.  Pain is now severe and she rates it a 10/10.  There is associated nausea but no vomiting.  She denies fever or chills.  She has been taking tramadol for pain without relief.  Past Medical History:  Diagnosis Date   Alcohol dependence ([place]) 10/23/3265   Alcoholic hepatitis 07/11/5807   Cirrhosis ([place]) 08/10/2013   Fluid retention    Hypertension    Portal venous hypertension ([place]) 08/10/2013   Renal disorder    Thrombocytopenia ([place]) 08/10/2013    Patient Active Problem List   Diagnosis Date Noted   Ureteral calculus, right    Benign essential HTN    AKI (acute kidney injury) ([place]) 10/16/2018   Hepatitis C antibody test positive 98/33/8250   Diastolic CHF ([place]) 53/97/6734   Pulmonary hypertension ([place]) 08/12/2013   Hypokalemia 08/11/2013   IV drug user history 08/11/2013   Cirrhosis ([place]) 19/37/9024   Alcoholic hepatitis 09/73/5329   Portal venous hypertension ([place]) 08/10/2013   Thrombocytopenia ([place]) 08/10/2013   Alcohol dependence ([place]) 08/10/2013    Past Surgical History:  Procedure Laterality Date   ABDOMINAL HYSTERECTOMY     CATARACT EXTRACTION W/PHACO Left 08/30/2016   Procedure: CATARACT EXTRACTION PHACOEMULSIFICATION AND INTRAOCULAR LENS PLACEMENT LEFT EYE CDE - 46.93;  Surgeon: Baruch Goldmann, MD;  Location: [facility]S;   Service: Ophthalmology;  Laterality: Left;  left   CATARACT EXTRACTION W/PHACO Right 06/20/2017   Procedure: CATARACT EXTRACTION PHACO AND INTRAOCULAR LENS PLACEMENT RIGHT EYE;  Surgeon: Baruch Goldmann, MD;  Location: [facility];  Service: Ophthalmology;  Laterality: Right;  right   CYSTOSCOPY WITH RETROGRADE PYELOGRAM, URETEROSCOPY AND STENT PLACEMENT Right 10/17/2018   Procedure: CYSTOSCOPY WITH RETROGRADE PYELOGRAM, URETEROSCOPY AND STENT PLACEMENT;  Surgeon: Ardis Hughs, MD;  Location: [facility];  Service: Urology;  Laterality: Right;   KIDNEY STONE SURGERY       OB History   No obstetric history on file.      Home Medications    Prior to Admission medications   Medication Sig Start Date End Date Taking? Authorizing Provider  Biotin w/ Vitamins C & E (HAIR/SKIN/NAILS PO) Take 1 tablet by mouth daily.    [provider]  carvedilol (COREG) 6.25 MG tablet Take 6.25 mg by mouth 2 (two) times daily. 04/21/18   [provider]  FLUoxetine (PROZAC) 20 MG capsule Take 20 mg by mouth daily.    [provider]  guaiFENesin (ROBITUSSIN) 100 MG/5ML SOLN Take 5 mLs (100 mg total) by mouth every 6 (six) hours as needed for cough or to loosen phlegm. 10/18/18   Barton Dubois, MD  LORazepam (ATIVAN) 1 MG tablet Take 1 mg by mouth 2 (two) times daily.    [provider]    Family History Family History  Problem Relation Age of Onset   Lung cancer Father  Lung cancer Brother    COPD Brother    Dementia Mother    Arthritis Sister    High blood pressure Sister    COPD Sister     Social History Social History   Tobacco Use   Smoking status: Former Smoker   Smokeless tobacco: Never Used  Substance Use Topics   Alcohol use: Yes    Comment: daily   Drug use: No     Allergies   Penicillins   Review of Systems Review of Systems  All other systems reviewed and are negative.    Physical Exam Updated Vital Signs BP (!) 112/36     Pulse (!) 57    Temp 98.3 F (36.8 C) (Oral)    Resp 16    Ht 5\' 5"  (1.651 m)    Wt 90.7 kg    SpO2 99%    BMI 33.28 kg/m   Physical Exam Vitals signs and nursing note reviewed.    68 year old female, resting comfortably and in no acute distress. Vital signs are normal. Oxygen saturation is 99%, which is normal. Head is normocephalic and atraumatic. PERRLA, EOMI. Oropharynx is clear. Neck is nontender and supple without adenopathy or JVD. Back is nontender in the midline.  There is severe right CVA tenderness. Lungs are clear without rales, wheezes, or rhonchi. Chest is nontender. Heart has regular rate and rhythm without murmur. Abdomen is soft, flat, with moderate to severe right lower quadrant tenderness.  There is no rebound or guarding.  There are no masses or hepatosplenomegaly and peristalsis is hypoactive. Extremities have no cyanosis or edema, full range of motion is present. Skin is warm and dry without rash. Neurologic: Mental status is normal, cranial nerves are intact, there are no motor or sensory deficits.  ED Treatments / Results  Labs (all labs ordered are listed, but only abnormal results are displayed) Labs Reviewed  URINALYSIS, ROUTINE W REFLEX MICROSCOPIC - Abnormal; Notable for the following components:      Result Value   Color, Urine AMBER (*)    APPearance CLOUDY (*)    Hgb urine dipstick LARGE (*)    Protein, ur 100 (*)    Leukocytes,Ua MODERATE (*)    RBC / HPF >50 (*)    WBC, UA >50 (*)    All other components within normal limits  BASIC METABOLIC PANEL - Abnormal; Notable for the following components:   Potassium 3.4 (*)    Glucose, Bld 108 (*)    Creatinine, Ser 1.02 (*)    Calcium 8.8 (*)    GFR calc non Af Amer 57 (*)    All other components within normal limits  CBC WITH DIFFERENTIAL/PLATELET - Abnormal; Notable for the following components:   Platelets 85 (*)    All other components within normal limits   Radiology Ct Renal Stone  Study  Result Date: 11/19/2018 CLINICAL DATA:  Right flank pain. History of recent ureteral stone and stenting. EXAM: CT ABDOMEN AND PELVIS WITHOUT CONTRAST TECHNIQUE: Multidetector CT imaging of the abdomen and pelvis was performed following the standard protocol without IV contrast. COMPARISON:  10/15/2018 FINDINGS: Lower chest:  No contributory findings. Hepatobiliary: Cirrhotic liver with lobulated surface. No gross mass lesion.Calcified gallstones. Gallbladder is full but there is no evidence of inflammation. No common bile duct dilatation. Pancreas: Unremarkable. Spleen: Large spleen in the setting of portal hypertension with left upper quadrant varices. Adrenals/Urinary Tract: Negative adrenals. Marked right hydronephrosis with renal expansion and perinephric edema despite a well-positioned  internal ureteral stent. Hydroureter continues to the level of the renal pelvis where there is a 17 x 6 mm stone along the stent. Smaller stone fragments are seen in the upper right ureter. There is extensive right lower pole nephrolithiasis including a 35 mm calculus. 4 left renal calculi measuring up to 1 cm at the renal pelvis. Unremarkable bladder. Stomach/Bowel:  No obstruction. No appendicitis. Vascular/Lymphatic: No acute vascular abnormality. No mass or adenopathy. Reproductive:Hysterectomy. Other: No ascites or pneumoperitoneum. Musculoskeletal: No acute abnormalities. Lumbar disc degeneration with bulging and L4-5 spinal stenosis. IMPRESSION: 1. Marked right hydronephrosis despite a ureteral stent. The level of obstruction is at the level of the pelvic inlet where there is a 17 x 6 mm stone adjacent to the stent. There are smaller proximal right ureteral calculi. 2. Bilateral nephrolithiasis, worse on the right. 3. Cirrhosis with portal hypertension. 4. Cholelithiasis. Electronically Signed   By: Monte Fantasia M.D.   On: 11/19/2018 06:26    Procedures Procedures  Medications Ordered in ED Medications   HYDROmorphone (DILAUDID) injection 0.5 mg (0.5 mg Intravenous Given 11/19/18 0516)  ondansetron (ZOFRAN) injection 4 mg (4 mg Intravenous Given 11/19/18 0515)  sodium chloride 0.9 % bolus 1,000 mL (1,000 mLs Intravenous New Bag/Given 11/19/18 0515)     Initial Impression / Assessment and Plan / ED Course  I have reviewed the triage vital signs and the nursing notes.  Pertinent labs & imaging results that were available during my care of the patient were reviewed by me and considered in my medical decision making (see chart for details).  Right flank pain in patient with none ureteral calculus and ureteral stent placement.  Old records reviewed confirming hospitalization 1 month ago with placement of right ureteral stent.  At that time, she had an acute kidney injury which resolved following stent placement.  Will recheck renal function and sent for CT scan to evaluate hydronephrosis.  CT scan shows hydronephrosis which looks very similar to prior CT scan.  There is also a calculus in the distal right ureter which appears to have been present on a prior CT scan but has moved proximally, and may be compressing the stent.  Labs show normal renal function.  Urine has numerous RBCs and WBCs, but negative nitrite.  Case was discussed with Dr. Lovena Neighbours, on-call for urology who states that as long as pain can be controlled and she is not febrile, she can be followed up in the urology office.  She has an appointment with Dr. Louis Meckel this afternoon, and patient is encouraged to keep that appointment.  She is discharged with prescriptions for oxycodone and ondansetron.  Final Clinical Impressions(s) / ED Diagnoses   Final diagnoses:  Ureteral colic  Other hydronephrosis    ED Discharge Orders         Ordered    oxyCODONE (ROXICODONE) 5 MG immediate release tablet  Every 4 hours PRN     11/19/18 0725    ondansetron (ZOFRAN) 4 MG tablet  Every 6 hours PRN     11/19/18 2956           Delora Fuel,  MD 21/30/86 [phone]

## 2018-11-23 ENCOUNTER — Other Ambulatory Visit (HOSPITAL_COMMUNITY): Payer: Self-pay | Admitting: Urology

## 2018-11-23 ENCOUNTER — Other Ambulatory Visit: Payer: Self-pay | Admitting: Urology

## 2018-11-23 DIAGNOSIS — N200 Calculus of kidney: Secondary | ICD-10-CM

## 2018-12-21 ENCOUNTER — Other Ambulatory Visit (HOSPITAL_COMMUNITY)
Admission: RE | Admit: 2018-12-21 | Discharge: 2018-12-21 | Disposition: A | Discharge status: Home / Self Care | Payer: Medicare Other | Source: Ambulatory Visit | Attending: Urology | Admitting: Urology

## 2018-12-21 DIAGNOSIS — Z01812 Encounter for preprocedural laboratory examination: Secondary | ICD-10-CM | POA: Insufficient documentation

## 2018-12-21 DIAGNOSIS — Z1159 Encounter for screening for other viral diseases: Secondary | ICD-10-CM | POA: Insufficient documentation

## 2018-12-22 ENCOUNTER — Other Ambulatory Visit: Payer: Self-pay | Admitting: Radiology

## 2018-12-22 LAB — SARS CORONAVIRUS 2 (TAT 6-24 HRS): SARS Coronavirus 2: NEGATIVE

## 2018-12-22 NOTE — Patient Instructions (Addendum)
YOU HAD   A COVID 19 TEST ON 12-21-2018,  ONCE YOUR COVID TEST IS COMPLETED, PLEASE BEGIN THE QUARANTINE INSTRUCTIONS AS OUTLINED IN YOUR HANDOUT.                Alyssa Pena    Your procedure is scheduled on: 12-24-2018   Report to [facility] Main  Entrance             Report to RADIOLOGY at 800 AM      Call this number if you have problems the morning of surgery [phone]    Remember: Do not eat food or drink liquids :After Midnight. BRUSH YOUR TEETH MORNING OF SURGERY AND RINSE YOUR MOUTH OUT, NO CHEWING GUM CANDY OR MINTS.     Take these medicines the morning of surgery with A SIP OF WATER: prozac, carvedilol                                You may not have any metal on your body including hair pins and              piercings  Do not wear jewelry, make-up, lotions, powders or perfumes, deodorant             Do not wear nail polish.  Do not shave  48 hours prior to surgery.               Do not bring valuables to the hospital. [place].  Contacts, dentures or bridgework may not be worn into surgery.   _____________________________________________________________________          [facility] - Preparing for Surgery Before surgery, you can play an important role.  Because skin is not sterile, your skin needs to be as free of germs as possible.  You can reduce the number of germs on your skin by washing with CHG (chlorahexidine gluconate) soap before surgery.  CHG is an antiseptic cleaner which kills germs and bonds with the skin to continue killing germs even after washing. Please DO NOT use if you have an allergy to CHG or antibacterial soaps.  If your skin becomes reddened/irritated stop using the CHG and inform your nurse when you arrive at Short Stay. Do not shave (including legs and underarms) for at least 48 hours prior to the first CHG shower.  You may shave your face/neck. Please follow these instructions  carefully:  1.  Shower with CHG Soap the night before surgery and the  morning of Surgery.  2.  If you choose to wash your hair, wash your hair first as usual with your  normal  shampoo.  3.  After you shampoo, rinse your hair and body thoroughly to remove the  shampoo.                           4.  Use CHG as you would any other liquid soap.  You can apply chg directly  to the skin and wash                       Gently with a scrungie or clean washcloth.  5.  Apply the CHG Soap to your body ONLY FROM THE NECK DOWN.   Do not use on face/  open                           Wound or open sores. Avoid contact with eyes, ears mouth and genitals (private parts).                       Wash face,  Genitals (private parts) with your normal soap.             6.  Wash thoroughly, paying special attention to the area where your surgery  will be performed.  7.  Thoroughly rinse your body with warm water from the neck down.  8.  DO NOT shower/wash with your normal soap after using and rinsing off  the CHG Soap.                9.  Pat yourself dry with a clean towel.            10.  Wear clean pajamas.            11.  Place clean sheets on your bed the night of your first shower and do not  sleep with pets. Day of Surgery : Do not apply any lotions/deodorants the morning of surgery.  Please wear clean clothes to the hospital/surgery center.  FAILURE TO FOLLOW THESE INSTRUCTIONS MAY RESULT IN THE CANCELLATION OF YOUR SURGERY PATIENT SIGNATURE_________________________________  NURSE SIGNATURE__________________________________  ________________________________________________________________________  WHAT IS A BLOOD TRANSFUSION? Blood Transfusion Information  A transfusion is the replacement of blood or some of its parts. Blood is made up of multiple cells which provide different functions.  Red blood cells carry oxygen and are used for blood loss replacement.  White blood cells fight against  infection.  Platelets control bleeding.  Plasma helps clot blood.  Other blood products are available for specialized needs, such as hemophilia or other clotting disorders. BEFORE THE TRANSFUSION  Who gives blood for transfusions?   Healthy volunteers who are fully evaluated to make sure their blood is safe. This is blood bank blood. Transfusion therapy is the safest it has ever been in the practice of medicine. Before blood is taken from a donor, a complete history is taken to make sure that person has no history of diseases nor engages in risky social behavior (examples are intravenous drug use or sexual activity with multiple partners). The donor's travel history is screened to minimize risk of transmitting infections, such as malaria. The donated blood is tested for signs of infectious diseases, such as HIV and hepatitis. The blood is then tested to be sure it is compatible with you in order to minimize the chance of a transfusion reaction. If you or a relative donates blood, this is often done in anticipation of surgery and is not appropriate for emergency situations. It takes many days to process the donated blood. RISKS AND COMPLICATIONS Although transfusion therapy is very safe and saves many lives, the main dangers of transfusion include:   Getting an infectious disease.  Developing a transfusion reaction. This is an allergic reaction to something in the blood you were given. Every precaution is taken to prevent this. The decision to have a blood transfusion has been considered carefully by your caregiver before blood is given. Blood is not given unless the benefits outweigh the risks. AFTER THE TRANSFUSION  Right after receiving a blood transfusion, you will usually feel much better and more energetic. This is especially true if your red blood  cells have gotten low (anemic). The transfusion raises the level of the red blood cells which carry oxygen, and this usually causes an energy  increase.  The nurse administering the transfusion will monitor you carefully for complications. HOME CARE INSTRUCTIONS  No special instructions are needed after a transfusion. You may find your energy is better. Speak with your caregiver about any limitations on activity for underlying diseases you may have. SEEK MEDICAL CARE IF:   Your condition is not improving after your transfusion.  You develop redness or irritation at the intravenous (IV) site. SEEK IMMEDIATE MEDICAL CARE IF:  Any of the following symptoms occur over the next 12 hours:  Shaking chills.  You have a temperature by mouth above 102 F (38.9 C), not controlled by medicine.  Chest, back, or muscle pain.  People around you feel you are not acting correctly or are confused.  Shortness of breath or difficulty breathing.  Dizziness and fainting.  You get a rash or develop hives.  You have a decrease in urine output.  Your urine turns a dark color or changes to pink, red, or brown. Any of the following symptoms occur over the next 10 days:  You have a temperature by mouth above 102 F (38.9 C), not controlled by medicine.  Shortness of breath.  Weakness after normal activity.  The white part of the eye turns yellow (jaundice).  You have a decrease in the amount of urine or are urinating less often.  Your urine turns a dark color or changes to pink, red, or brown. Document Released: 05/31/2000 Document Revised: 08/26/2011 Document Reviewed: 01/18/2008 [facility] Patient Information 2014 [place], [place].  _______________________________________________________________________

## 2018-12-23 ENCOUNTER — Other Ambulatory Visit: Payer: Self-pay

## 2018-12-23 ENCOUNTER — Inpatient Hospital Stay (HOSPITAL_COMMUNITY)
Admission: RE | Admit: 2018-12-23 | Discharge: 2018-12-23 | Disposition: A | Discharge status: Home / Self Care | Payer: Medicare Other | Source: Ambulatory Visit

## 2018-12-23 ENCOUNTER — Encounter (HOSPITAL_COMMUNITY): Payer: Self-pay

## 2018-12-23 ENCOUNTER — Other Ambulatory Visit: Payer: Self-pay | Admitting: Student

## 2018-12-23 NOTE — H&P (Signed)
Right renal and ureteral calculi   Alyssa Pena is a 68 year old female who I had seen as a hospital consultation at [facility] on 10/16/2018 for large burden right renal and multiple right ureteral calculi. She was also incidentally noted to have a nonobstructing left renal calculus. Due to significant pain requiring IV pain medication in the significant burden of stone, she underwent ureteral stent placement on 10/17/2018 performed by Dr. Louis Meckel. She was subsequently discharged from the hospital. She chose to delay her follow-up due to concerns regarding the COVID-19 situation. She presents today and states that overall, she has been doing well aside from right-sided flank pain and lower urinary tract symptoms consistent with her indwelling stent.   Her stone burden includes a partial right staghorn calculus measuring almost 4 cm in largest diameter. She also has additional renal pelvic and renal calculi. In addition, she has 2 large ureteral calculi including a 13 mm proximal right ureteral stone and an 11 mm distal right ureteral stone at the UVJ. She does have a prior history of stones treated in 1995 but denies any other problems with kidney stones over the past 25 years until recently. She has denied any recent fevers. She does not have any evidence of a retro renal colon.   Her past medical history is significant for chronic liver disease related to past alcohol abuse. She no longer drinks alcohol. She denies tobacco use or illicit drug use. She also has a history of hepatitis C. She has no history of heart disease and has not had any recent chest pain, shortness of breath, or palpitations. She does have a history of hyperlipidemia and hypertension and arthritis. Her current medications include fluoxetine for depression, lorazepam for anxiety, and carvedilol for hypertension. Her baseline renal function is normal with a serum creatinine of 0.9.     ALLERGIES: Penicillin - Hives     MEDICATIONS: Biotin  Coreg  Lorazepam  Prozac  Vitamin C  Vitamin E     GU PSH: Hysterectomy    NON-GU PSH: Cataract surgery, Bilateral    GU PMH: Kidney Failure, acute, Unspec Ureteral calculus      PMH Notes:   1) Urolithiasis: She presented as a consult at [facility] in May 2020 with very large burden left renal and ureteral calculi.     ** She has a history of chronic liver disease due to alcohol abuse in the past.    NON-GU PMH: Alcohol dependence, uncomplicated Alcoholic hepatitis without ascites Hypertension Hypokalemia Other specified abnormal immunological findings in serum Portal hypertension Pulmonary hypertension, unspecified Thrombocytopenia, unspecified Unspecified cirrhosis of liver Unspecified diastolic (congestive) heart failure    FAMILY HISTORY: Arthritis - Sister COPD (chronic obstructive pulmonary disease) with - Sister, Brother Dementia - Mother Hypertension - Sister Lung Cancer - Father, Brother   SOCIAL HISTORY: Marital Status: Widowed Preferred Language: English; Ethnicity: Not Hispanic Or Latino; Race: White Current Smoking Status: Patient does not smoke anymore. Has not smoked since 10/15/2017. Smoked 2 packs per day.   Tobacco Use Assessment Completed: Used Tobacco in last 30 days? Does not use smokeless tobacco. Does not drink anymore.  Drinks 1 caffeinated drink per day.    REVIEW OF SYSTEMS:    GU Review Female:   Patient reports frequent urination, hard to postpone urination, burning /pain with urination, get up at night to urinate, and stream starts and stops. Patient denies leakage of urine, trouble starting your stream, have to strain to urinate, and currently pregnant.  Gastrointestinal (Upper):   Patient denies nausea and vomiting.  Gastrointestinal (Lower):   Patient denies diarrhea and constipation.  Constitutional:   Patient denies fever, night sweats, weight loss, and fatigue.  Skin:   Patient denies skin  rash/ lesion and itching.  Eyes:   Patient denies blurred vision and double vision.  Ears/ Nose/ Throat:   Patient denies sore throat and sinus problems.  Hematologic/Lymphatic:   Patient reports easy bruising. Patient denies swollen glands.  Cardiovascular:   Patient reports leg swelling. Patient denies chest pains.  Respiratory:   Patient denies cough and shortness of breath.  Endocrine:   Patient denies excessive thirst.  Musculoskeletal:   Patient reports back pain and joint pain.   Neurological:   Patient denies headaches and dizziness.  Psychologic:   Patient reports depression and anxiety.    VITAL SIGNS:     Weight 175 lb / 79.38 kg  Height 65 in / 165.1 cm   Temperature 97.9 F / 36.6 C  BMI 29.1 kg/m   MULTI-SYSTEM PHYSICAL EXAMINATION:    Constitutional: Well-nourished. No physical deformities. Normally developed. Good grooming.  Neck: Neck symmetrical, not swollen. Normal tracheal position.  Respiratory: No labored breathing, no use of accessory muscles. Clear bilaterally.  Cardiovascular: Normal temperature, normal extremity pulses, no swelling, no varicosities. Regular rate and rhythm.  Lymphatic: No enlargement of neck, axillae, groin.  Skin: No paleness, no jaundice, no cyanosis. No lesion, no ulcer, no rash.  Neurologic / Psychiatric: Oriented to time, oriented to place, oriented to person. No depression, no anxiety, no agitation.  Gastrointestinal: No mass, no tenderness, no rigidity, non obese abdomen. Mild to moderate right CVA tenderness.  Eyes: Normal conjunctivae. Normal eyelids.  Ears, Nose, Mouth, and Throat: Left ear no scars, no lesions, no masses. Right ear no scars, no lesions, no masses. Nose no scars, no lesions, no masses. Normal hearing. Normal lips.  Musculoskeletal: Normal gait and station of head and neck.     PAST DATA REVIEWED:  Source Of History:  Patient  Records Review:   Previous Patient Records  Urine Test Review:   Urinalysis  X-Ray  Review: C.T. Abdomen/Pelvis: Reviewed Films.    Notes:                     CLINICAL DATA: 68 year old female with right flank pain.   EXAM:  CT ABDOMEN AND PELVIS WITHOUT CONTRAST   TECHNIQUE:  Multidetector CT imaging of the abdomen and pelvis was performed  following the standard protocol without IV contrast.   COMPARISON: CT Abdomen and Pelvis 08/10/2013.   FINDINGS:  Lower chest: Negative. No pericardial or pleural effusion.   Hepatobiliary: Nodular, cirrhotic liver. No discrete liver lesion.   Calcified gallstones individually up to 12 millimeters. No  pericholecystic inflammation. Bile duct size appears stable.   Pancreas: Negative.   Spleen: Chronic splenomegaly and prominent perisplenic varices.  Gastrosplenic and splenorenal collaterals.   Adrenals/Urinary Tract: Left adrenal gland obscured by chronic  varices. Bulky left nephrolithiasis with individual calculi up to 8  millimeters but no left hydronephrosis. Decompressed left ureter.   Severe right hydronephrosis and perinephric stranding. Likely  forniceal rupture. Nephromegaly. Staghorn intrarenal calculi  individually up to 3 centimeters. Just distal to the ureteropelvic  junction there is a 17 millimeter staghorn calculus (series 2, image  38). But severe right hydroureter (20 millimeters diameter)  continues to the pelvis where there are clustered calculi in the  distal ureter which individually might measure up to  9 millimeters  (series 2, image 67), and a bulky 14 millimeter calculus lodged at  the ureterovesical junction (series 2, image 72).   Decompressed urinary bladder. Superimposed bilateral pelvic  phleboliths.   Stomach/Bowel: Decompressed rectosigmoid colon. Redundant sigmoid  with mild diverticulosis. No active inflammation. Negative  transverse and descending colon. Negative right colon and appendix.  Negative terminal ileum. No dilated small bowel.   Fluid-filled distal esophagus, but the  stomach is largely  decompressed. No free air.   Vascular/Lymphatic: Vascular patency is not evaluated in the absence  of IV contrast. Aortoiliac calcified atherosclerosis.   Reproductive: Surgically absent uterus. Diminutive or absent  ovaries.   Other: Trace if any pelvic free fluid.   Musculoskeletal: Osteopenia. No acute osseous abnormality  identified.   IMPRESSION:  1. Severe Acute Obstructive Uropathy on the right with Forniceal  Rupture.  Bulky, staghorn-like calculi up to 17 mm are lodged at both the  right UPJ and UVJ.  Additional clustered stones in the dilated distal ureter  (steinstrasse).  Additional up to 3 centimeter staghorn calculi within the right  kidney.  2. Lesser left nephrolithiasis.  3. Chronic cirrhosis and stigmata of portal venous hypertension  including splenomegaly and perisplenic varices.  4. Cholelithiasis.    Electronically Signed  By: Genevie Ann M.D.  On: 10/15/2018 23:00      ASSESSMENT:      ICD-10 Details  1 GU:   Ureteral calculus - N20.1   2   Renal calculus - N20.0    PLAN:      1. Large burden right renal and right ureteral calculi: I had a detailed discussion with Alyssa Pena today. We reviewed her imaging and I explained to her the very large burden of stone that she has in her kidney and ureter. Considering the large burden of stone disease, I have recommended that she proceed with definitive therapy that may require a combination of treatments and in multiple stages. She understands that she would require percutaneous nephrostolithotomy for treatment of her large burden right renal calculi and that this might require multiple stages. Although we certainly can attempt to proceed with antegrade ureteroscopy, she also may require retrograde ureteroscopic therapy to clear her ureteral calculi. I anticipate that she will need at least 2 procedures and possibly more. We reviewed this surgery in detail today including the potential risks that  include but are not limited to bleeding, infection, cardiopulmonary risks, risk of damage to adjacent structures such as the colon, lung, kidney, etc, need for multiple procedures.   Considering her history of liver disease, she will have a CBC and coagulation studies checked today. Her renal function and liver function will also be checked today. We will coordinate right renal access with interventional radiology and she will likely benefit from axis to the right lower pole where her main stone burden is identified. This should allow easy access to her entire renal collecting system.   We will plan to schedule a right percutaneous nephrostolithotomy first stage procedure as well as a second-stage percutaneous nephrostolithotomy/ureteroscopy with possible laser lithotripsy.

## 2018-12-23 NOTE — H&P (View-Only) (Signed)
Right renal and ureteral calculi   Alyssa Pena is a 68 year old female who I had seen as a hospital consultation at [facility] on 10/16/2018 for large burden right renal and multiple right ureteral calculi. She was also incidentally noted to have a nonobstructing left renal calculus. Due to significant pain requiring IV pain medication in the significant burden of stone, she underwent ureteral stent placement on 10/17/2018 performed by Dr. Louis Meckel. She was subsequently discharged from the hospital. She chose to delay her follow-up due to concerns regarding the COVID-19 situation. She presents today and states that overall, she has been doing well aside from right-sided flank pain and lower urinary tract symptoms consistent with her indwelling stent.   Her stone burden includes a partial right staghorn calculus measuring almost 4 cm in largest diameter. She also has additional renal pelvic and renal calculi. In addition, she has 2 large ureteral calculi including a 13 mm proximal right ureteral stone and an 11 mm distal right ureteral stone at the UVJ. She does have a prior history of stones treated in 1995 but denies any other problems with kidney stones over the past 25 years until recently. She has denied any recent fevers. She does not have any evidence of a retro renal colon.   Her past medical history is significant for chronic liver disease related to past alcohol abuse. She no longer drinks alcohol. She denies tobacco use or illicit drug use. She also has a history of hepatitis C. She has no history of heart disease and has not had any recent chest pain, shortness of breath, or palpitations. She does have a history of hyperlipidemia and hypertension and arthritis. Her current medications include fluoxetine for depression, lorazepam for anxiety, and carvedilol for hypertension. Her baseline renal function is normal with a serum creatinine of 0.9.     ALLERGIES: Penicillin - Hives     MEDICATIONS: Biotin  Coreg  Lorazepam  Prozac  Vitamin C  Vitamin E     GU PSH: Hysterectomy    NON-GU PSH: Cataract surgery, Bilateral    GU PMH: Kidney Failure, acute, Unspec Ureteral calculus      PMH Notes:   1) Urolithiasis: She presented as a consult at [facility] in May 2020 with very large burden left renal and ureteral calculi.     ** She has a history of chronic liver disease due to alcohol abuse in the past.    NON-GU PMH: Alcohol dependence, uncomplicated Alcoholic hepatitis without ascites Hypertension Hypokalemia Other specified abnormal immunological findings in serum Portal hypertension Pulmonary hypertension, unspecified Thrombocytopenia, unspecified Unspecified cirrhosis of liver Unspecified diastolic (congestive) heart failure    FAMILY HISTORY: Arthritis - Sister COPD (chronic obstructive pulmonary disease) with - Sister, Brother Dementia - Mother Hypertension - Sister Lung Cancer - Father, Brother   SOCIAL HISTORY: Marital Status: Widowed Preferred Language: English; Ethnicity: Not Hispanic Or Latino; Race: White Current Smoking Status: Patient does not smoke anymore. Has not smoked since 10/15/2017. Smoked 2 packs per day.   Tobacco Use Assessment Completed: Used Tobacco in last 30 days? Does not use smokeless tobacco. Does not drink anymore.  Drinks 1 caffeinated drink per day.    REVIEW OF SYSTEMS:    GU Review Female:   Patient reports frequent urination, hard to postpone urination, burning /pain with urination, get up at night to urinate, and stream starts and stops. Patient denies leakage of urine, trouble starting your stream, have to strain to urinate, and currently pregnant.  Gastrointestinal (Upper):   Patient denies nausea and vomiting.  Gastrointestinal (Lower):   Patient denies diarrhea and constipation.  Constitutional:   Patient denies fever, night sweats, weight loss, and fatigue.  Skin:   Patient denies skin  rash/ lesion and itching.  Eyes:   Patient denies blurred vision and double vision.  Ears/ Nose/ Throat:   Patient denies sore throat and sinus problems.  Hematologic/Lymphatic:   Patient reports easy bruising. Patient denies swollen glands.  Cardiovascular:   Patient reports leg swelling. Patient denies chest pains.  Respiratory:   Patient denies cough and shortness of breath.  Endocrine:   Patient denies excessive thirst.  Musculoskeletal:   Patient reports back pain and joint pain.   Neurological:   Patient denies headaches and dizziness.  Psychologic:   Patient reports depression and anxiety.    VITAL SIGNS:     Weight 175 lb / 79.38 kg  Height 65 in / 165.1 cm   Temperature 97.9 F / 36.6 C  BMI 29.1 kg/m   MULTI-SYSTEM PHYSICAL EXAMINATION:    Constitutional: Well-nourished. No physical deformities. Normally developed. Good grooming.  Neck: Neck symmetrical, not swollen. Normal tracheal position.  Respiratory: No labored breathing, no use of accessory muscles. Clear bilaterally.  Cardiovascular: Normal temperature, normal extremity pulses, no swelling, no varicosities. Regular rate and rhythm.  Lymphatic: No enlargement of neck, axillae, groin.  Skin: No paleness, no jaundice, no cyanosis. No lesion, no ulcer, no rash.  Neurologic / Psychiatric: Oriented to time, oriented to place, oriented to person. No depression, no anxiety, no agitation.  Gastrointestinal: No mass, no tenderness, no rigidity, non obese abdomen. Mild to moderate right CVA tenderness.  Eyes: Normal conjunctivae. Normal eyelids.  Ears, Nose, Mouth, and Throat: Left ear no scars, no lesions, no masses. Right ear no scars, no lesions, no masses. Nose no scars, no lesions, no masses. Normal hearing. Normal lips.  Musculoskeletal: Normal gait and station of head and neck.     PAST DATA REVIEWED:  Source Of History:  Patient  Records Review:   Previous Patient Records  Urine Test Review:   Urinalysis  X-Ray  Review: C.T. Abdomen/Pelvis: Reviewed Films.    Notes:                     CLINICAL DATA: 68 year old female with right flank pain.   EXAM:  CT ABDOMEN AND PELVIS WITHOUT CONTRAST   TECHNIQUE:  Multidetector CT imaging of the abdomen and pelvis was performed  following the standard protocol without IV contrast.   COMPARISON: CT Abdomen and Pelvis 08/10/2013.   FINDINGS:  Lower chest: Negative. No pericardial or pleural effusion.   Hepatobiliary: Nodular, cirrhotic liver. No discrete liver lesion.   Calcified gallstones individually up to 12 millimeters. No  pericholecystic inflammation. Bile duct size appears stable.   Pancreas: Negative.   Spleen: Chronic splenomegaly and prominent perisplenic varices.  Gastrosplenic and splenorenal collaterals.   Adrenals/Urinary Tract: Left adrenal gland obscured by chronic  varices. Bulky left nephrolithiasis with individual calculi up to 8  millimeters but no left hydronephrosis. Decompressed left ureter.   Severe right hydronephrosis and perinephric stranding. Likely  forniceal rupture. Nephromegaly. Staghorn intrarenal calculi  individually up to 3 centimeters. Just distal to the ureteropelvic  junction there is a 17 millimeter staghorn calculus (series 2, image  38). But severe right hydroureter (20 millimeters diameter)  continues to the pelvis where there are clustered calculi in the  distal ureter which individually might measure up to  9 millimeters  (series 2, image 67), and a bulky 14 millimeter calculus lodged at  the ureterovesical junction (series 2, image 72).   Decompressed urinary bladder. Superimposed bilateral pelvic  phleboliths.   Stomach/Bowel: Decompressed rectosigmoid colon. Redundant sigmoid  with mild diverticulosis. No active inflammation. Negative  transverse and descending colon. Negative right colon and appendix.  Negative terminal ileum. No dilated small bowel.   Fluid-filled distal esophagus, but the  stomach is largely  decompressed. No free air.   Vascular/Lymphatic: Vascular patency is not evaluated in the absence  of IV contrast. Aortoiliac calcified atherosclerosis.   Reproductive: Surgically absent uterus. Diminutive or absent  ovaries.   Other: Trace if any pelvic free fluid.   Musculoskeletal: Osteopenia. No acute osseous abnormality  identified.   IMPRESSION:  1. Severe Acute Obstructive Uropathy on the right with Forniceal  Rupture.  Bulky, staghorn-like calculi up to 17 mm are lodged at both the  right UPJ and UVJ.  Additional clustered stones in the dilated distal ureter  (steinstrasse).  Additional up to 3 centimeter staghorn calculi within the right  kidney.  2. Lesser left nephrolithiasis.  3. Chronic cirrhosis and stigmata of portal venous hypertension  including splenomegaly and perisplenic varices.  4. Cholelithiasis.    Electronically Signed  By: Genevie Ann M.D.  On: 10/15/2018 23:00      ASSESSMENT:      ICD-10 Details  1 GU:   Ureteral calculus - N20.1   2   Renal calculus - N20.0    PLAN:      1. Large burden right renal and right ureteral calculi: I had a detailed discussion with Alyssa Pena today. We reviewed her imaging and I explained to her the very large burden of stone that she has in her kidney and ureter. Considering the large burden of stone disease, I have recommended that she proceed with definitive therapy that may require a combination of treatments and in multiple stages. She understands that she would require percutaneous nephrostolithotomy for treatment of her large burden right renal calculi and that this might require multiple stages. Although we certainly can attempt to proceed with antegrade ureteroscopy, she also may require retrograde ureteroscopic therapy to clear her ureteral calculi. I anticipate that she will need at least 2 procedures and possibly more. We reviewed this surgery in detail today including the potential risks that  include but are not limited to bleeding, infection, cardiopulmonary risks, risk of damage to adjacent structures such as the colon, lung, kidney, etc, need for multiple procedures.   Considering her history of liver disease, she will have a CBC and coagulation studies checked today. Her renal function and liver function will also be checked today. We will coordinate right renal access with interventional radiology and she will likely benefit from axis to the right lower pole where her main stone burden is identified. This should allow easy access to her entire renal collecting system.   We will plan to schedule a right percutaneous nephrostolithotomy first stage procedure as well as a second-stage percutaneous nephrostolithotomy/ureteroscopy with possible laser lithotripsy.

## 2018-12-24 ENCOUNTER — Ambulatory Visit (HOSPITAL_COMMUNITY): Payer: Medicare Other | Admitting: Anesthesiology

## 2018-12-24 ENCOUNTER — Encounter (HOSPITAL_COMMUNITY): Payer: Self-pay

## 2018-12-24 ENCOUNTER — Ambulatory Visit (HOSPITAL_COMMUNITY)
Admission: RE | Admit: 2018-12-24 | Discharge: 2018-12-24 | Disposition: A | Discharge status: Home / Self Care | Payer: Medicare Other | Source: Ambulatory Visit | Attending: Urology | Admitting: Urology

## 2018-12-24 ENCOUNTER — Inpatient Hospital Stay (HOSPITAL_COMMUNITY)
Admission: RE | Admit: 2018-12-24 | Discharge: 2018-12-26 | DRG: 660 | Disposition: A | Discharge status: Home / Self Care | Payer: Medicare Other | Source: Ambulatory Visit | Attending: Urology | Admitting: Urology

## 2018-12-24 ENCOUNTER — Other Ambulatory Visit: Payer: Self-pay

## 2018-12-24 ENCOUNTER — Encounter (HOSPITAL_COMMUNITY)
Admission: RE | Disposition: A | Discharge status: Home / Self Care | Payer: Self-pay | Source: Ambulatory Visit | Attending: Urology

## 2018-12-24 ENCOUNTER — Ambulatory Visit (HOSPITAL_COMMUNITY): Payer: Medicare Other

## 2018-12-24 ENCOUNTER — Observation Stay (HOSPITAL_COMMUNITY): Payer: Medicare Other

## 2018-12-24 ENCOUNTER — Ambulatory Visit (HOSPITAL_COMMUNITY): Payer: Medicare Other | Admitting: Physician Assistant

## 2018-12-24 DIAGNOSIS — Z801 Family history of malignant neoplasm of trachea, bronchus and lung: Secondary | ICD-10-CM | POA: Diagnosis not present

## 2018-12-24 DIAGNOSIS — E785 Hyperlipidemia, unspecified: Secondary | ICD-10-CM | POA: Diagnosis present

## 2018-12-24 DIAGNOSIS — F329 Major depressive disorder, single episode, unspecified: Secondary | ICD-10-CM | POA: Diagnosis not present

## 2018-12-24 DIAGNOSIS — K766 Portal hypertension: Secondary | ICD-10-CM | POA: Diagnosis present

## 2018-12-24 DIAGNOSIS — K7460 Unspecified cirrhosis of liver: Secondary | ICD-10-CM | POA: Diagnosis not present

## 2018-12-24 DIAGNOSIS — M1990 Unspecified osteoarthritis, unspecified site: Secondary | ICD-10-CM | POA: Diagnosis present

## 2018-12-24 DIAGNOSIS — Z8249 Family history of ischemic heart disease and other diseases of the circulatory system: Secondary | ICD-10-CM | POA: Diagnosis not present

## 2018-12-24 DIAGNOSIS — F419 Anxiety disorder, unspecified: Secondary | ICD-10-CM | POA: Diagnosis not present

## 2018-12-24 DIAGNOSIS — N132 Hydronephrosis with renal and ureteral calculous obstruction: Secondary | ICD-10-CM | POA: Diagnosis not present

## 2018-12-24 DIAGNOSIS — I509 Heart failure, unspecified: Secondary | ICD-10-CM | POA: Diagnosis present

## 2018-12-24 DIAGNOSIS — K7030 Alcoholic cirrhosis of liver without ascites: Secondary | ICD-10-CM | POA: Diagnosis present

## 2018-12-24 DIAGNOSIS — Z880 Allergy status to penicillin: Secondary | ICD-10-CM

## 2018-12-24 DIAGNOSIS — I110 Hypertensive heart disease with heart failure: Secondary | ICD-10-CM | POA: Diagnosis present

## 2018-12-24 DIAGNOSIS — N210 Calculus in bladder: Secondary | ICD-10-CM | POA: Diagnosis not present

## 2018-12-24 DIAGNOSIS — N202 Calculus of kidney with calculus of ureter: Secondary | ICD-10-CM | POA: Diagnosis not present

## 2018-12-24 DIAGNOSIS — Z818 Family history of other mental and behavioral disorders: Secondary | ICD-10-CM | POA: Diagnosis not present

## 2018-12-24 DIAGNOSIS — I5030 Unspecified diastolic (congestive) heart failure: Secondary | ICD-10-CM | POA: Diagnosis not present

## 2018-12-24 DIAGNOSIS — Z888 Allergy status to other drugs, medicaments and biological substances status: Secondary | ICD-10-CM

## 2018-12-24 DIAGNOSIS — Z1159 Encounter for screening for other viral diseases: Secondary | ICD-10-CM | POA: Diagnosis not present

## 2018-12-24 DIAGNOSIS — N200 Calculus of kidney: Secondary | ICD-10-CM

## 2018-12-24 DIAGNOSIS — D696 Thrombocytopenia, unspecified: Secondary | ICD-10-CM | POA: Diagnosis not present

## 2018-12-24 DIAGNOSIS — Z87891 Personal history of nicotine dependence: Secondary | ICD-10-CM

## 2018-12-24 DIAGNOSIS — K8020 Calculus of gallbladder without cholecystitis without obstruction: Secondary | ICD-10-CM | POA: Diagnosis not present

## 2018-12-24 HISTORY — DX: Complete loss of teeth, unspecified cause, unspecified class: K08.109

## 2018-12-24 HISTORY — PX: IR URETERAL STENT RIGHT NEW ACCESS W/O SEP NEPHROSTOMY CATH: IMG6076

## 2018-12-24 HISTORY — DX: Heart failure, unspecified: I50.9

## 2018-12-24 HISTORY — DX: Anxiety disorder, unspecified: F41.9

## 2018-12-24 HISTORY — DX: Depression, unspecified: F32.A

## 2018-12-24 HISTORY — DX: Cardiac arrhythmia, unspecified: I49.9

## 2018-12-24 HISTORY — DX: Unspecified osteoarthritis, unspecified site: M19.90

## 2018-12-24 HISTORY — PX: NEPHROLITHOTOMY: SHX5134

## 2018-12-24 HISTORY — PX: URETEROSCOPY WITH HOLMIUM LASER LITHOTRIPSY: SHX6645

## 2018-12-24 LAB — CBC WITH DIFFERENTIAL/PLATELET
Abs Immature Granulocytes: 0.01 10*3/uL (ref 0.00–0.07)
Basophils Absolute: 0.1 10*3/uL (ref 0.0–0.1)
Basophils Relative: 1 %
Eosinophils Absolute: 0.3 10*3/uL (ref 0.0–0.5)
Eosinophils Relative: 4 %
HCT: 36.5 % (ref 36.0–46.0)
Hemoglobin: 12.5 g/dL (ref 12.0–15.0)
Immature Granulocytes: 0 %
Lymphocytes Relative: 29 %
Lymphs Abs: 1.9 10*3/uL (ref 0.7–4.0)
MCH: 32.8 pg (ref 26.0–34.0)
MCHC: 34.2 g/dL (ref 30.0–36.0)
MCV: 95.8 fL (ref 80.0–100.0)
Monocytes Absolute: 0.9 10*3/uL (ref 0.1–1.0)
Monocytes Relative: 13 %
Neutro Abs: 3.5 10*3/uL (ref 1.7–7.7)
Neutrophils Relative %: 53 %
Platelets: 81 10*3/uL — ABNORMAL LOW (ref 150–400)
RBC: 3.81 MIL/uL — ABNORMAL LOW (ref 3.87–5.11)
RDW: 14.4 % (ref 11.5–15.5)
WBC: 6.6 10*3/uL (ref 4.0–10.5)
nRBC: 0 % (ref 0.0–0.2)

## 2018-12-24 LAB — TYPE AND SCREEN
ABO/RH(D): B NEG
Antibody Screen: NEGATIVE

## 2018-12-24 LAB — CBC
HCT: 36.8 % (ref 36.0–46.0)
Hemoglobin: 12.5 g/dL (ref 12.0–15.0)
MCH: 32.9 pg (ref 26.0–34.0)
MCHC: 34 g/dL (ref 30.0–36.0)
MCV: 96.8 fL (ref 80.0–100.0)
Platelets: 73 10*3/uL — ABNORMAL LOW (ref 150–400)
RBC: 3.8 MIL/uL — ABNORMAL LOW (ref 3.87–5.11)
RDW: 14.6 % (ref 11.5–15.5)
WBC: 10.1 10*3/uL (ref 4.0–10.5)
nRBC: 0 % (ref 0.0–0.2)

## 2018-12-24 LAB — ABO/RH: ABO/RH(D): B NEG

## 2018-12-24 LAB — COMPREHENSIVE METABOLIC PANEL
ALT: 17 U/L (ref 0–44)
AST: 27 U/L (ref 15–41)
Albumin: 2.9 g/dL — ABNORMAL LOW (ref 3.5–5.0)
Alkaline Phosphatase: 65 U/L (ref 38–126)
Anion gap: 7 (ref 5–15)
BUN: 13 mg/dL (ref 8–23)
CO2: 24 mmol/L (ref 22–32)
Calcium: 8.9 mg/dL (ref 8.9–10.3)
Chloride: 107 mmol/L (ref 98–111)
Creatinine, Ser: 0.85 mg/dL (ref 0.44–1.00)
GFR calc Af Amer: >60 mL/min (ref >60)
GFR calc non Af Amer: >60 mL/min (ref >60)
Glucose, Bld: 94 mg/dL (ref 70–99)
Potassium: 3.6 mmol/L (ref 3.5–5.1)
Sodium: 138 mmol/L (ref 135–145)
Total Bilirubin: 2 mg/dL — ABNORMAL HIGH (ref 0.3–1.2)
Total Protein: 8.1 g/dL (ref 6.5–8.1)

## 2018-12-24 LAB — PROTIME-INR
INR: 1.1 (ref 0.8–1.2)
Prothrombin Time: 14.3 seconds (ref 11.4–15.2)

## 2018-12-24 IMAGING — CT CT ABDOMEN AND PELVIS WITHOUT CONTRAST
2 of 4 series · 15 of 46 positions shown, 17 images · non-contrast
Comparison: [DATE] and earlier.

CLINICAL DATA: 68-year-old who underwent RIGHT percutaneous
nephrolithotomy earlier today. Assess for residual stone burden.

EXAM:
CT ABDOMEN AND PELVIS WITHOUT CONTRAST
TECHNIQUE: Multidetector CT imaging of the abdomen and pelvis was performed
following the standard protocol without IV contrast.

[Series 5: axial st · axial · 0.83mm/px · z∈[+799,+1209]mm · 12 of 90 slices shown, 14 images]
[im 4/90  soft-tissue]
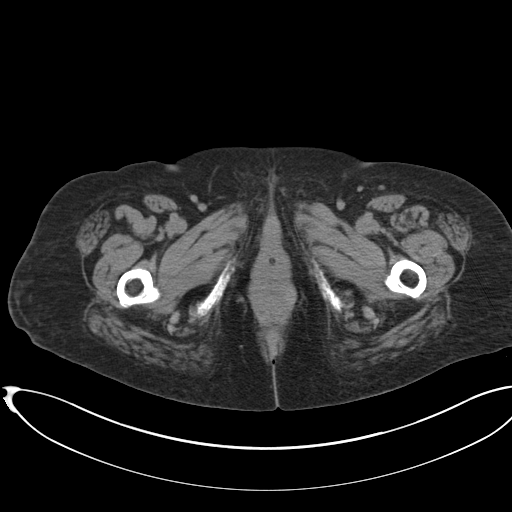
[im 4/90  bone]
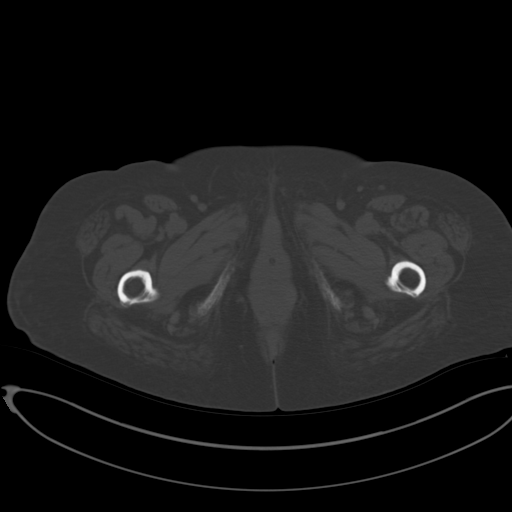
[im 12/90  soft-tissue]
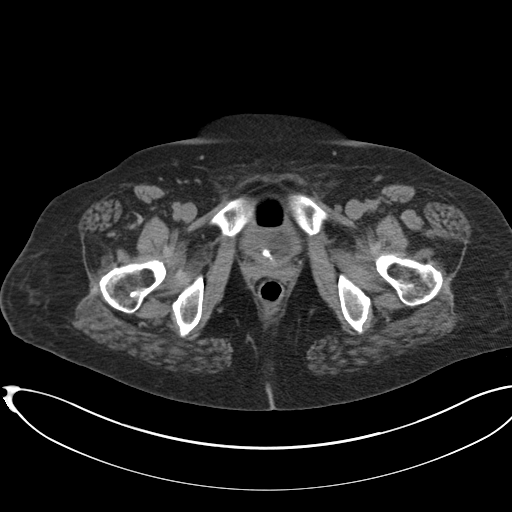
[im 20/90  soft-tissue]
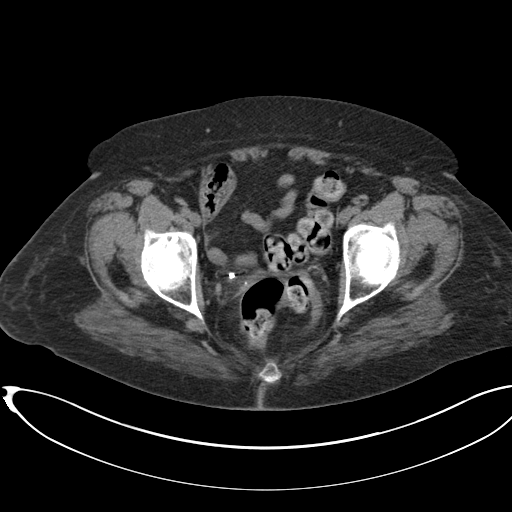
[im 28/90  soft-tissue]
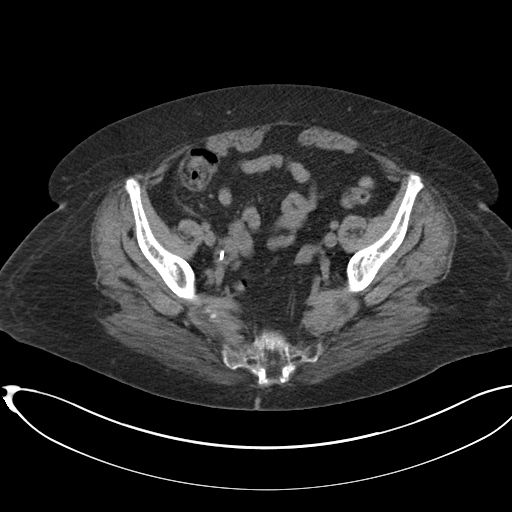
[im 35/90  soft-tissue]
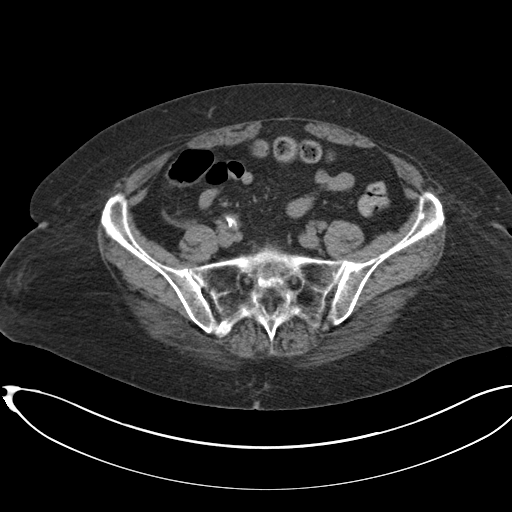
[im 43/90  soft-tissue]
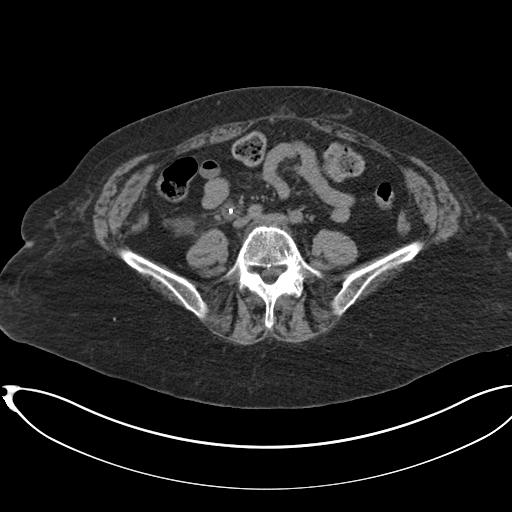
[im 47/90  soft-tissue]
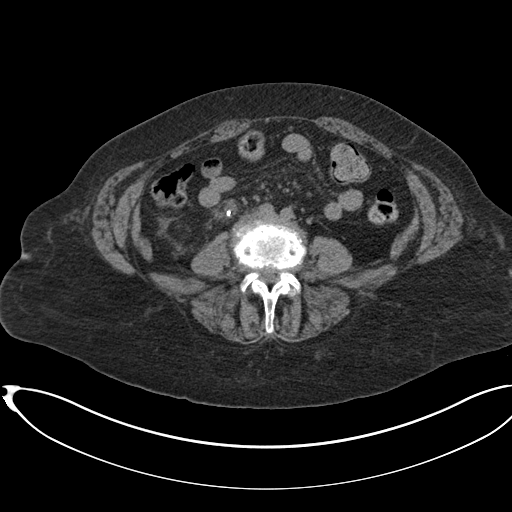
[im 55/90  soft-tissue]
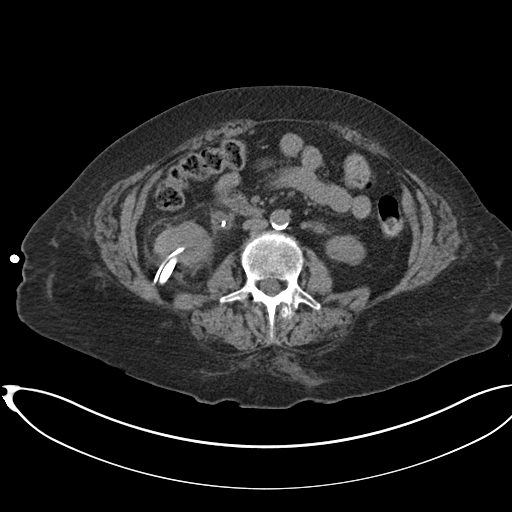
[im 62/90  soft-tissue]
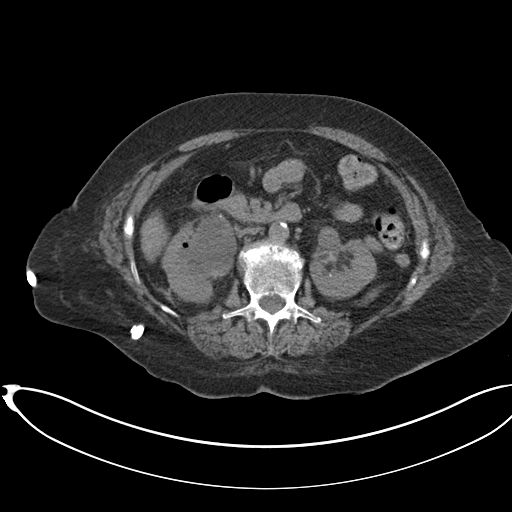
[im 62/90  bone]
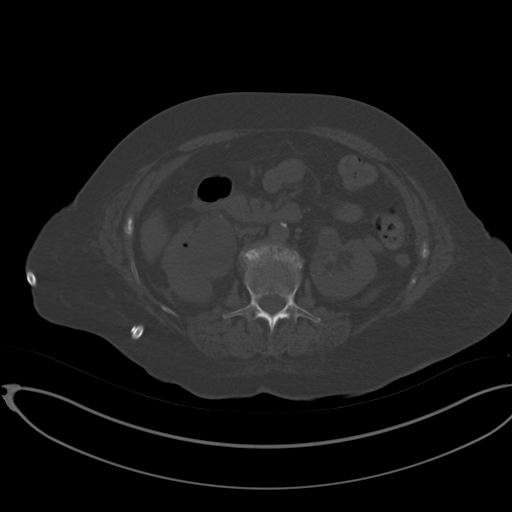
[im 70/90  soft-tissue]
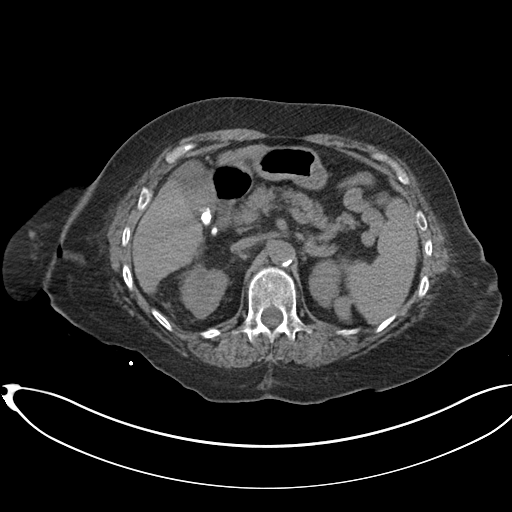
[im 78/90  soft-tissue]
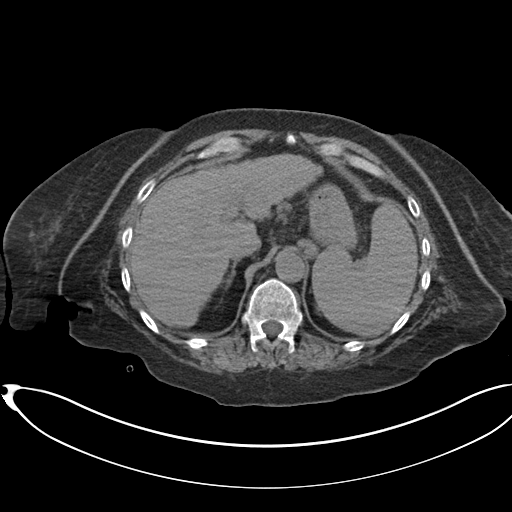
[im 86/90  soft-tissue]
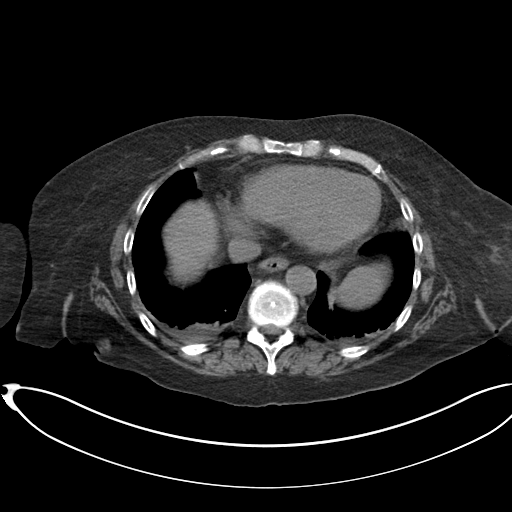

[Series 8: coronal st · coronal · 0.74mm/px · 3 of 143 slices shown]
[im 48/143  soft-tissue]
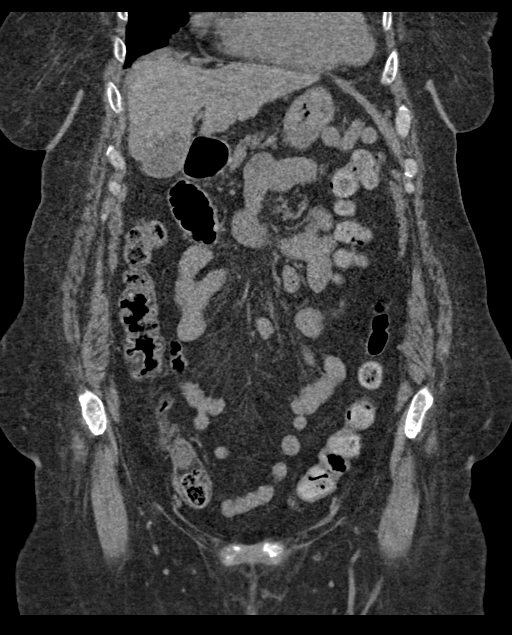
[im 64/143  soft-tissue]
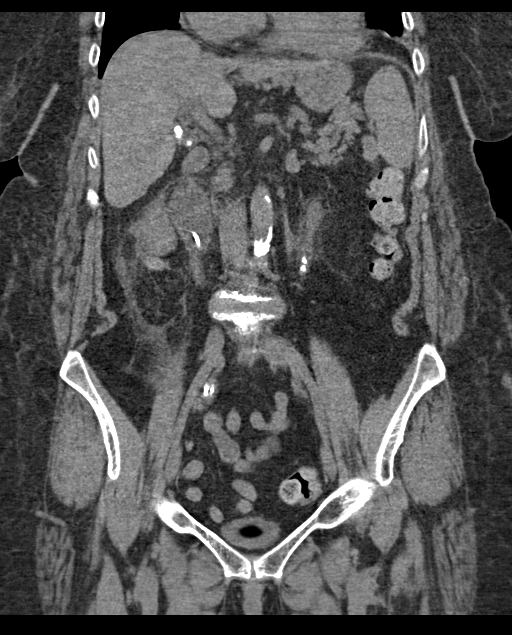
[im 79/143  soft-tissue]
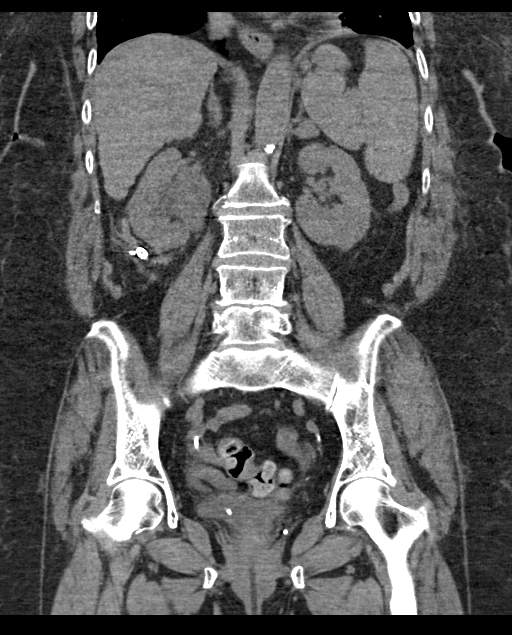

[15 of 46 positions shown; findings below may reference images not displayed]

Images obtained from the RIGHT
percutaneous nephrolithotomy earlier today are also correlated.
FINDINGS: Lower chest: Dependent atelectasis in the lower lobes. Small RIGHT
pleural effusion. Heart mildly enlarged.

Hepatobiliary: Contour irregularity involving the liver. Otherwise
normal unenhanced appearance of the liver. Multiple calcified
gallstones in the gallbladder, the largest measuring approximately
1.2 cm. No pericholecystic inflammation. No biliary ductal dilation.

Pancreas: Normal unenhanced appearance.

Spleen: Splenomegaly.  Otherwise normal unenhanced appearance.

Adrenals/Urinary Tract: Normal appearing adrenal glands. Residual
approximate 6 mm calculus in the mid RIGHT ureter adjacent to the
nephroureteral catheter. Adjacent small (approximate 4 mm) stone
fragments in the distal RIGHT ureter. Very small (2-3 mm) residual
calculus in a POSTERIOR LOWER pole calyx of the RIGHT kidney. Small
stone fragments in the urinary bladder which is decompressed by
Foley catheter. Residual RIGHT hydroureteronephrosis.

Two adjacent calculi spanning approximately 11 mm in the LEFT renal
pelvis and an approximate 3 mm calculus in the proximal LEFT ureter;
these calculi have changed locations since the CT 1 month ago.
Approximate 7 mm calculus in a LOWER pole calyx of the LEFT kidney
which is unchanged. Mild LEFT hydronephrosis.

Stomach/Bowel: Stomach normal in appearance for the degree of
distention. Normal-appearing small bowel. Moderate colonic stool
burden. Scattered colonic diverticula without evidence of acute
diverticulitis. Appendix not conspicuous, but no pericecal
inflammation.

Vascular/Lymphatic: Moderate aortic atherosclerosis without evidence
of aneurysm. Large vein in the LEFT UPPER QUADRANT of the abdomen,
indicating a spontaneous splenorenal shunt. There may be gastric
varices as well.

No pathologic lymphadenopathy.

Reproductive: Surgically absent uterus. No adnexal masses.

Other: Edema involving the retroperitoneum on the LEFT related to
the surgical procedure today. No evidence of hematoma.

Musculoskeletal: Multilevel degenerative disc disease, spondylosis
and facet degenerative changes involving the lumbar spine. No acute
findings.
IMPRESSION: 1. Residual approximate 6 mm calculus in the mid RIGHT ureter
adjacent to the nephroureteral catheter. Adjacent small (approximate
4 mm) stone fragments in the distal RIGHT ureter.
2. Very small residual calculus in a POSTERIOR LOWER pole calyx of
the RIGHT kidney.
3. Two adjacent calculi spanning approximately 11 mm in the LEFT
renal pelvis and an approximate 3 mm calculus in the proximal LEFT
ureter; these calculi have changed locations since the CT 1 month
ago.
4. Small stone fragments in the urinary bladder which is
decompressed by Foley catheter.
5. Mild LEFT hydronephrosis.
6. Hepatic cirrhosis.
7. Splenomegaly. Spontaneous splenorenal shunt as noted on prior
CTs. There may be gastric varices as well.
8. Cholelithiasis without CT evidence of acute cholecystitis.

## 2018-12-24 IMAGING — XA IR URETURAL STENT RIGHT NEW ACCESS W/O SEP NEPHROSTOMY CATH
6 series · 8 of 8 positions shown · non-contrast
Comparison: None.

INDICATION: 68-year-old female nephrolithiasis, previous right ureteral
obstruction, presenting for right kidney access for nephrolithotomy

EXAM:
IR URETURAL STENT RIGHT NEW ACCESS W/O SEP NEPHROSTOMY CATH

[Series 3: care single · 1 of 1 slices shown (1 of 5)]
[im 1/1]
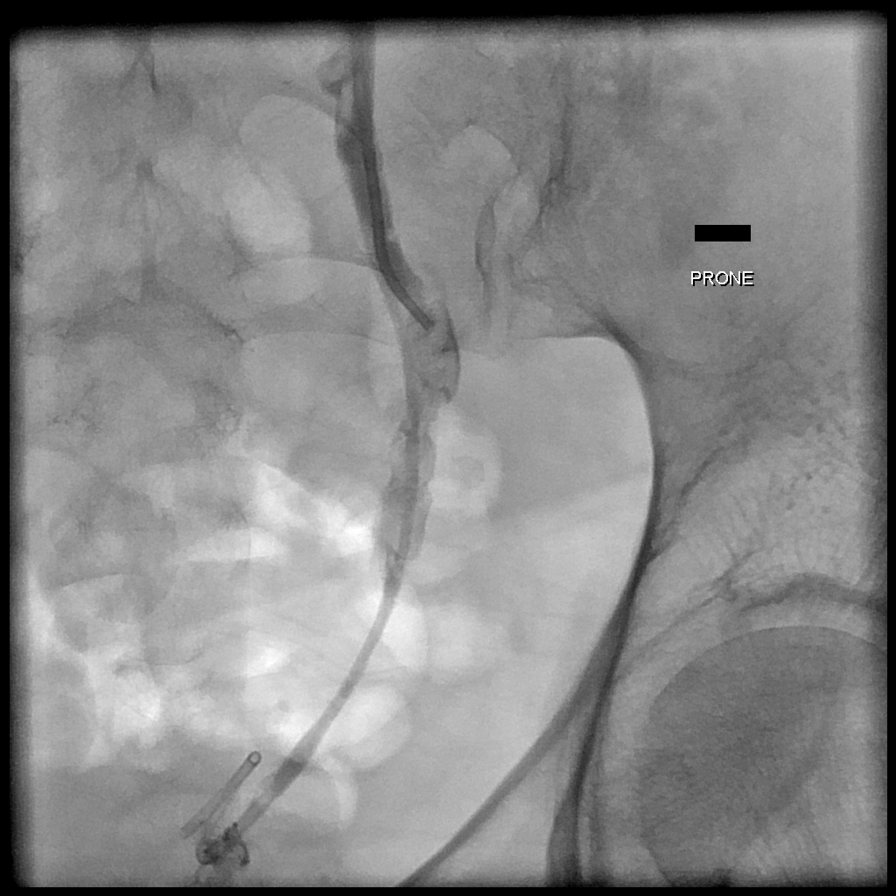

[Series 4: care single · 1 of 1 slices shown (2 of 5)]
[im 1/1]
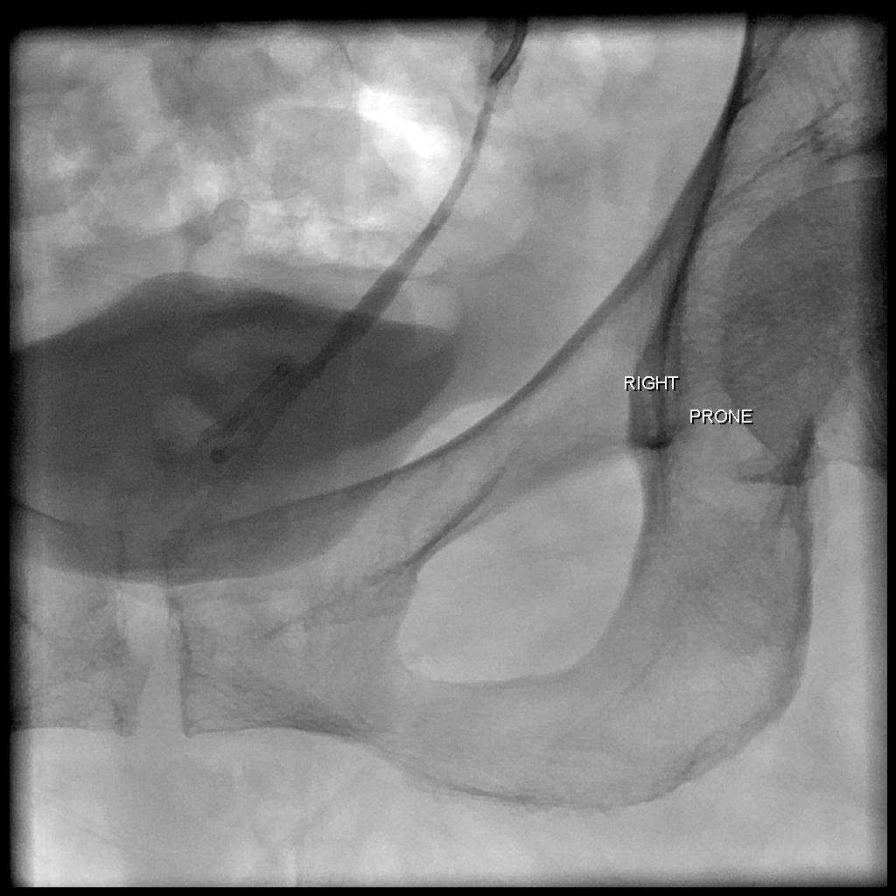

[Series 5: fl - angio · 3 of 24 frames shown]
[frame 4/24]
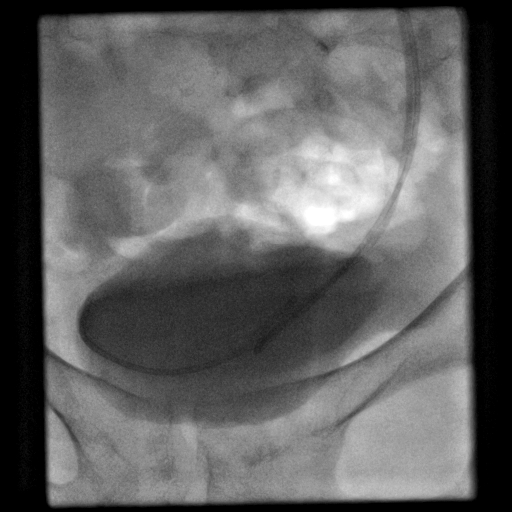
[frame 13/24]
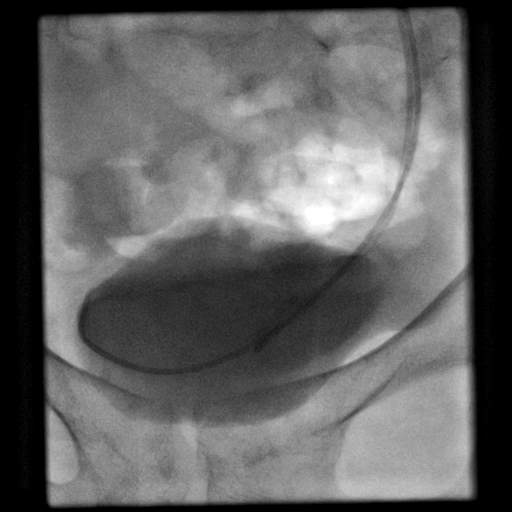
[frame 21/24]
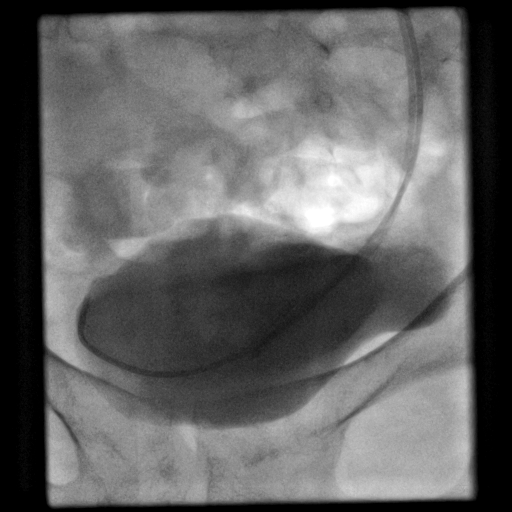

[Series 6: care single · 1 of 1 slices shown (3 of 5)]
[im 1/1]
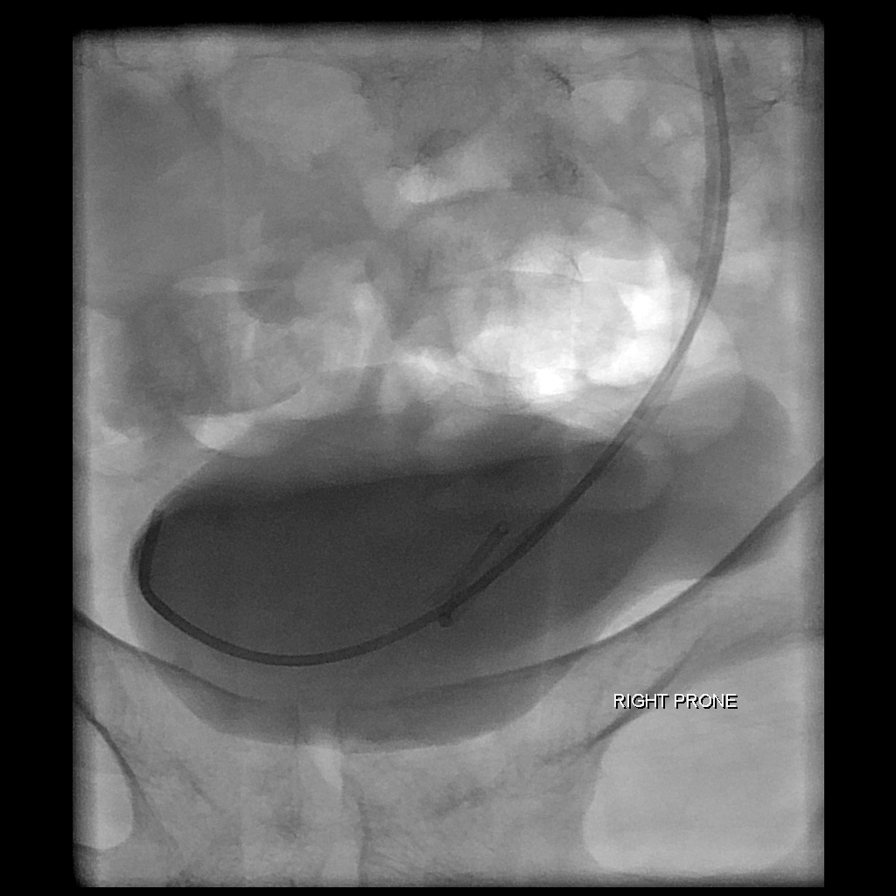

[Series 7: care single · 1 of 1 slices shown (4 of 5)]
[im 1/1]
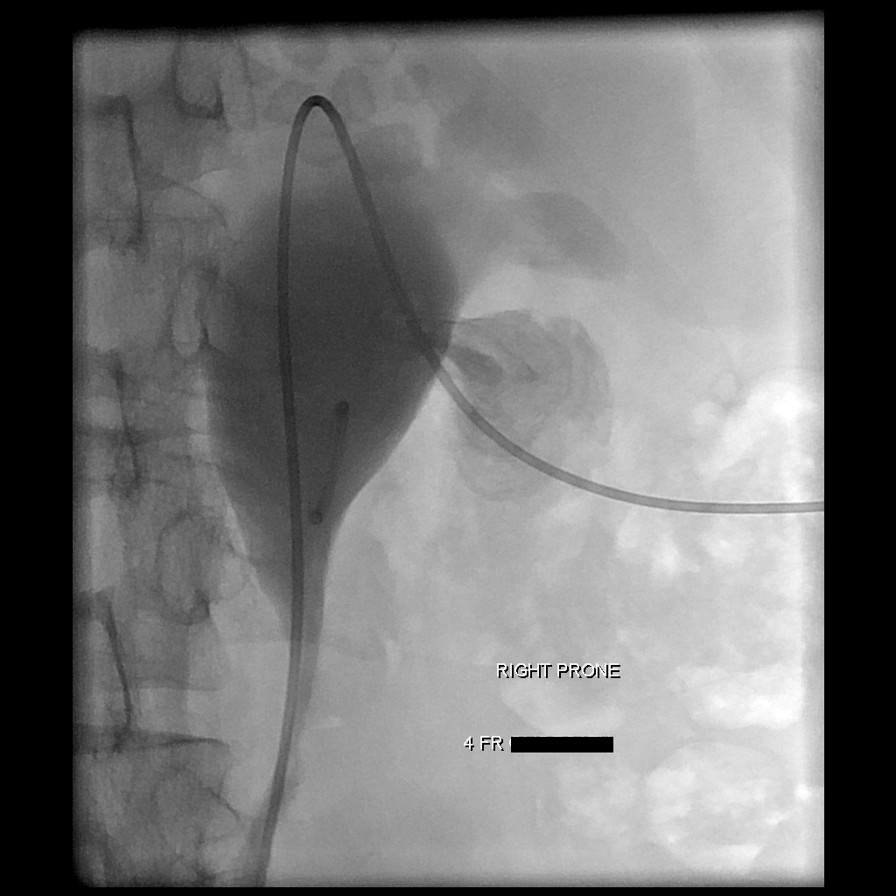

[Series 8: care single · 1 of 1 slices shown (5 of 5)]
[im 1/1]
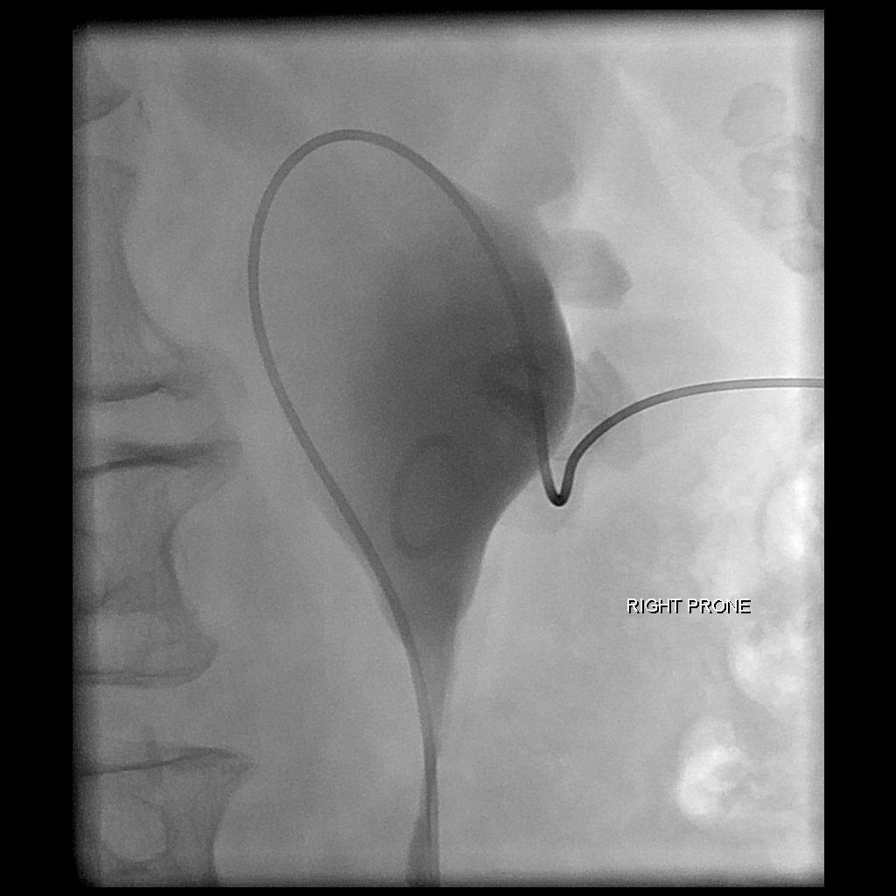

[8 of 8 positions shown; findings below may reference images not displayed]

MEDICATIONS:
None

ANESTHESIA/SEDATION:
Fentanyl 100 mcg IV; Versed 4.0 mg IV

Moderate Sedation Time:  28 minutes

The patient was continuously monitored during the procedure by the
interventional radiology nurse under my direct supervision.

CONTRAST:  20mL OMNIPAQUE IOHEXOL 300 MG/ML SOLN - administered into
the collecting system(s)

FLUOROSCOPY TIME:  Fluoroscopy Time: 7 minutes 42 seconds (182 mGy).

COMPLICATIONS:
None

PROCEDURE:
Informed written consent was obtained from the patient after a
thorough discussion of the procedural risks, benefits and
alternatives. All questions were addressed. Maximal Sterile Barrier
Technique was utilized including caps, mask, sterile gowns, sterile
gloves, sterile drape, hand hygiene and skin antiseptic. A timeout
was performed prior to the initiation of the procedure. The patient
was then prepped and draped in the usual sterile fashion. 1%
lidocaine was used to anesthetize the skin and subcutaneous tissues
for local anesthesia.

Patient positioned prone position on the fluoroscopy table.
Fluoroscopic survey of the right flank was performed with images
stored and sent to PACs.

The low-profile Accustick needle was then used to access a posterior
inferior calyx with fluoroscopic guidance, targeting the existing
stone burden in the lower posterior pole collecting system. With
spontaneous urine returned through the needle, contrast injection
confirmed location. A 018 micro wire was then advanced into the
collecting system under fluoroscopy.

A small incision was made with an 11 blade scalpel, and the needle
was removed from the wire.

An Accustick system was then advanced over the wire into the
collecting system under fluoroscopy. The metal stiffener and inner
dilator were removed. Bentson wire was advanced. Kumpe the catheter
was use attempting to navigate into the ureter with the Bentson
wire. Glidewire was then selected to help advanced the catheter into
the distal ureter. Resistance was encountered within distal ureter.
Exchange length stiff Glidewire was then advanced through the
catheter into the bladder and the 5 French Kumpe the catheter was
removed and exchanged for a 4 French glide cath. This was advanced
on the wire into the urinary bladder. Wire was removed and contrast
confirmed catheter within the urinary bladder.

Final images were recorded.

Patient tolerated the procedure well and remained hemodynamically
stable throughout.

No complications were encountered and no significant blood loss
encountered
IMPRESSION: Status post placement of right-sided percutaneous nephroureteral
drain, as direct access for forthcoming nephrolithotomy.

## 2018-12-24 SURGERY — NEPHROLITHOTOMY PERCUTANEOUS
Anesthesia: General | Laterality: Right

## 2018-12-24 MED ORDER — LACTATED RINGERS IV SOLN
INTRAVENOUS | Status: DC
  Administered 2018-12-24 (×3): via INTRAVENOUS

## 2018-12-24 MED ORDER — PHENYLEPHRINE 40 MCG/ML (10ML) SYRINGE FOR IV PUSH (FOR BLOOD PRESSURE SUPPORT)
PREFILLED_SYRINGE | INTRAVENOUS | Status: DC | PRN
  Administered 2018-12-24: 40 ug via INTRAVENOUS
  Administered 2018-12-24 (×2): 80 ug via INTRAVENOUS

## 2018-12-24 MED ORDER — ONDANSETRON HCL 4 MG/2ML IJ SOLN: 4.0000 mg | Freq: Once | INTRAMUSCULAR | Status: DC | PRN

## 2018-12-24 MED ORDER — IOHEXOL 300 MG/ML  SOLN
INTRAMUSCULAR | Status: DC | PRN
  Administered 2018-12-24: 30 mL via URETHRAL

## 2018-12-24 MED ORDER — MIDAZOLAM HCL 2 MG/2ML IJ SOLN
INTRAMUSCULAR | Status: AC | PRN
  Administered 2018-12-24 (×4): 1 mg via INTRAVENOUS

## 2018-12-24 MED ORDER — LIDOCAINE HCL 1 % IJ SOLN
INTRAMUSCULAR | Status: AC
  Filled 2018-12-24: qty 20

## 2018-12-24 MED ORDER — BELLADONNA ALKALOIDS-OPIUM 16.2-60 MG RE SUPP: 1.0000 | Freq: Four times a day (QID) | RECTAL | Status: DC | PRN

## 2018-12-24 MED ORDER — KCL IN DEXTROSE-NACL 20-5-0.45 MEQ/L-%-% IV SOLN
INTRAVENOUS | Status: DC
  Administered 2018-12-24 – 2018-12-25 (×2): via INTRAVENOUS
  Filled 2018-12-24 (×2): qty 1000

## 2018-12-24 MED ORDER — HYDROMORPHONE HCL 1 MG/ML IJ SOLN
0.5000 mg | INTRAMUSCULAR | Status: DC | PRN
  Administered 2018-12-25: 04:00:00 1 mg via INTRAVENOUS
  Filled 2018-12-24: qty 1

## 2018-12-24 MED ORDER — HYDROMORPHONE HCL 2 MG/ML IJ SOLN
INTRAMUSCULAR | Status: AC
  Filled 2018-12-24: qty 1

## 2018-12-24 MED ORDER — DIPHENHYDRAMINE HCL 12.5 MG/5ML PO ELIX: 12.5000 mg | ORAL_SOLUTION | Freq: Four times a day (QID) | ORAL | Status: DC | PRN

## 2018-12-24 MED ORDER — CARVEDILOL 6.25 MG PO TABS
6.2500 mg | ORAL_TABLET | Freq: Two times a day (BID) | ORAL | Status: DC
  Administered 2018-12-24 – 2018-12-26 (×4): 6.25 mg via ORAL
  Filled 2018-12-24 (×4): qty 1

## 2018-12-24 MED ORDER — DEXAMETHASONE SODIUM PHOSPHATE 10 MG/ML IJ SOLN
INTRAMUSCULAR | Status: DC | PRN
  Administered 2018-12-24: 10 mg via INTRAVENOUS

## 2018-12-24 MED ORDER — ACETAMINOPHEN 325 MG PO TABS
650.0000 mg | ORAL_TABLET | ORAL | Status: DC | PRN
  Administered 2018-12-25: 650 mg via ORAL
  Filled 2018-12-24: qty 2

## 2018-12-24 MED ORDER — LIDOCAINE 2% (20 MG/ML) 5 ML SYRINGE
INTRAMUSCULAR | Status: DC | PRN
  Administered 2018-12-24: 60 mg via INTRAVENOUS

## 2018-12-24 MED ORDER — ONDANSETRON HCL 4 MG/2ML IJ SOLN
INTRAMUSCULAR | Status: DC | PRN
  Administered 2018-12-24: 4 mg via INTRAVENOUS

## 2018-12-24 MED ORDER — ONDANSETRON HCL 4 MG/2ML IJ SOLN
4.0000 mg | INTRAMUSCULAR | Status: DC | PRN
  Administered 2018-12-25: 4 mg via INTRAVENOUS
  Filled 2018-12-24: qty 2

## 2018-12-24 MED ORDER — FENTANYL CITRATE (PF) 100 MCG/2ML IJ SOLN
INTRAMUSCULAR | Status: AC
  Filled 2018-12-24: qty 2

## 2018-12-24 MED ORDER — IOHEXOL 300 MG/ML  SOLN
50.0000 mL | Freq: Once | INTRAMUSCULAR | Status: AC | PRN
  Administered 2018-12-24: 20 mL

## 2018-12-24 MED ORDER — FENTANYL CITRATE (PF) 100 MCG/2ML IJ SOLN: 25.0000 ug | INTRAMUSCULAR | Status: DC | PRN

## 2018-12-24 MED ORDER — MIDAZOLAM HCL 5 MG/5ML IJ SOLN
INTRAMUSCULAR | Status: DC | PRN
  Administered 2018-12-24: 2 mg via INTRAVENOUS

## 2018-12-24 MED ORDER — SODIUM CHLORIDE 0.9 % IR SOLN
Status: DC | PRN
  Administered 2018-12-24: 12000 mL

## 2018-12-24 MED ORDER — FLUOXETINE HCL 20 MG PO CAPS
20.0000 mg | ORAL_CAPSULE | Freq: Every day | ORAL | Status: DC
  Administered 2018-12-25 – 2018-12-26 (×2): 20 mg via ORAL
  Filled 2018-12-24 (×2): qty 1

## 2018-12-24 MED ORDER — HYDROMORPHONE HCL 1 MG/ML IJ SOLN
INTRAMUSCULAR | Status: DC | PRN
  Administered 2018-12-24 (×2): 0.5 mg via INTRAVENOUS

## 2018-12-24 MED ORDER — DOCUSATE SODIUM 100 MG PO CAPS: 100.0000 mg | ORAL_CAPSULE | Freq: Two times a day (BID) | ORAL | 0 refills | Status: DC

## 2018-12-24 MED ORDER — LIDOCAINE HCL (PF) 1 % IJ SOLN
INTRAMUSCULAR | Status: AC | PRN
  Administered 2018-12-24: 10 mL

## 2018-12-24 MED ORDER — PROPOFOL 10 MG/ML IV BOLUS
INTRAVENOUS | Status: DC | PRN
  Administered 2018-12-24: 150 mg via INTRAVENOUS

## 2018-12-24 MED ORDER — MIDAZOLAM HCL 2 MG/2ML IJ SOLN
INTRAMUSCULAR | Status: AC
  Filled 2018-12-24: qty 2

## 2018-12-24 MED ORDER — FENTANYL CITRATE (PF) 100 MCG/2ML IJ SOLN
INTRAMUSCULAR | Status: DC | PRN
  Administered 2018-12-24: 100 ug via INTRAVENOUS

## 2018-12-24 MED ORDER — MECLIZINE HCL 25 MG PO TABS: 25.0000 mg | ORAL_TABLET | Freq: Three times a day (TID) | ORAL | Status: DC | PRN

## 2018-12-24 MED ORDER — MIDAZOLAM HCL 2 MG/2ML IJ SOLN
INTRAMUSCULAR | Status: AC
  Filled 2018-12-24: qty 4

## 2018-12-24 MED ORDER — ROCURONIUM BROMIDE 10 MG/ML (PF) SYRINGE
PREFILLED_SYRINGE | INTRAVENOUS | Status: DC | PRN
  Administered 2018-12-24: 40 mg via INTRAVENOUS
  Administered 2018-12-24: 20 mg via INTRAVENOUS
  Administered 2018-12-24 (×2): 10 mg via INTRAVENOUS

## 2018-12-24 MED ORDER — DIPHENHYDRAMINE HCL 50 MG/ML IJ SOLN: 12.5000 mg | Freq: Four times a day (QID) | INTRAMUSCULAR | Status: DC | PRN

## 2018-12-24 MED ORDER — EPHEDRINE SULFATE-NACL 50-0.9 MG/10ML-% IV SOSY
PREFILLED_SYRINGE | INTRAVENOUS | Status: DC | PRN
  Administered 2018-12-24: 10 mg via INTRAVENOUS
  Administered 2018-12-24: 5 mg via INTRAVENOUS
  Administered 2018-12-24: 10 mg via INTRAVENOUS

## 2018-12-24 MED ORDER — HYDROCODONE-ACETAMINOPHEN 5-325 MG PO TABS: 1.0000 | ORAL_TABLET | Freq: Four times a day (QID) | ORAL | 0 refills | Status: DC | PRN

## 2018-12-24 MED ORDER — SODIUM CHLORIDE 0.9 % IV SOLN
INTRAVENOUS | Status: AC
  Filled 2018-12-24: qty 20

## 2018-12-24 MED ORDER — LORAZEPAM 1 MG PO TABS
2.0000 mg | ORAL_TABLET | Freq: Every day | ORAL | Status: DC
  Filled 2018-12-24: qty 2

## 2018-12-24 MED ORDER — DOCUSATE SODIUM 100 MG PO CAPS
100.0000 mg | ORAL_CAPSULE | Freq: Two times a day (BID) | ORAL | Status: DC
  Administered 2018-12-24 – 2018-12-25 (×3): 100 mg via ORAL
  Filled 2018-12-24 (×3): qty 1

## 2018-12-24 MED ORDER — HYDROCODONE-ACETAMINOPHEN 5-325 MG PO TABS: 1.0000 | ORAL_TABLET | ORAL | Status: DC | PRN

## 2018-12-24 MED ORDER — SODIUM CHLORIDE 0.9 % IV SOLN
2.0000 g | Freq: Once | INTRAVENOUS | Status: AC
  Administered 2018-12-24: 10:00:00 2 g via INTRAVENOUS

## 2018-12-24 MED ORDER — SODIUM CHLORIDE (PF) 0.9 % IJ SOLN
INTRAMUSCULAR | Status: AC
  Filled 2018-12-24: qty 10

## 2018-12-24 MED ORDER — FENTANYL CITRATE (PF) 100 MCG/2ML IJ SOLN
INTRAMUSCULAR | Status: AC
  Filled 2018-12-24: qty 4

## 2018-12-24 MED ORDER — FENTANYL CITRATE (PF) 100 MCG/2ML IJ SOLN
INTRAMUSCULAR | Status: AC | PRN
  Administered 2018-12-24 (×2): 50 ug via INTRAVENOUS

## 2018-12-24 MED ORDER — SUGAMMADEX SODIUM 200 MG/2ML IV SOLN
INTRAVENOUS | Status: DC | PRN
  Administered 2018-12-24: 200 mg via INTRAVENOUS

## 2018-12-24 SURGICAL SUPPLY — 57 items
APL SKNCLS STERI-STRIP NONHPOA (GAUZE/BANDAGES/DRESSINGS) ×1
BAG URINE DRAINAGE (UROLOGICAL SUPPLIES) ×3 IMPLANT
BAG URO CATCHER STRL LF (MISCELLANEOUS) ×3 IMPLANT
BASKET LASER NITINOL 1.9FR (BASKET) ×3 IMPLANT
BASKET STONE NITINOL 3FRX115MB (UROLOGICAL SUPPLIES) IMPLANT
BASKET ZERO TIP NITINOL 2.4FR (BASKET) IMPLANT
BENZOIN TINCTURE PRP APPL 2/3 (GAUZE/BANDAGES/DRESSINGS) ×3 IMPLANT
BLADE SURG SZ10 CARB STEEL (BLADE) ×3 IMPLANT
BOOTIES KNEE HIGH SLOAN (MISCELLANEOUS) ×3 IMPLANT
BSKT STON RTRVL 120 1.9FR (BASKET) ×1
BSKT STON RTRVL ZERO TP 2.4FR (BASKET)
CATH COUNCIL 22FR (CATHETERS) ×2 IMPLANT
CATH FOLEY 2W COUNCIL 20FR 5CC (CATHETERS) IMPLANT
CATH IMAGER II 65CM (CATHETERS) IMPLANT
CATH INTERMIT  6FR 70CM (CATHETERS) IMPLANT
CATH ROBINSON RED A/P 20FR (CATHETERS) IMPLANT
CATH URET DUAL LUMEN 6-10FR 50 (CATHETERS) ×5 IMPLANT
CATH UROLOGY TORQUE 40 (MISCELLANEOUS) IMPLANT
CATH X-FORCE N30 NEPHROSTOMY (TUBING) ×3 IMPLANT
CLOTH BEACON ORANGE TIMEOUT ST (SAFETY) ×3 IMPLANT
COVER SURGICAL LIGHT HANDLE (MISCELLANEOUS) ×3 IMPLANT
COVER WAND RF STERILE (DRAPES) IMPLANT
DRAPE C-ARM 42X120 X-RAY (DRAPES) ×3 IMPLANT
DRAPE LINGEMAN PERC (DRAPES) ×3 IMPLANT
DRAPE SURG IRRIG POUCH 19X23 (DRAPES) ×3 IMPLANT
DRSG PAD ABDOMINAL 8X10 ST (GAUZE/BANDAGES/DRESSINGS) ×6 IMPLANT
DRSG TEGADERM 8X12 (GAUZE/BANDAGES/DRESSINGS) ×6 IMPLANT
EXTRACTOR STONE NITINOL NGAGE (UROLOGICAL SUPPLIES) ×2 IMPLANT
FIBER LASER FLEXIVA 365 (UROLOGICAL SUPPLIES) IMPLANT
FIBER LASER TRAC TIP (UROLOGICAL SUPPLIES) IMPLANT
GAUZE SPONGE 4X4 12PLY STRL (GAUZE/BANDAGES/DRESSINGS) ×3 IMPLANT
GLOVE BIOGEL M STRL SZ7.5 (GLOVE) ×11 IMPLANT
GOWN STRL REUS W/TWL LRG LVL3 (GOWN DISPOSABLE) ×6 IMPLANT
GUIDEWIRE AMPLAZ .035X145 (WIRE) ×6 IMPLANT
GUIDEWIRE ANG ZIPWIRE 038X150 (WIRE) ×2 IMPLANT
GUIDEWIRE STR DUAL SENSOR (WIRE) ×3 IMPLANT
IV NS 1000ML (IV SOLUTION) ×3
IV NS 1000ML BAXH (IV SOLUTION) ×1 IMPLANT
KIT BASIN OR (CUSTOM PROCEDURE TRAY) ×3 IMPLANT
KIT PROBE TRILOGY 3.9X350 (MISCELLANEOUS) ×2 IMPLANT
KIT TURNOVER KIT A (KITS) IMPLANT
MANIFOLD NEPTUNE II (INSTRUMENTS) ×3 IMPLANT
NS IRRIG 1000ML POUR BTL (IV SOLUTION) ×3 IMPLANT
PACK CYSTO (CUSTOM PROCEDURE TRAY) ×3 IMPLANT
SHEATH URETERAL 12FRX28CM (UROLOGICAL SUPPLIES) ×2 IMPLANT
SHEATH URETERAL 12FRX35CM (MISCELLANEOUS) IMPLANT
SPONGE LAP 4X18 RFD (DISPOSABLE) ×3 IMPLANT
STENT ENDOURETEROTOMY 7-14 26C (STENTS) IMPLANT
SUT SILK 2 0 30  PSL (SUTURE)
SUT SILK 2 0 30 PSL (SUTURE) IMPLANT
SYR 20CC LL (SYRINGE) ×3 IMPLANT
TOWEL OR 17X26 10 PK STRL BLUE (TOWEL DISPOSABLE) ×3 IMPLANT
TRAY FOLEY MTR SLVR 16FR STAT (SET/KITS/TRAYS/PACK) ×3 IMPLANT
TUBING CONNECTING 10 (TUBING) ×3 IMPLANT
TUBING CONNECTING 10' (TUBING) ×2
TUBING UROLOGY SET (TUBING) ×3 IMPLANT
WATER STERILE IRR 1000ML POUR (IV SOLUTION) ×3 IMPLANT

## 2018-12-24 NOTE — Progress Notes (Signed)
Patient ID: Alyssa Pena, female   DOB: 1951-06-07, 68 y.o.   MRN: [id number]  Post-op note  Subjective: The patient is doing well.  No complaints.  Objective: Vital signs in last 24 hours: Temp:  [97.3 F (36.3 C)-98 F (36.7 C)] 97.4 F (36.3 C) (07/09 1645) Pulse Rate:  [48-107] 58 (07/09 1715) Resp:  [10-18] 16 (07/09 1715) BP: (108-173)/(52-133) 137/60 (07/09 1715) SpO2:  [95 %-100 %] 95 % (07/09 1715) Weight:  [81.6 kg] 81.6 kg (07/09 0851)  Intake/Output from previous day: No intake/output data recorded. Intake/Output this shift: Total I/O In: 2000 [I.V.:2000] Out: 420 [Urine:320; Blood:100]  Physical Exam:  General: Alert and oriented. Abdomen: Soft, Nondistended. GU: Nephrostomy tube draining.  Lab Results: Recent Labs    12/24/18 0830 12/24/18 1627  HGB 12.5 12.5  HCT 36.5 36.8    Assessment/Plan: POD#0   1) Continue to monitor, will check CT stone study this evening   Pryor Curia. MD   LOS: 0 days   Dutch Gray 12/24/2018, 5:41 PM

## 2018-12-24 NOTE — Procedures (Signed)
Interventional Radiology Procedure Note  Procedure: Placement of a right percutaneous nephro-ureteral access catheter for nephro-lithotomy today.    The catheter is a 38F glide-cath, which will accept any 035 wire to the bladder.  The catheter is adjacent the ureteral stent, which could become displaced with manipulation.   The catheter enters the lower posterior calyx with the large stone.   . Complications: None  Recommendations:  - NPO - To pre-op/OR today - Do not submerge    Signed,  Dulcy Fanny. Earleen Newport, DO

## 2018-12-24 NOTE — Transfer of Care (Signed)
Immediate Anesthesia Transfer of Care Note  Patient: Alyssa Pena  Procedure(s) Performed: NEPHROLITHOTOMY PERCUTANEOUS (Right ) URETEROSCOPY (Right )  Patient Location: PACU  Anesthesia Type:General  Level of Consciousness: drowsy  Airway & Oxygen Therapy: Patient Spontanous Breathing and Patient connected to face mask oxygen  Post-op Assessment: Report given to RN and Post -op Vital signs reviewed and stable  Post vital signs: Reviewed and stable  Last Vitals:  Vitals Value Taken Time  BP 140/64 12/24/18 1556  Temp    Pulse 45 12/24/18 1558  Resp    SpO2 100 % 12/24/18 1558  Vitals shown include unvalidated device data.  Last Pain:  Vitals:   12/24/18 1233  TempSrc:   PainSc: 0-No pain         Complications: No apparent anesthesia complications

## 2018-12-24 NOTE — Anesthesia Procedure Notes (Signed)
Procedure Name: Intubation Performed by: Alvina Strother J, CRNA Pre-anesthesia Checklist: Patient identified, Emergency Drugs available, Suction available and Timeout performed Patient Re-evaluated:Patient Re-evaluated prior to induction Oxygen Delivery Method: Circle system utilized Preoxygenation: Pre-oxygenation with 100% oxygen Induction Type: IV induction Ventilation: Mask ventilation without difficulty Laryngoscope Size: Mac and 3 Grade View: Grade I Tube type: Oral Tube size: 7.0 mm Number of attempts: 1 Airway Equipment and Method: Stylet Placement Confirmation: ETT inserted through vocal cords under direct vision,  positive ETCO2 and breath sounds checked- equal and bilateral Secured at: 21 cm Tube secured with: Tape Dental Injury: Teeth and Oropharynx as per pre-operative assessment        

## 2018-12-24 NOTE — Anesthesia Postprocedure Evaluation (Signed)
Anesthesia Post Note  Patient: Alyssa Pena  Procedure(s) Performed: NEPHROLITHOTOMY PERCUTANEOUS (Right ) URETEROSCOPY (Right )     Patient location during evaluation: PACU Anesthesia Type: General Level of consciousness: responds to stimulation Pain management: pain level controlled Vital Signs Assessment: post-procedure vital signs reviewed and stable Respiratory status: spontaneous breathing, nonlabored ventilation, respiratory function stable and patient connected to nasal cannula oxygen Cardiovascular status: blood pressure returned to baseline and stable Postop Assessment: no apparent nausea or vomiting Anesthetic complications: no    Last Vitals:  Vitals:   12/24/18 1815 12/24/18 1836  BP: 127/60   Pulse: (!) 52   Resp:    Temp: 37 C 36.4 C  SpO2: 98%     Last Pain:  Vitals:   12/24/18 1836  TempSrc: Oral  PainSc:                  Karyl Kinnier Ellender

## 2018-12-24 NOTE — Discharge Instructions (Signed)

## 2018-12-24 NOTE — Op Note (Addendum)
Preoperative diagnosis: Right renal and right ureteral calculi  Postoperative diagnosis: Right renal and right ureteral calculi  Procedures: 1.  1st stage right percutaneous nephrostolithotomy (largest calculus 3 cm) 2.  Right antegrade ureteroscopy with stone removal 3.  Right ureteral stent removal  Surgeon: Pryor Curia MD  Anesthesia: General  EBL: 50 cc  Specimens: Right renal and ureteral calculi  Disposition of specimen: [facility]  Indication: Alyssa Pena is a 68 year old female who presented at the end of April with right ureteral obstruction due to large burden right ureteral and right renal calculi.  She underwent ureteral stent placement.  She wished to delay definitive treatment of her stones in order to decrease her risk for COVID-19.  She subsequently presented in a delayed fashion for definitive treatment.  After reviewing options, it was recommended that she proceed with the above procedures considering the very large burden of right renal and ureteral calculi.  The potential risks, complications, and the expected recovery process associated with the above procedures was discussed in detail.  Informed consent was obtained.  Description of procedure: The patient was taken to the operating room after percutaneous access had been obtained to a posterior lower pole calyx on the right kidney in interventional radiology by Dr. Earleen Newport.  She was placed in the prone position with care to pad all potential pressure points after general anesthesia was induced.  She was administered preoperative antibiotic therapy and prepped and draped in the usual sterile fashion.  A preoperative timeout was performed.  The indwelling ureteral catheter was identified and a Amplatz stiff guidewire was advanced into the bladder through the ureteral catheter and curled in the bladder under fluoroscopic guidance.  Of note, the proximal curl of the indwelling right ureteral stent  was noted to be within the proximal ureter.  The ureteral catheter was then removed and a dual-lumen catheter was placed over the initial guidewire.  A second Amplatz stiff guidewire was then advanced into the bladder again under fluoroscopic guidance and curled within the bladder.  Over the working wire, after a small incision in the right flank skin have been made, the nephrostomy balloon was inserted over the wire and positioned appropriately under fluoroscopic guidance with the tip just at the end of the lower pole calyx.  This was then inflated to 30 [place] with a pressure of 12 mmHg.  All waste was seen to exit the balloon.  The nephrostomy sheath was then inserted over the balloon and positioned at the edge of the lower pole calyx.  The balloon was then deflated and removed.  Using the rigid nephroscope, the renal collecting system was examined.  A large amount of stone was immediately identified.  Initially, fragments of this stone were removed with a 3 prong grasper.  Subsequently, the remaining renal calculi were removed using ultrasonic energy with the trilogy probe.  Once all renal calculi were removed, the renal collecting system was then carefully examined.  The proximal curl of the ureteral stent could not be identified.  No remaining stone was identified.  The flexible nephroscope was then used to examine the renal collecting system and again no remaining stone was identified within the renal collecting system.  Attention then turned to the ureter.  Before removal of the flexible nephroscope, a 0.38 Glidewire was inserted down the proximal ureter.  The digital flexible ureteroscope was then advanced over this wire into the proximal ureter.  A large amount of stone was identified first in the proximal  ureter and the proximal curl of the ureteral stent was identified.  Using an N gage basket, the ureteral stent was able to be removed.  The ureteroscope was able to be manipulated back into the  proximal ureter and a wire was again placed down the right ureter.  A ureteral access sheath was then advanced over this wire into the proximal ureter above the level of the calculi.  The ureteroscope was then advanced through the access sheath and multiple stone fragments were removed with the basket.  After the proximal stones were removed, the ureteroscope was able to be advanced further down the ureter into the mid/distal ureter.  Again, a large burden of stone was identified and these fragments were carefully removed with the basket.  Eventually, I was able to manipulate the flexible ureteroscope all the way into the bladder.  Most of the stone was very friable and no significant fragments were remaining although some of the friable stone material was still present.  At this point, it was decided to in the procedure with plans to proceed with a second stage procedure after further CT imaging.  The initial Kumpe catheter that had been placed was now inserted over the working wire and positioned distally in the bladder.  A 22 [place] council tip catheter was advanced over the wire into the renal collecting system and positioned appropriately.  Contrast was injected and demonstrated no evidence of extravasation.  The nephrostomy sheath was then carefully removed in both the nephrostomy tube and the ureteral catheter were secured at the skin level with 2-0 silk sutures.  A sterile dressing was applied.  The patient tolerated the procedure well and without complications.  She was able to be extubated and transferred to the recovery unit in satisfactory condition.

## 2018-12-24 NOTE — Anesthesia Preprocedure Evaluation (Signed)
Anesthesia Evaluation  Patient identified by MRN, date of birth, ID band Patient awake    Reviewed: Allergy & Precautions, NPO status , Patient's Chart, lab work & pertinent test results  Airway Mallampati: I  TM Distance: >3 FB Neck ROM: Full    Dental  (+) Upper Dentures   Pulmonary former smoker,    Pulmonary exam normal breath sounds clear to auscultation       Cardiovascular hypertension, Pt. on home beta blockers +CHF  Normal cardiovascular exam Rhythm:Regular Rate:Normal     Neuro/Psych PSYCHIATRIC DISORDERS Anxiety Depression negative neurological ROS     GI/Hepatic negative GI ROS, (+) Cirrhosis     substance abuse  , Hepatitis -, CPortal venous hypertension    Endo/Other  negative endocrine ROS  Renal/GU negative Renal ROS     Musculoskeletal negative musculoskeletal ROS (+)   Abdominal   Peds  Hematology Thrombocytopenia    Anesthesia Other Findings RIGHT RENAL AND URETERAL CALCULI  Reproductive/Obstetrics                             Anesthesia Physical Anesthesia Plan  ASA: IV  Anesthesia Plan: General   Post-op Pain Management:    Induction: Intravenous  PONV Risk Score and Plan: 3 and Ondansetron, Dexamethasone and Treatment may vary due to age or medical condition  Airway Management Planned: Oral ETT  Additional Equipment:   Intra-op Plan:   Post-operative Plan: Extubation in OR  Informed Consent: I have reviewed the patients History and Physical, chart, labs and discussed the procedure including the risks, benefits and alternatives for the proposed anesthesia with the patient or authorized representative who has indicated his/her understanding and acceptance.     Dental advisory given  Plan Discussed with: CRNA  Anesthesia Plan Comments: (Potential arterial line placement discussed )       Anesthesia Quick Evaluation

## 2018-12-24 NOTE — H&P (Signed)
Chief Complaint: Patient was seen in consultation today for right renal calculi/right nephrostomy tube placement.  Referring Physician(s): Borden,Lester  Supervising Physician: Corrie Mckusick  Patient Status: [facility] - Out-pt  History of Present Illness: Alyssa Pena is a 68 y.o. female with a past medical history of hypertension, thrombocytopenia, HF, portal venous hypertension, cirrhosis, alcoholic hepatitis, nephrolithiasis, arthritis, anxiety, depression, and alcohol abuse. She has a long history of nephrolithiasis. Most recently, was found to have large right-sided obstructing ureteral/renal stones. She underwent cytoscopy with right retrograde pyelogram with right ureteral stent placement in OR 10/17/2018 by Dr. Louis Meckel. She was doing well, until early June of this year when she developed severe right-sided flank pain that brought her to ED 11/19/2018. She was found to have right hydronephrosis. She saw her urologist who recommended IR consultation for possible right nephrostomy tube placement for access prior to OR with Dr. Alinda Money.  IR requested by Dr. Alinda Money for possible image-guided percutaneous right nephrostomy tube placement. Patient awake and alert laying in bed. Complains of right abdominal/flank pain, rated 5/10 at this time. Denies fever, chills, chest pain, dyspnea, or headache.   Past Medical History:  Diagnosis Date  . Alcohol dependence ([place]) 08/10/2013  . Alcoholic hepatitis 7/57/9728  . Anxiety   . Arthritis   . CHF (congestive heart failure) ([place])   . Cirrhosis ([place]) 08/10/2013  . Depression   . Dysrhythmia    before medication  . Fluid retention   . Full dentures    upper  . Hypertension   . Portal venous hypertension ([place]) 08/10/2013  . Renal disorder   . Thrombocytopenia ([place]) 08/10/2013    Past Surgical History:  Procedure Laterality Date  . ABDOMINAL HYSTERECTOMY    . BREAST SURGERY     lumpectomy benign  . CATARACT EXTRACTION W/PHACO Left 08/30/2016   Procedure: CATARACT EXTRACTION PHACOEMULSIFICATION AND INTRAOCULAR LENS PLACEMENT LEFT EYE CDE - 46.93;  Surgeon: Baruch Goldmann, MD;  Location: [facility];  Service: Ophthalmology;  Laterality: Left;  left  . CATARACT EXTRACTION W/PHACO Right 06/20/2017   Procedure: CATARACT EXTRACTION PHACO AND INTRAOCULAR LENS PLACEMENT RIGHT EYE;  Surgeon: Baruch Goldmann, MD;  Location: [facility];  Service: Ophthalmology;  Laterality: Right;  right  . CYSTOSCOPY WITH RETROGRADE PYELOGRAM, URETEROSCOPY AND STENT PLACEMENT Right 10/17/2018   Procedure: CYSTOSCOPY WITH RETROGRADE PYELOGRAM, URETEROSCOPY AND STENT PLACEMENT;  Surgeon: Ardis Hughs, MD;  Location: [facility];  Service: Urology;  Laterality: Right;  . KIDNEY STONE SURGERY      Allergies: Penicillins and Tape  Medications: Prior to Admission medications   Medication Sig Start Date End Date Taking? Authorizing Provider  carvedilol (COREG) 6.25 MG tablet Take 6.25 mg by mouth 2 (two) times daily. 04/21/18   [provider]  FLUoxetine (PROZAC) 20 MG capsule Take 20 mg by mouth daily.    [provider]  guaiFENesin (ROBITUSSIN) 100 MG/5ML SOLN Take 5 mLs (100 mg total) by mouth every 6 (six) hours as needed for cough or to loosen phlegm. Patient not taking: Reported on 12/18/2018 10/18/18   Barton Dubois, MD  LORazepam (ATIVAN) 1 MG tablet Take 2 mg by mouth at bedtime.     [provider]  meclizine (ANTIVERT) 25 MG tablet Take 25 mg by mouth 3 (three) times daily as needed for dizziness.    [provider]  Menthol-Methyl Salicylate (MUSCLE RUB EX) Apply 1 application topically 2 (two) times daily as needed (back pain).    [provider]  ondansetron [facility]) 4  MG tablet Take 1 tablet (4 mg total) by mouth every 6 (six) hours as needed. 07/25/29   Delora Fuel, MD  oxyCODONE (ROXICODONE) 5 MG immediate release tablet Take 1-2 tablets (5-10 mg total) by mouth every 4 (four) hours as needed for severe pain. Patient  not taking: Reported on 10/16/7614 0/7/37   Delora Fuel, MD     Family History  Problem Relation Age of Onset  . Lung cancer Father   . Lung cancer Brother   . COPD Brother   . Dementia Mother   . Arthritis Sister   . High blood pressure Sister   . COPD Sister     Social History   Socioeconomic History  . Marital status: Widowed    Spouse name: Not on file  . Number of children: Not on file  . Years of education: Not on file  . Highest education level: Not on file  Occupational History  . Not on file  Social Needs  . Financial resource strain: Not on file  . Food insecurity    Worry: Not on file    Inability: Not on file  . Transportation needs    Medical: Not on file    Non-medical: Not on file  Tobacco Use  . Smoking status: Former Smoker    Types: Cigarettes  . Smokeless tobacco: Never Used  Substance and Sexual Activity  . Alcohol use: Not Currently    Comment: daily  . Drug use: Not Currently    Comment: younger years  . Sexual activity: Not Currently    Birth control/protection: Surgical  Lifestyle  . Physical activity    Days per week: Not on file    Minutes per session: Not on file  . Stress: Not on file  Relationships  . Social Herbalist on phone: Not on file    Gets together: Not on file    Attends religious service: Not on file    Active member of club or organization: Not on file    Attends meetings of clubs or organizations: Not on file    Relationship status: Not on file  Other Topics Concern  . Not on file  Social History Narrative  . Not on file     Review of Systems: A 12 point ROS discussed and pertinent positives are indicated in the HPI above.  All other systems are negative.  Review of Systems  Constitutional: Negative for chills and fever.  Respiratory: Negative for shortness of breath and wheezing.   Cardiovascular: Negative for chest pain and palpitations.  Gastrointestinal: Positive for abdominal pain.   Genitourinary: Positive for flank pain.  Neurological: Negative for headaches.  Psychiatric/Behavioral: Negative for behavioral problems and confusion.    Vital Signs: There were no vitals taken for this visit.  Physical Exam Vitals signs and nursing note reviewed.  Constitutional:      General: She is not in acute distress.    Appearance: Normal appearance.  Cardiovascular:     Rate and Rhythm: Normal rate and regular rhythm.     Heart sounds: Normal heart sounds. No murmur.  Pulmonary:     Effort: Pulmonary effort is normal. No respiratory distress.     Breath sounds: Normal breath sounds. No wheezing.  Skin:    General: Skin is warm and dry.  Neurological:     Mental Status: She is alert and oriented to person, place, and time.  Psychiatric:        Mood and Affect: Mood  normal.        Behavior: Behavior normal.        Thought Content: Thought content normal.        Judgment: Judgment normal.      MD Evaluation Airway: WNL Heart: WNL Abdomen: WNL Chest/ Lungs: WNL ASA  Classification: 3 Mallampati/Airway Score: Two   Imaging: No results found.  Labs:  CBC: Recent Labs    10/15/18 2355 10/16/18 0555 11/19/18 0508 12/24/18 0830  WBC 9.8 8.2 6.8 6.6  HGB 12.5 11.6* 12.8 12.5  HCT 35.9* 35.2* 38.6 36.5  PLT 74* 65* 85* 81*    COAGS: Recent Labs    12/24/18 0830  INR 1.1    BMP: Recent Labs    10/17/18 1502 10/18/18 0813 11/19/18 0508 12/24/18 0830  NA 139 139 140 138  K 4.2 3.9 3.4* 3.6  CL 112* 111 106 107  CO2 21* 22 23 24   GLUCOSE 93 84 108* 94  BUN 15 17 12 13   CALCIUM 8.5* 8.5* 8.8* 8.9  CREATININE 1.02* 0.93 1.02* 0.85  GFRNONAA 57* >60 57* >60  GFRAA >60 >60 >60 >60    LIVER FUNCTION TESTS: Recent Labs    10/16/18 0555 12/24/18 0830  BILITOT 1.9* 2.0*  AST 25 27  ALT 16 17  ALKPHOS 60 65  PROT 6.1* 8.1  ALBUMIN 2.5* 2.9*     Assessment and Plan:  Right renal calculi. Plan for image-guided percutaneous right  nephrostomy tube placement today with Dr. Earleen Newport. Patient to go to OR with Dr. Alinda Money following procedure. Patient is NPO. Afebrile and WBCs WNL. She does not take blood thinners. INR 1.1 today.  Risks and benefits discussed with the patient including bleeding, infection, damage to adjacent structures, and sepsis. All of the patient's questions were answered, patient is agreeable to proceed. Consent signed and in chart.   Thank you for this interesting consult.  I greatly enjoyed meeting Alyssa Pena and look forward to participating in their care.  A copy of this report was sent to the requesting provider on this date.  Electronically Signed: Earley Abide, PA-C 12/24/2018, 9:22 AM   I spent a total of 40 Minutes in face to face in clinical consultation, greater than 50% of which was counseling/coordinating care for right renal calculi/right nephrostomy tube placement.

## 2018-12-24 NOTE — Progress Notes (Addendum)
Patient arrived from PACU via stretcher. Arouses to voice/touch, opens eyes completely and then drifts back to sleep. Groaned and responded to being pulled over to hospital bed. All tubes/drains intact. Will monitor patient closely. Patient on continuous pulse ox.

## 2018-12-25 ENCOUNTER — Encounter (HOSPITAL_COMMUNITY): Payer: Self-pay | Admitting: Urology

## 2018-12-25 DIAGNOSIS — I110 Hypertensive heart disease with heart failure: Secondary | ICD-10-CM | POA: Diagnosis present

## 2018-12-25 DIAGNOSIS — F329 Major depressive disorder, single episode, unspecified: Secondary | ICD-10-CM | POA: Diagnosis present

## 2018-12-25 DIAGNOSIS — M1990 Unspecified osteoarthritis, unspecified site: Secondary | ICD-10-CM | POA: Diagnosis present

## 2018-12-25 DIAGNOSIS — Z8249 Family history of ischemic heart disease and other diseases of the circulatory system: Secondary | ICD-10-CM | POA: Diagnosis not present

## 2018-12-25 DIAGNOSIS — E785 Hyperlipidemia, unspecified: Secondary | ICD-10-CM | POA: Diagnosis present

## 2018-12-25 DIAGNOSIS — K7030 Alcoholic cirrhosis of liver without ascites: Secondary | ICD-10-CM | POA: Diagnosis present

## 2018-12-25 DIAGNOSIS — K766 Portal hypertension: Secondary | ICD-10-CM | POA: Diagnosis present

## 2018-12-25 DIAGNOSIS — I509 Heart failure, unspecified: Secondary | ICD-10-CM | POA: Diagnosis present

## 2018-12-25 DIAGNOSIS — Z818 Family history of other mental and behavioral disorders: Secondary | ICD-10-CM | POA: Diagnosis not present

## 2018-12-25 DIAGNOSIS — N132 Hydronephrosis with renal and ureteral calculous obstruction: Secondary | ICD-10-CM | POA: Diagnosis present

## 2018-12-25 DIAGNOSIS — F419 Anxiety disorder, unspecified: Secondary | ICD-10-CM | POA: Diagnosis present

## 2018-12-25 DIAGNOSIS — Z87891 Personal history of nicotine dependence: Secondary | ICD-10-CM | POA: Diagnosis not present

## 2018-12-25 DIAGNOSIS — Z801 Family history of malignant neoplasm of trachea, bronchus and lung: Secondary | ICD-10-CM | POA: Diagnosis not present

## 2018-12-25 DIAGNOSIS — Z880 Allergy status to penicillin: Secondary | ICD-10-CM | POA: Diagnosis not present

## 2018-12-25 DIAGNOSIS — N200 Calculus of kidney: Secondary | ICD-10-CM | POA: Diagnosis not present

## 2018-12-25 DIAGNOSIS — Z1159 Encounter for screening for other viral diseases: Secondary | ICD-10-CM | POA: Diagnosis not present

## 2018-12-25 DIAGNOSIS — Z888 Allergy status to other drugs, medicaments and biological substances status: Secondary | ICD-10-CM | POA: Diagnosis not present

## 2018-12-25 LAB — CBC
HCT: 34.1 % — ABNORMAL LOW (ref 36.0–46.0)
Hemoglobin: 11.4 g/dL — ABNORMAL LOW (ref 12.0–15.0)
MCH: 32.4 pg (ref 26.0–34.0)
MCHC: 33.4 g/dL (ref 30.0–36.0)
MCV: 96.9 fL (ref 80.0–100.0)
Platelets: 65 10*3/uL — ABNORMAL LOW (ref 150–400)
RBC: 3.52 MIL/uL — ABNORMAL LOW (ref 3.87–5.11)
RDW: 14.4 % (ref 11.5–15.5)
WBC: 11.2 10*3/uL — ABNORMAL HIGH (ref 4.0–10.5)
nRBC: 0 % (ref 0.0–0.2)

## 2018-12-25 LAB — BASIC METABOLIC PANEL
Anion gap: 8 (ref 5–15)
BUN: 17 mg/dL (ref 8–23)
CO2: 23 mmol/L (ref 22–32)
Calcium: 8.5 mg/dL — ABNORMAL LOW (ref 8.9–10.3)
Chloride: 108 mmol/L (ref 98–111)
Creatinine, Ser: 1.03 mg/dL — ABNORMAL HIGH (ref 0.44–1.00)
GFR calc Af Amer: >60 mL/min (ref >60)
GFR calc non Af Amer: 56 mL/min — ABNORMAL LOW (ref >60)
Glucose, Bld: 280 mg/dL — ABNORMAL HIGH (ref 70–99)
Potassium: 4.4 mmol/L (ref 3.5–5.1)
Sodium: 139 mmol/L (ref 135–145)

## 2018-12-25 MED ORDER — SODIUM CHLORIDE 0.9 % IV SOLN
INTRAVENOUS | Status: DC
  Administered 2018-12-25 – 2018-12-26 (×2): via INTRAVENOUS

## 2018-12-25 MED ORDER — ONDANSETRON HCL 4 MG PO TABS: 4.0000 mg | ORAL_TABLET | Freq: Three times a day (TID) | ORAL | Status: DC | PRN

## 2018-12-25 MED ORDER — TRAMADOL HCL 50 MG PO TABS: 50.0000 mg | ORAL_TABLET | Freq: Four times a day (QID) | ORAL | 0 refills | Status: DC | PRN

## 2018-12-25 MED ORDER — TRAMADOL HCL 50 MG PO TABS
50.0000 mg | ORAL_TABLET | Freq: Four times a day (QID) | ORAL | Status: DC | PRN
  Administered 2018-12-25: 50 mg via ORAL
  Administered 2018-12-25: 100 mg via ORAL
  Administered 2018-12-25: 50 mg via ORAL
  Administered 2018-12-26: 100 mg via ORAL
  Filled 2018-12-25 (×2): qty 2
  Filled 2018-12-25 (×2): qty 1

## 2018-12-25 NOTE — Progress Notes (Signed)
Patient ID: Alyssa Pena, female   DOB: 1950-06-20, 68 y.o.   MRN: [id number]  1 Day Post-Op Subjective: Doing ok.  Having pain but controlled with meds overnight.  CT scan last night.  Objective: Vital signs in last 24 hours: Temp:  [97.3 F (36.3 C)-98.6 F (37 C)] 97.8 F (36.6 C) (07/10 0608) Pulse Rate:  [48-107] 59 (07/10 0608) Resp:  [10-18] 18 (07/10 0608) BP: (108-173)/(52-133) 158/68 (07/10 0608) SpO2:  [95 %-100 %] 100 % (07/10 0383) Weight:  [81.6 kg] 81.6 kg (07/09 0851)  Intake/Output from previous day: 07/09 0701 - 07/10 0700 In: 3235 [P.O.:30; I.V.:3205] Out: 795 [Urine:695; Blood:100] Intake/Output this shift: No intake/output data recorded.  Physical Exam:  General: Alert and oriented CV: RRR Lungs: Clear Abdomen: Soft, ND Incisions: Ext: NT, No erythema  Lab Results: Recent Labs    12/24/18 0830 12/24/18 1627 12/25/18 0406  HGB 12.5 12.5 11.4*  HCT 36.5 36.8 34.1*   BMET Recent Labs    12/24/18 0830 12/25/18 0406  NA 138 139  K 3.6 4.4  CL 107 108  CO2 24 23  GLUCOSE 94 280*  BUN 13 17  CREATININE 0.85 1.03*  CALCIUM 8.9 8.5*     Studies/Results: CT with one tiny lower pole fragment in right kidney and some remaining stone in ureter as predicted.  Assessment/Plan: 1) Right renal and ureteral calculi: Based on CT, will remove nephrostomy tube but will leave nephroureteral catheter with plans to d/c home.  She is already scheduled for 2nd procedure and will plan to proceed ureteroscopically.  Will monitor this morning, SL IVF, and ambulate.  If doing well, will d/c home later this morning.   LOS: 0 days   Dutch Gray 12/25/2018, 7:23 AM

## 2018-12-25 NOTE — Progress Notes (Signed)
Patient ID: Alyssa Pena, female   DOB: [birth year], 68 y.o.   MRN: [id number]  1 Day Post-Op Subjective: She has tolerated her diet until this afternoon when she developed nausea.  She has continued having right flank pain and this has not been well controlled but she has been hesitant to take pain medication.  She has had severe itching with narcotic medications such as oxycodone and hydrocodone and does not wish to take these medications.  She has ambulated.  She has voided multiple times.  Objective: Vital signs in last 24 hours: Temp:  [97.6 F (36.4 C)-98.6 F (37 C)] 98.3 F (36.8 C) (07/10 1453) Pulse Rate:  [52-64] 64 (07/10 1453) Resp:  [12-20] 20 (07/10 1453) BP: (115-159)/(56-84) 151/66 (07/10 1453) SpO2:  [95 %-100 %] 99 % (07/10 1453)  Intake/Output from previous day: 07/09 0701 - 07/10 0700 In: 3235 [P.O.:30; I.V.:3205] Out: 795 [Urine:695; Blood:100] Intake/Output this shift: Total I/O In: -  Out: 225 [Urine:225]  Physical Exam:  General: Alert and oriented Abd: Dressing in place and dry  Lab Results: Recent Labs    12/24/18 0830 12/24/18 1627 12/25/18 0406  HGB 12.5 12.5 11.4*  HCT 36.5 36.8 34.1*   BMET Recent Labs    12/24/18 0830 12/25/18 0406  NA 138 139  K 3.6 4.4  CL 107 108  CO2 24 23  GLUCOSE 94 280*  BUN 13 17  CREATININE 0.85 1.03*  CALCIUM 8.9 8.5*     Studies/Results:   Assessment/Plan: Right renal and ureteral calculi:  She is not ready for discharge due to persistent nausea/vomiting and poor pain management.  She did tolerate tramadol and I have encouraged her to use this medication for her pain.  Will restart IVF overnight and reassess for discharge in the morning.   LOS: 0 days   Dutch Gray 12/25/2018, 5:14 PM

## 2018-12-25 NOTE — Progress Notes (Addendum)
Patient c/o 8/10 after taking Tramadol 50 mg.  Patient does not want to take more than 50 mg because she states she, "Doesn't want to feel loopy."  Patients reports she is in too much pain to ambulate at this time.  Patient does report nausea.  Zofran IV given.  Dr. Alinda Money paged.    Dr. Alinda Money returned page.  Patient agreed to take Tramadol 50 mg dose.  Dr. Alinda Money to see patient.

## 2018-12-25 NOTE — Progress Notes (Signed)
Patient has voided three times since foley catheter removed.  Patient has tolerated regular diet.  Patient complaining of back pain after taking Tylenol earlier.  Dr. Alinda Money paged and notified.  Orders to follow.

## 2018-12-25 NOTE — Discharge Summary (Signed)
  Date of admission: 12/24/2018  Date of discharge: 12/26/18  Admission diagnosis: Right renal and ureteral calculi  Discharge diagnosis: Right renal and ureteral calculi  Secondary diagnoses: Hyperlipidemia and hypertension  History and Physical: For full details, please see admission history and physical. Briefly, Alyssa Pena is a 68 y.o. year old patient with large burden right renal and ureteral calculi.   Hospital Course: She presented to the hospital and was taken to the interventional radiology suite and underwent right renal access placement per interventional radiology.  She was then taken to the operating room and underwent right percutaneous nephrostolithotomy.  All visible stone was cleared from her right renal collecting system.  She also underwent antegrade ureteroscopy and a large burden of the right ureteral stones were also removed in this fashion.  She tolerated the procedure well without complications.  Postoperatively, a CT scan was performed that demonstrated residual ureteral calculi as expected.  She had only 1 very small stone fragment in the lower pole of the right renal collecting system.  It was felt that her nephrostomy tube could be removed on postoperative day 1 and she remained hemodynamically stable.  Further treatment of her ureteral stones were planned to be treated in a retrograde ureteroscopic fashion at her second stage procedure.  She began ambulating but did have significant nausea with pain medication.  Her pain was not well controlled.  She has had significant difficulty with hydrocodone and oxycodone due to severe itching.  After trials of different pain regimens, she was able to tolerate tramadol well.  She was evaluated on the afternoon postoperative day 1 and was still having significant nausea and her pain was not yet under control.  She was therefore placed as an inpatient admission and observed over night.  By the morning of postoperative day 2, she was  ambulating better, tolerating oral intake, and her pain was significantly improved with oral tramadol.  She was felt stable for discharge home.  Laboratory values:  Recent Labs    12/24/18 0830 12/24/18 1627 12/25/18 0406  HGB 12.5 12.5 11.4*  HCT 36.5 36.8 34.1*   Recent Labs    12/24/18 0830 12/25/18 0406  CREATININE 0.85 1.03*    Disposition: Home  Discharge instruction: The patient was instructed to be ambulatory but told to refrain from heavy lifting, strenuous activity, or driving.  Discharge medications:    Followup:  Follow-up Information    Raynelle Bring, MD.   Specialty: Urology Why: Next surgery as already scheduled. Contact information: [address] [place] [address] [phone]

## 2018-12-26 NOTE — Progress Notes (Addendum)
Patient's IV removed.  Site WNL.  AVS reviewed with patient and  patient's sister over the phone per the patient's request.  Patient's sister verbalized understanding of discharge instructions.  Patient transported by NT via wheelchair to main entrance at discharge.  Patient stable at time of discharge.

## 2018-12-29 ENCOUNTER — Other Ambulatory Visit: Payer: Self-pay | Admitting: *Deleted

## 2018-12-29 NOTE — Patient Outreach (Signed)
[facility]) Care Management  12/29/2018  Alyssa Pena 12-30-1950 [id number]   Subjective: Telephone call to patient's home  / mobile number, no answer, message states mailbox if full, and unable to accept messages.      Objective: Per KPN (Knowledge Performance Now, point of care tool) and chart review, patient hospitalized 12/24/2018 -12/26/2018 for Right renal and ureteral calculi, status post 1st stage right percutaneous nephrostolithotomy (largest calculus 3 cm), Right antegrade ureteroscopy with stone removal, Right ureteral stent removal on 12/24/2018.  Patient also has a history of hypertension and hyperlipidemia.      Assessment: Received Medicare EMMI General Discharge Red Flag Alert follow up referral on 12/29/2018.   Red Flag Triggers times 2, Day #1, patient answered yes to the following questions: Questions about discharge papers?  Unfilled prescriptions?    [facility] EMMI follow up pending patient contact.      Plan: RNCM will send unsuccessful outreach letter, [facility] pamphlet, handout: Know Before You Go, will call patient for 2nd telephone outreach attempt within 4 business days, [facility] EMMI follow up, and proceed with case closure, within 10 business days if no return call.     Debe Anfinson H. Annia Friendly, BSN, [place] Management [facility] Telephonic CM Phone: [phone] Fax: [phone]

## 2018-12-30 ENCOUNTER — Other Ambulatory Visit: Payer: Self-pay | Admitting: *Deleted

## 2018-12-30 NOTE — Patient Instructions (Addendum)
YOU NEED TO HAVE A COVID 19 TEST ON 01-01-2019 AT 57 PM. THIS TEST MUST BE DONE BEFORE SURGERY, COME TO [place] ENTRANCE. ONCE YOUR COVID TEST IS COMPLETED, PLEASE BEGIN THE QUARANTINE INSTRUCTIONS AS OUTLINED IN YOUR HANDOUT.                Burna Mortimer    Your procedure is scheduled on: 01-04-2019  Report to [facility] Main  Entrance    Report to admitting at  930 AM      Call this number if you have problems the morning of surgery [phone]    Remember: Do not eat food or drink liquids :After Midnight. BRUSH YOUR TEETH MORNING OF SURGERY AND RINSE YOUR MOUTH OUT, NO CHEWING GUM CANDY OR MINTS.     Take these medicines the morning of surgery with A SIP OF WATER: carvedilol, fluoxetine, meclizine if needed                                You may not have any metal on your body including hair pins and              piercings  Do not wear jewelry, make-up, lotions, powders or perfumes, deodorant             Do not wear nail polish.  Do not shave  48 hours prior to surgery.                Do not bring valuables to the hospital. [place].  Contacts, dentures or bridgework may not be worn into surgery.  Leave suitcase in the car. After surgery it may be brought to your room.      _____________________________________________________________________             [facility] - Preparing for Surgery Before surgery, you can play an important role.  Because skin is not sterile, your skin needs to be as free of germs as possible.  You can reduce the number of germs on your skin by washing with CHG (chlorahexidine gluconate) soap before surgery.  CHG is an antiseptic cleaner which kills germs and bonds with the skin to continue killing germs even after washing. Please DO NOT use if you have an allergy to CHG or antibacterial soaps.  If your skin becomes reddened/irritated stop using the CHG and  inform your nurse when you arrive at Short Stay. Do not shave (including legs and underarms) for at least 48 hours prior to the first CHG shower.  You may shave your face/neck. Please follow these instructions carefully:  1.  Shower with CHG Soap the night before surgery and the  morning of Surgery.  2.  If you choose to wash your hair, wash your hair first as usual with your  normal  shampoo.  3.  After you shampoo, rinse your hair and body thoroughly to remove the  shampoo.                           4.  Use CHG as you would any other liquid soap.  You can apply chg directly  to the skin and wash  Gently with a scrungie or clean washcloth.  5.  Apply the CHG Soap to your body ONLY FROM THE NECK DOWN.   Do not use on face/ open                           Wound or open sores. Avoid contact with eyes, ears mouth and genitals (private parts).                       Wash face,  Genitals (private parts) with your normal soap.             6.  Wash thoroughly, paying special attention to the area where your surgery  will be performed.  7.  Thoroughly rinse your body with warm water from the neck down.  8.  DO NOT shower/wash with your normal soap after using and rinsing off  the CHG Soap.                9.  Pat yourself dry with a clean towel.            10.  Wear clean pajamas.            11.  Place clean sheets on your bed the night of your first shower and do not  sleep with pets. Day of Surgery : Do not apply any lotions/deodorants the morning of surgery.  Please wear clean clothes to the hospital/surgery center.  FAILURE TO FOLLOW THESE INSTRUCTIONS MAY RESULT IN THE CANCELLATION OF YOUR SURGERY PATIENT SIGNATURE_________________________________  NURSE SIGNATURE__________________________________  ________________________________________________________________________

## 2018-12-30 NOTE — Patient Outreach (Addendum)
[facility]) Care Management  12/30/2018  PEGGYANN ZWIEFELHOFER May 26, 1951 [id number]   Subjective: Telephone call to patient's home / mobile number, spoke with patient, verified her name, states she is doing ok, call disconnected.  Telephone call to patient's home  / mobile number, no answer, message states voicemail full, and unable to accept messages.     Objective: Per KPN (Knowledge Performance Now, point of care tool) and chart review, patient hospitalized 12/24/2018 -12/26/2018 for Right renal and ureteral calculi, status post 1st stage right percutaneous nephrostolithotomy(largest calculus 3 cm), Right antegrade ureteroscopy with stone removal, Right ureteral stent removal on 12/24/2018.  Patient also has a history of hypertension and hyperlipidemia.      Assessment: Received Medicare EMMI General Discharge Red Flag Alert follow up referral on 12/29/2018. Red Flag Triggers times 2, Day #1, patient answered yes to the following questions: Questions about discharge papers?  Unfilled prescriptions?    [facility] EMMI follow up pending patient contact.      Plan: RNCM has sent unsuccessful outreach letter, [facility] pamphlet, handout: Know Before You Go, will call patient for 3rd telephone outreach attempt within 4 business days, [facility] EMMI follow up, and proceed with case closure, within 10 business days if no return call.    Georgie Eduardo H. Annia Friendly, BSN, [place] Management [facility] Telephonic CM Phone: [phone] Fax: [phone]

## 2018-12-30 NOTE — Progress Notes (Signed)
EKG 12-24-18 EPIC

## 2018-12-31 ENCOUNTER — Other Ambulatory Visit: Payer: Self-pay | Admitting: *Deleted

## 2018-12-31 ENCOUNTER — Other Ambulatory Visit: Payer: Self-pay | Admitting: Urology

## 2018-12-31 NOTE — Patient Outreach (Signed)
[facility]) Care Management  12/31/2018  Alyssa Pena 26-Nov-1950 [id number]   Subjective:  Telephone call to patient's home  / mobile number, no answer, message states voicemail full, and unable to accept messages.     Objective:Per KPN (Knowledge Performance Now, point of care tool) and chart review,patient hospitalized 12/24/2018 -12/26/2018 forRight renal and ureteral calculi, status post1st stage right percutaneous nephrostolithotomy(largest calculus 3 cm),Right antegrade ureteroscopy with stone removal,Right ureteral stent removalon 12/24/2018. Patient also has a history of hypertension and hyperlipidemia.     Assessment: Received Medicare EMMI General Discharge Red Flag Alert follow up referral on 12/29/2018. Red Flag Triggers times 2, Day #1, patient answered yes to the following questions: Questions about discharge papers?Unfilled prescriptions?[facility] EMMI follow up pending patient contact.     Plan:RNCM has sent unsuccessful outreach letter, [facility] pamphlet, handout: Know Before You Go, and will proceed with case closure, within 10 business days if no return call.    Alyssa Pena, BSN, [place] [facility] Telephonic CM Phone: [phone] Fax: [phone]

## 2019-01-01 ENCOUNTER — Other Ambulatory Visit: Payer: Self-pay

## 2019-01-01 ENCOUNTER — Other Ambulatory Visit (HOSPITAL_COMMUNITY)
Admission: RE | Admit: 2019-01-01 | Discharge: 2019-01-01 | Disposition: A | Discharge status: Home / Self Care | Payer: Medicare Other | Source: Ambulatory Visit

## 2019-01-01 ENCOUNTER — Encounter (HOSPITAL_COMMUNITY): Payer: Self-pay

## 2019-01-01 ENCOUNTER — Encounter (HOSPITAL_COMMUNITY)
Admission: RE | Admit: 2019-01-01 | Discharge: 2019-01-01 | Disposition: A | Discharge status: Home / Self Care | Payer: Medicare Other | Source: Ambulatory Visit | Attending: Urology | Admitting: Urology

## 2019-01-01 DIAGNOSIS — F1020 Alcohol dependence, uncomplicated: Secondary | ICD-10-CM | POA: Diagnosis not present

## 2019-01-01 DIAGNOSIS — K766 Portal hypertension: Secondary | ICD-10-CM | POA: Diagnosis not present

## 2019-01-01 DIAGNOSIS — E785 Hyperlipidemia, unspecified: Secondary | ICD-10-CM | POA: Diagnosis not present

## 2019-01-01 DIAGNOSIS — Z01812 Encounter for preprocedural laboratory examination: Secondary | ICD-10-CM | POA: Insufficient documentation

## 2019-01-01 DIAGNOSIS — F329 Major depressive disorder, single episode, unspecified: Secondary | ICD-10-CM | POA: Diagnosis not present

## 2019-01-01 DIAGNOSIS — K7010 Alcoholic hepatitis without ascites: Secondary | ICD-10-CM | POA: Diagnosis not present

## 2019-01-01 DIAGNOSIS — I110 Hypertensive heart disease with heart failure: Secondary | ICD-10-CM | POA: Diagnosis not present

## 2019-01-01 DIAGNOSIS — Z1159 Encounter for screening for other viral diseases: Secondary | ICD-10-CM | POA: Insufficient documentation

## 2019-01-01 DIAGNOSIS — I868 Varicose veins of other specified sites: Secondary | ICD-10-CM | POA: Diagnosis not present

## 2019-01-01 DIAGNOSIS — N200 Calculus of kidney: Secondary | ICD-10-CM | POA: Insufficient documentation

## 2019-01-01 DIAGNOSIS — Z880 Allergy status to penicillin: Secondary | ICD-10-CM | POA: Diagnosis not present

## 2019-01-01 DIAGNOSIS — N132 Hydronephrosis with renal and ureteral calculous obstruction: Secondary | ICD-10-CM | POA: Diagnosis not present

## 2019-01-01 DIAGNOSIS — N201 Calculus of ureter: Secondary | ICD-10-CM | POA: Diagnosis present

## 2019-01-01 DIAGNOSIS — F419 Anxiety disorder, unspecified: Secondary | ICD-10-CM | POA: Diagnosis not present

## 2019-01-01 DIAGNOSIS — N211 Calculus in urethra: Secondary | ICD-10-CM | POA: Insufficient documentation

## 2019-01-01 DIAGNOSIS — K709 Alcoholic liver disease, unspecified: Secondary | ICD-10-CM | POA: Diagnosis not present

## 2019-01-01 DIAGNOSIS — Z79899 Other long term (current) drug therapy: Secondary | ICD-10-CM | POA: Diagnosis not present

## 2019-01-01 DIAGNOSIS — Z87891 Personal history of nicotine dependence: Secondary | ICD-10-CM | POA: Diagnosis not present

## 2019-01-01 DIAGNOSIS — I5030 Unspecified diastolic (congestive) heart failure: Secondary | ICD-10-CM | POA: Diagnosis not present

## 2019-01-01 HISTORY — DX: Other skin changes: R23.8

## 2019-01-01 HISTORY — DX: Spontaneous ecchymoses: R23.3

## 2019-01-01 LAB — BASIC METABOLIC PANEL
Anion gap: 8 (ref 5–15)
BUN: 10 mg/dL (ref 8–23)
CO2: 28 mmol/L (ref 22–32)
Calcium: 8.8 mg/dL — ABNORMAL LOW (ref 8.9–10.3)
Chloride: 107 mmol/L (ref 98–111)
Creatinine, Ser: 0.84 mg/dL (ref 0.44–1.00)
GFR calc Af Amer: >60 mL/min (ref >60)
GFR calc non Af Amer: >60 mL/min (ref >60)
Glucose, Bld: 109 mg/dL — ABNORMAL HIGH (ref 70–99)
Potassium: 3.5 mmol/L (ref 3.5–5.1)
Sodium: 143 mmol/L (ref 135–145)

## 2019-01-01 LAB — CBC
HCT: 34.6 % — ABNORMAL LOW (ref 36.0–46.0)
Hemoglobin: 11.5 g/dL — ABNORMAL LOW (ref 12.0–15.0)
MCH: 32.4 pg (ref 26.0–34.0)
MCHC: 33.2 g/dL (ref 30.0–36.0)
MCV: 97.5 fL (ref 80.0–100.0)
Platelets: 137 10*3/uL — ABNORMAL LOW (ref 150–400)
RBC: 3.55 MIL/uL — ABNORMAL LOW (ref 3.87–5.11)
RDW: 15.1 % (ref 11.5–15.5)
WBC: 7.4 10*3/uL (ref 4.0–10.5)
nRBC: 0 % (ref 0.0–0.2)

## 2019-01-01 LAB — SARS CORONAVIRUS 2 (TAT 6-24 HRS): SARS Coronavirus 2: NEGATIVE

## 2019-01-04 ENCOUNTER — Encounter (HOSPITAL_COMMUNITY)
Admission: RE | Disposition: A | Discharge status: Home / Self Care | Payer: Self-pay | Source: Home / Self Care | Attending: Urology

## 2019-01-04 ENCOUNTER — Ambulatory Visit (HOSPITAL_COMMUNITY)
Admission: RE | Admit: 2019-01-04 | Discharge: 2019-01-04 | Disposition: A | Discharge status: Home / Self Care | Payer: Medicare Other | Attending: Urology | Admitting: Urology

## 2019-01-04 ENCOUNTER — Ambulatory Visit (HOSPITAL_COMMUNITY): Payer: Medicare Other | Admitting: Anesthesiology

## 2019-01-04 ENCOUNTER — Encounter (HOSPITAL_COMMUNITY): Payer: Self-pay | Admitting: *Deleted

## 2019-01-04 ENCOUNTER — Other Ambulatory Visit: Payer: Self-pay

## 2019-01-04 ENCOUNTER — Ambulatory Visit (HOSPITAL_COMMUNITY): Payer: Medicare Other

## 2019-01-04 DIAGNOSIS — E785 Hyperlipidemia, unspecified: Secondary | ICD-10-CM | POA: Diagnosis not present

## 2019-01-04 DIAGNOSIS — D649 Anemia, unspecified: Secondary | ICD-10-CM | POA: Diagnosis not present

## 2019-01-04 DIAGNOSIS — K709 Alcoholic liver disease, unspecified: Secondary | ICD-10-CM | POA: Insufficient documentation

## 2019-01-04 DIAGNOSIS — N132 Hydronephrosis with renal and ureteral calculous obstruction: Secondary | ICD-10-CM | POA: Diagnosis not present

## 2019-01-04 DIAGNOSIS — Z880 Allergy status to penicillin: Secondary | ICD-10-CM | POA: Insufficient documentation

## 2019-01-04 DIAGNOSIS — Z87891 Personal history of nicotine dependence: Secondary | ICD-10-CM | POA: Insufficient documentation

## 2019-01-04 DIAGNOSIS — I340 Nonrheumatic mitral (valve) insufficiency: Secondary | ICD-10-CM | POA: Diagnosis not present

## 2019-01-04 DIAGNOSIS — F419 Anxiety disorder, unspecified: Secondary | ICD-10-CM | POA: Insufficient documentation

## 2019-01-04 DIAGNOSIS — I868 Varicose veins of other specified sites: Secondary | ICD-10-CM | POA: Insufficient documentation

## 2019-01-04 DIAGNOSIS — F1020 Alcohol dependence, uncomplicated: Secondary | ICD-10-CM | POA: Insufficient documentation

## 2019-01-04 DIAGNOSIS — I10 Essential (primary) hypertension: Secondary | ICD-10-CM | POA: Diagnosis not present

## 2019-01-04 DIAGNOSIS — I5030 Unspecified diastolic (congestive) heart failure: Secondary | ICD-10-CM | POA: Insufficient documentation

## 2019-01-04 DIAGNOSIS — I110 Hypertensive heart disease with heart failure: Secondary | ICD-10-CM | POA: Insufficient documentation

## 2019-01-04 DIAGNOSIS — F329 Major depressive disorder, single episode, unspecified: Secondary | ICD-10-CM | POA: Diagnosis not present

## 2019-01-04 DIAGNOSIS — Z1159 Encounter for screening for other viral diseases: Secondary | ICD-10-CM | POA: Insufficient documentation

## 2019-01-04 DIAGNOSIS — N202 Calculus of kidney with calculus of ureter: Secondary | ICD-10-CM | POA: Diagnosis not present

## 2019-01-04 DIAGNOSIS — K766 Portal hypertension: Secondary | ICD-10-CM | POA: Insufficient documentation

## 2019-01-04 DIAGNOSIS — Z79899 Other long term (current) drug therapy: Secondary | ICD-10-CM | POA: Insufficient documentation

## 2019-01-04 DIAGNOSIS — K7010 Alcoholic hepatitis without ascites: Secondary | ICD-10-CM | POA: Insufficient documentation

## 2019-01-04 HISTORY — PX: CYSTOSCOPY/URETEROSCOPY/HOLMIUM LASER/STENT PLACEMENT: SHX6546

## 2019-01-04 SURGERY — CYSTOSCOPY/URETEROSCOPY/HOLMIUM LASER/STENT PLACEMENT
Anesthesia: General

## 2019-01-04 MED ORDER — LACTATED RINGERS IV SOLN
INTRAVENOUS | Status: DC
  Administered 2019-01-04 (×2): via INTRAVENOUS

## 2019-01-04 MED ORDER — KETOROLAC TROMETHAMINE 30 MG/ML IJ SOLN: 30.0000 mg | Freq: Once | INTRAMUSCULAR | Status: DC | PRN

## 2019-01-04 MED ORDER — PROPOFOL 10 MG/ML IV BOLUS
INTRAVENOUS | Status: AC
  Filled 2019-01-04: qty 20

## 2019-01-04 MED ORDER — FENTANYL CITRATE (PF) 100 MCG/2ML IJ SOLN
INTRAMUSCULAR | Status: AC
  Filled 2019-01-04: qty 2

## 2019-01-04 MED ORDER — FENTANYL CITRATE (PF) 100 MCG/2ML IJ SOLN: 25.0000 ug | INTRAMUSCULAR | Status: DC | PRN

## 2019-01-04 MED ORDER — IOHEXOL 300 MG/ML  SOLN
INTRAMUSCULAR | Status: DC | PRN
  Administered 2019-01-04: 5 mL

## 2019-01-04 MED ORDER — PROMETHAZINE HCL 25 MG/ML IJ SOLN: 6.2500 mg | INTRAMUSCULAR | Status: DC | PRN

## 2019-01-04 MED ORDER — LIDOCAINE 2% (20 MG/ML) 5 ML SYRINGE
INTRAMUSCULAR | Status: DC | PRN
  Administered 2019-01-04: 50 mg via INTRAVENOUS

## 2019-01-04 MED ORDER — SODIUM CHLORIDE 0.9 % IR SOLN
Status: DC | PRN
  Administered 2019-01-04: 3000 mL

## 2019-01-04 MED ORDER — ONDANSETRON HCL 4 MG/2ML IJ SOLN
INTRAMUSCULAR | Status: DC | PRN
  Administered 2019-01-04: 4 mg via INTRAVENOUS

## 2019-01-04 MED ORDER — SODIUM CHLORIDE 0.9 % IV SOLN
2.0000 g | Freq: Once | INTRAVENOUS | Status: AC
  Administered 2019-01-04: 2 g via INTRAVENOUS
  Filled 2019-01-04: qty 20

## 2019-01-04 MED ORDER — DEXAMETHASONE SODIUM PHOSPHATE 10 MG/ML IJ SOLN
INTRAMUSCULAR | Status: DC | PRN
  Administered 2019-01-04: 8 mg via INTRAVENOUS

## 2019-01-04 MED ORDER — FENTANYL CITRATE (PF) 100 MCG/2ML IJ SOLN
INTRAMUSCULAR | Status: DC | PRN
  Administered 2019-01-04: 25 ug via INTRAVENOUS

## 2019-01-04 MED ORDER — PROPOFOL 10 MG/ML IV BOLUS
INTRAVENOUS | Status: DC | PRN
  Administered 2019-01-04: 100 mg via INTRAVENOUS

## 2019-01-04 MED ORDER — MIDAZOLAM HCL 2 MG/2ML IJ SOLN
INTRAMUSCULAR | Status: AC
  Filled 2019-01-04: qty 2

## 2019-01-04 SURGICAL SUPPLY — 23 items
BAG URO CATCHER STRL LF (MISCELLANEOUS) ×3 IMPLANT
BASKET ZERO TIP NITINOL 2.4FR (BASKET) IMPLANT
BSKT STON RTRVL ZERO TP 2.4FR (BASKET)
CATH INTERMIT  6FR 70CM (CATHETERS) ×2 IMPLANT
CLOTH BEACON ORANGE TIMEOUT ST (SAFETY) ×3 IMPLANT
COVER WAND RF STERILE (DRAPES) IMPLANT
EXTRACTOR STONE NITINOL NGAGE (UROLOGICAL SUPPLIES) ×2 IMPLANT
FIBER LASER FLEXIVA 365 (UROLOGICAL SUPPLIES) IMPLANT
FIBER LASER TRAC TIP (UROLOGICAL SUPPLIES) IMPLANT
GLOVE BIOGEL M STRL SZ7.5 (GLOVE) ×3 IMPLANT
GOWN STRL REUS W/TWL LRG LVL3 (GOWN DISPOSABLE) ×6 IMPLANT
GUIDEWIRE ANG ZIPWIRE 038X150 (WIRE) IMPLANT
GUIDEWIRE STR DUAL SENSOR (WIRE) ×3 IMPLANT
IV NS 1000ML (IV SOLUTION) ×3
IV NS 1000ML BAXH (IV SOLUTION) ×1 IMPLANT
KIT TURNOVER KIT A (KITS) IMPLANT
MANIFOLD NEPTUNE II (INSTRUMENTS) ×3 IMPLANT
PACK CYSTO (CUSTOM PROCEDURE TRAY) ×3 IMPLANT
SHEATH URETERAL 12FRX35CM (MISCELLANEOUS) IMPLANT
STENT URET 6FRX24 CONTOUR (STENTS) ×2 IMPLANT
TUBING CONNECTING 10 (TUBING) ×2 IMPLANT
TUBING CONNECTING 10' (TUBING) ×1
TUBING UROLOGY SET (TUBING) ×3 IMPLANT

## 2019-01-04 NOTE — Interval H&P Note (Signed)
History and Physical Interval Note:  01/04/2019 11:05 AM  Alyssa Pena  has presented today for surgery, with the diagnosis of RIGHT RENAL AND URETERAL CALCULI.  The various methods of treatment have been discussed with the patient and family. After consideration of risks, benefits and other options for treatment, the patient has consented to  Procedure(s): CYSTOSCOPY/URETEROSCOPY/HOLMIUM LASER/STENT PLACEMENT (N/A) as a surgical intervention.  The patient's history has been reviewed, patient examined, no change in status, stable for surgery.  I have reviewed the patient's chart and labs.  Questions were answered to the patient's satisfaction.     [employer]

## 2019-01-04 NOTE — Anesthesia Preprocedure Evaluation (Addendum)
Anesthesia Evaluation  Patient identified by MRN, date of birth, ID band Patient awake    Reviewed: Allergy & Precautions, NPO status , Patient's Chart, lab work & pertinent test results  Airway Mallampati: II  TM Distance: >3 FB Neck ROM: Full    Dental no notable dental hx.    Pulmonary neg pulmonary ROS, former smoker,    Pulmonary exam normal breath sounds clear to auscultation       Cardiovascular hypertension, + Valvular Problems/Murmurs MR  Rhythm:Regular Rate:Normal + Systolic murmurs    Neuro/Psych negative neurological ROS  negative psych ROS   GI/Hepatic negative GI ROS, (+) Cirrhosis       , Hepatitis -  Endo/Other  negative endocrine ROS  Renal/GU negative Renal ROS  negative genitourinary   Musculoskeletal negative musculoskeletal ROS (+)   Abdominal   Peds negative pediatric ROS (+)  Hematology  (+) anemia , Thrombocytopenia secondary to cirrhosis   Anesthesia Other Findings   Reproductive/Obstetrics negative OB ROS                            Anesthesia Physical Anesthesia Plan  ASA: III  Anesthesia Plan: General   Post-op Pain Management:    Induction: Intravenous  PONV Risk Score and Plan: 3 and Ondansetron, Dexamethasone and Treatment may vary due to age or medical condition  Airway Management Planned: LMA  Additional Equipment:   Intra-op Plan:   Post-operative Plan: Extubation in OR  Informed Consent: I have reviewed the patients History and Physical, chart, labs and discussed the procedure including the risks, benefits and alternatives for the proposed anesthesia with the patient or authorized representative who has indicated his/her understanding and acceptance.     Dental advisory given  Plan Discussed with: CRNA and Surgeon  Anesthesia Plan Comments:         Anesthesia Quick Evaluation

## 2019-01-04 NOTE — Op Note (Signed)
Preoperative diagnosis: Right ureteral calculi  Postoperative diagnosis: Right ureteral calculi  Procedure:  1. Cystoscopy 2. Right ureteroscopy and stone removal 3. Right ureteral stent placement (6 x 24 - no string) 4. Right retrograde pyelography with interpretation  Surgeon: Pryor Curia. M.D.  Anesthesia: General  Complications: None  Intraoperative findings: Right retrograde pyelography demonstrated a dilated right renal collecting system with no filling defects.  EBL: Minimal   Indication: Alyssa Pena is a 68 y.o. year old patient with urolithiasis.  He presents today for a planned 2nd stage procedure for ureteroscopic management of his remaining right ureteral stones.  After reviewing the management options for treatment, the patient elected to proceed with the above surgical procedure(s). We have discussed the potential benefits and risks of the procedure, side effects of the proposed treatment, the likelihood of the patient achieving the goals of the procedure, and any potential problems that might occur during the procedure or recuperation. Informed consent has been obtained.  Description of procedure:  The patient was taken to the operating room and general anesthesia was induced.  The patient was placed in the dorsal lithotomy position, prepped and draped in the usual sterile fashion, and preoperative antibiotics were administered. A preoperative time-out was performed.   Cystourethroscopy was performed.  The patient's urethra was examined and was normal. The bladder was then systematically examined in its entirety. There was no evidence for any bladder tumors, stones, or other mucosal pathology.  The indwelling right nephroureteral catheter was not visualized.  A 0.38 sensor guidewire was then advanced up the right ureter into the renal pelvis under fluoroscopic guidance. The 6 Fr semirigid ureteroscope was then advanced into the ureter next to the guidewire and  the calculus was identified.  The nephroureteral catheter was carefully retracted from the flank.  All stones were then removed from the ureter with an N gage basket.  Reinspection of the ureter revealed no remaining visible stones or fragments.  The ureteroscope was advanced up to the renal collecting system.   Omnipaque contrast was injected through the scope and a retrograde pyelogram was performed with findings as dictated above.  The wire was then backloaded through the cystoscope and a ureteral stent was advance over the wire using Seldinger technique.  The stent was positioned appropriately under fluoroscopic and cystoscopic guidance.  The wire was then removed with an adequate stent curl noted in the renal pelvis as well as in the bladder.  The bladder was then emptied and the procedure ended.  The patient appeared to tolerate the procedure well and without complications.  The patient was able to be awakened and transferred to the recovery unit in satisfactory condition.

## 2019-01-04 NOTE — Anesthesia Procedure Notes (Signed)
Procedure Name: LMA Insertion Date/Time: 01/04/2019 12:01 PM Performed by: Anne Fu, CRNA Pre-anesthesia Checklist: Patient identified, Emergency Drugs available, Suction available, Patient being monitored and Timeout performed Patient Re-evaluated:Patient Re-evaluated prior to induction Oxygen Delivery Method: Circle system utilized Preoxygenation: Pre-oxygenation with 100% oxygen Induction Type: IV induction Ventilation: Mask ventilation without difficulty LMA: LMA inserted LMA Size: 4.0 Number of attempts: 1 Placement Confirmation: positive ETCO2 and breath sounds checked- equal and bilateral Tube secured with: Tape

## 2019-01-04 NOTE — Discharge Instructions (Signed)

## 2019-01-04 NOTE — Transfer of Care (Signed)
Immediate Anesthesia Transfer of Care Note  Patient: Alyssa Pena  Procedure(s) Performed: Procedure(s): CYSTOSCOPY/URETEROSCOPY/STENT PLACEMENT (N/A)  Patient Location: PACU  Anesthesia Type:General  Level of Consciousness:  sedated, patient cooperative and responds to stimulation  Airway & Oxygen Therapy:Patient Spontanous Breathing and Patient connected to face mask oxgen  Post-op Assessment:  Report given to PACU RN and Post -op Vital signs reviewed and stable  Post vital signs:  Reviewed and stable  Last Vitals:  Vitals:   01/04/19 0941  BP: 113/73  Pulse: (!) 54  Resp: 18  Temp: 36.8 C  SpO2: 02%    Complications: No apparent anesthesia complications

## 2019-01-05 ENCOUNTER — Encounter (HOSPITAL_COMMUNITY): Payer: Self-pay | Admitting: Urology

## 2019-01-05 NOTE — Anesthesia Postprocedure Evaluation (Signed)
Anesthesia Post Note  Patient: Alyssa Pena  Procedure(s) Performed: CYSTOSCOPY/URETEROSCOPY/STENT PLACEMENT (N/A )     Patient location during evaluation: PACU Anesthesia Type: General Level of consciousness: awake and alert Pain management: pain level controlled Vital Signs Assessment: post-procedure vital signs reviewed and stable Respiratory status: spontaneous breathing, nonlabored ventilation, respiratory function stable and patient connected to nasal cannula oxygen Cardiovascular status: blood pressure returned to baseline and stable Postop Assessment: no apparent nausea or vomiting Anesthetic complications: no    Last Vitals:  Vitals:   01/04/19 1330 01/04/19 1345  BP: 134/66 (!) 129/54  Pulse:  (!) 55  Resp: 15 14  Temp: 36.7 C 36.7 C  SpO2:  97%    Last Pain:  Vitals:   01/04/19 1345  TempSrc:   PainSc: 0-No pain                 Nelly Scriven S

## 2019-01-11 ENCOUNTER — Other Ambulatory Visit: Payer: Self-pay | Admitting: *Deleted

## 2019-01-11 NOTE — Patient Outreach (Signed)
[facility]) Care Management  01/11/2019  NITARA SZCZERBA 05-20-1951 [id number]   No response from patient outreach attempts will proceed with case closure.    Objective:Per KPN (Knowledge Performance Now, point of care tool) and chart review,patient hospitalized 12/24/2018 -12/26/2018 forRight renal and ureteral calculi, status post1st stage right percutaneous nephrostolithotomy(largest calculus 3 cm),Right antegrade ureteroscopy with stone removal,Right ureteral stent removalon 12/24/2018. Patient also has a history of hypertension and hyperlipidemia.     Assessment: Received Medicare EMMI General Discharge Red Flag Alert follow up referral on 12/29/2018. Red Flag Triggers times 2, Day #1, patient answered yes to the following questions: Questions about discharge papers?Unfilled prescriptions?EMMI follow up not completed due to unable to contact patient and will proceed with case closure.     Plan:Case closure due to unable to reach.      Zara Wendt H. Annia Friendly, BSN, [place] [facility] Telephonic CM Phone: [phone] Fax: [phone]

## 2019-01-26 DIAGNOSIS — N201 Calculus of ureter: Secondary | ICD-10-CM | POA: Diagnosis not present

## 2019-03-23 DIAGNOSIS — K2970 Gastritis, unspecified, without bleeding: Secondary | ICD-10-CM | POA: Diagnosis not present

## 2019-03-23 DIAGNOSIS — K7030 Alcoholic cirrhosis of liver without ascites: Secondary | ICD-10-CM | POA: Diagnosis not present

## 2019-03-23 DIAGNOSIS — I5032 Chronic diastolic (congestive) heart failure: Secondary | ICD-10-CM | POA: Diagnosis not present

## 2019-03-23 DIAGNOSIS — I10 Essential (primary) hypertension: Secondary | ICD-10-CM | POA: Diagnosis not present

## 2019-03-23 DIAGNOSIS — G4709 Other insomnia: Secondary | ICD-10-CM | POA: Diagnosis not present

## 2019-03-23 DIAGNOSIS — K219 Gastro-esophageal reflux disease without esophagitis: Secondary | ICD-10-CM | POA: Diagnosis not present

## 2019-04-08 ENCOUNTER — Telehealth: Payer: Self-pay | Admitting: Orthopaedic Surgery

## 2019-04-08 NOTE — Telephone Encounter (Signed)
Patient called, left message regarding appointment with Dr Luna Glasgow for new problem, neck pain; no treatment as of yet.

## 2019-04-14 NOTE — Telephone Encounter (Signed)
On 04/13/19, patient called back in response to message, and has been scheduled for appointment with Dr Luna Glasgow.

## 2019-04-20 ENCOUNTER — Ambulatory Visit (INDEPENDENT_AMBULATORY_CARE_PROVIDER_SITE_OTHER): Payer: Medicare Other | Admitting: Orthopaedic Surgery

## 2019-04-20 ENCOUNTER — Ambulatory Visit: Payer: Medicare Other

## 2019-04-20 ENCOUNTER — Encounter: Payer: Self-pay | Admitting: Orthopaedic Surgery

## 2019-04-20 ENCOUNTER — Other Ambulatory Visit: Payer: Self-pay

## 2019-04-20 VITALS — BP 157/85 | HR 55 | Temp 98.8°F | Ht 65.0 in | Wt 184.1 lb

## 2019-04-20 DIAGNOSIS — M542 Cervicalgia: Secondary | ICD-10-CM

## 2019-04-20 DIAGNOSIS — G8929 Other chronic pain: Secondary | ICD-10-CM | POA: Diagnosis not present

## 2019-04-20 MED ORDER — HYDROCODONE-ACETAMINOPHEN 5-325 MG PO TABS: ORAL_TABLET | ORAL | 0 refills | Status: DC

## 2019-04-20 MED ORDER — PREDNISONE 5 MG (21) PO TBPK: ORAL_TABLET | ORAL | 0 refills | Status: DC

## 2019-04-20 NOTE — Progress Notes (Signed)
Patient [id number] Alyssa Pena, female DOB:01-21-1951, 68 y.o. [id number]  Chief Complaint  Patient presents with  . Neck Pain    Bilat/hurting for 2 months/makes head hurt    HPI  Alyssa Pena is a 68 y.o. female who has developed neck pain over the last two months or so.  Her pain is central and goes down the back to mid upper back. She has no radiation to the hands or arms. She has tried rest, heat and ice with no help.  She is not getting any better. She has some headaches as well. She has no redness, no weakness.  She is tired of hurting.   Body mass index is 30.64 kg/m.  ROS  Review of Systems  Constitutional: Positive for activity change.  Musculoskeletal: Positive for arthralgias, gait problem and joint swelling.    All other systems reviewed and are negative.  The following is a summary of the past history medically, past history surgically, known current medicines, social history and family history.  This information is gathered electronically by the computer from prior information and documentation.  I review this each visit and have found including this information at this point in the chart is beneficial and informative.    Past Medical History:  Diagnosis Date  . Alcohol dependence ([place]) 08/10/2013  . Alcoholic hepatitis [id number]  . Anxiety   . Arthritis   . Bruises easily   . CHF (congestive heart failure) ([place])   . Cirrhosis ([place]) 08/10/2013  . Depression   . Dysrhythmia    before medication  . Fluid retention   . Full dentures    upper  . Hypertension   . Portal venous hypertension ([place]) 08/10/2013  . Renal disorder   . Thrombocytopenia ([place]) 08/10/2013    Past Surgical History:  Procedure Laterality Date  . ABDOMINAL HYSTERECTOMY    . BREAST SURGERY     lumpectomy benign  . CATARACT EXTRACTION W/PHACO Left 08/30/2016   Procedure: CATARACT EXTRACTION PHACOEMULSIFICATION AND INTRAOCULAR LENS PLACEMENT LEFT EYE CDE - 46.93;  Surgeon: Baruch Goldmann, MD;   Location: [facility];  Service: Ophthalmology;  Laterality: Left;  left  . CATARACT EXTRACTION W/PHACO Right 06/20/2017   Procedure: CATARACT EXTRACTION PHACO AND INTRAOCULAR LENS PLACEMENT RIGHT EYE;  Surgeon: Baruch Goldmann, MD;  Location: [facility];  Service: Ophthalmology;  Laterality: Right;  right  . CYSTOSCOPY WITH RETROGRADE PYELOGRAM, URETEROSCOPY AND STENT PLACEMENT Right 10/17/2018   Procedure: CYSTOSCOPY WITH RETROGRADE PYELOGRAM, URETEROSCOPY AND STENT PLACEMENT;  Surgeon: Ardis Hughs, MD;  Location: [facility];  Service: Urology;  Laterality: Right;  . CYSTOSCOPY/URETEROSCOPY/HOLMIUM LASER/STENT PLACEMENT N/A 01/04/2019   Procedure: CYSTOSCOPY/URETEROSCOPY/STENT PLACEMENT;  Surgeon: Raynelle Bring, MD;  Location: [facility];  Service: Urology;  Laterality: N/A;  . IR URETERAL STENT RIGHT NEW ACCESS W/O SEP NEPHROSTOMY CATH  12/24/2018  . KIDNEY STONE SURGERY    . NEPHROLITHOTOMY Right 12/24/2018   Procedure: NEPHROLITHOTOMY PERCUTANEOUS;  Surgeon: Raynelle Bring, MD;  Location: [facility];  Service: Urology;  Laterality: Right;  . URETEROSCOPY WITH HOLMIUM LASER LITHOTRIPSY Right 12/24/2018   Procedure: URETEROSCOPY;  Surgeon: Raynelle Bring, MD;  Location: [facility];  Service: Urology;  Laterality: Right;    Family History  Problem Relation Age of Onset  . Lung cancer Father   . Lung cancer Brother   . COPD Brother   . Dementia Mother   . Arthritis Sister   . High blood pressure Sister   . COPD Sister     Social History Social  History   Tobacco Use  . Smoking status: Former Smoker    Types: Cigarettes  . Smokeless tobacco: Never Used  Substance Use Topics  . Alcohol use: Not Currently  . Drug use: Not Currently    Comment: younger years    Allergies  Allergen Reactions  . Penicillins Hives and Other (See Comments)    Has patient had a PCN reaction causing immediate rash, facial/tongue/throat swelling, SOB or lightheadedness with hypotension: No Has patient had a PCN reaction causing  severe rash involving mucus membranes or skin necrosis: No Has patient had a PCN reaction that required hospitalization No Has patient had a PCN reaction occurring within the last 10 years: No If all of the above answers are "NO", then may proceed with Cephalosporin use.   . Tape     Bruises her skin    Current Outpatient Medications  Medication Sig Dispense Refill  . carvedilol (COREG) 6.25 MG tablet Take 6.25 mg by mouth 2 (two) times daily.    Marland Kitchen docusate sodium (COLACE) 100 MG capsule Take 1 capsule (100 mg total) by mouth 2 (two) times daily. 30 capsule 0  . FLUoxetine (PROZAC) 20 MG capsule Take 20 mg by mouth daily.    Marland Kitchen HYDROcodone-acetaminophen (NORCO/VICODIN) 5-325 MG tablet One tablet every four hours as needed for acute pain.  Limit of five days per [place]. 30 tablet 0  . LORazepam (ATIVAN) 1 MG tablet Take 2 mg by mouth at bedtime.     . meclizine (ANTIVERT) 25 MG tablet Take 25 mg by mouth 3 (three) times daily as needed for dizziness.    . Menthol-Methyl Salicylate (MUSCLE RUB EX) Apply 1 application topically 2 (two) times daily as needed (back pain).    . ondansetron (ZOFRAN) 4 MG tablet Take 1 tablet (4 mg total) by mouth every 6 (six) hours as needed. 12 tablet 0  . predniSONE (STERAPRED UNI-PAK 21 TAB) 5 MG (21) TBPK tablet Take 6 pills first day; 5 pills second day; 4 pills third day; 3 pills fourth day; 2 pills next day and 1 pill last day. 21 tablet 0  . traMADol (ULTRAM) 50 MG tablet Take 1-2 tablets (50-100 mg total) by mouth every 6 (six) hours as needed (moderate to severe pain). 20 tablet 0   No current facility-administered medications for this visit.      Physical Exam  Blood pressure (!) 157/85, pulse (!) 55, temperature 98.8 F (37.1 C), height 5\' 5"  (1.651 m), weight 184 lb 2 oz (83.5 kg).  Constitutional: overall normal hygiene, normal nutrition, well developed, normal grooming, normal body habitus. Assistive device:none  Musculoskeletal:  gait and station Limp none, muscle tone and strength are normal, no tremors or atrophy is present. Neck is sore but has fair ROM, limited to the left, no spasm.  Marland Kitchen  Neurological: coordination overall normal.  Deep tendon reflex/nerve stretch intact.  Sensation normal.  Cranial nerves II-XII intact.   Skin:   Normal overall no scars, lesions, ulcers or rashes. No psoriasis.  Psychiatric: Alert and oriented x 3.  Recent memory intact, remote memory unclear.  Normal mood and affect. Well groomed.  Good eye contact.  Cardiovascular: overall no swelling, no varicosities, no edema bilaterally, normal temperatures of the legs and arms, no clubbing, cyanosis and good capillary refill.  Lymphatic: palpation is normal.  All other systems reviewed and are negative   The patient has been educated about the nature of the problem(s) and counseled on treatment options.  The patient appeared to understand what I have discussed and is in agreement with it.  Encounter Diagnosis  Name Primary?  . Chronic neck pain Yes    PLAN Call if any problems.  Precautions discussed.  Continue current medications. Begin prednisone dose pack.  I have called in pain medicine.  I have reviewed the [place] web site prior to prescribing narcotic medicine for this patient.     Return to clinic 2 weeks   Electronically [place], MD 11/3/20204:06 PM

## 2019-05-04 ENCOUNTER — Ambulatory Visit: Payer: Medicare Other | Admitting: Orthopaedic Surgery

## 2019-06-23 DIAGNOSIS — I5032 Chronic diastolic (congestive) heart failure: Secondary | ICD-10-CM | POA: Diagnosis not present

## 2019-06-23 DIAGNOSIS — K219 Gastro-esophageal reflux disease without esophagitis: Secondary | ICD-10-CM | POA: Diagnosis not present

## 2019-06-23 DIAGNOSIS — I10 Essential (primary) hypertension: Secondary | ICD-10-CM | POA: Diagnosis not present

## 2019-06-23 DIAGNOSIS — G4709 Other insomnia: Secondary | ICD-10-CM | POA: Diagnosis not present

## 2019-06-23 DIAGNOSIS — K7030 Alcoholic cirrhosis of liver without ascites: Secondary | ICD-10-CM | POA: Diagnosis not present

## 2019-08-03 ENCOUNTER — Telehealth: Payer: Self-pay | Admitting: Radiology

## 2019-08-03 NOTE — Telephone Encounter (Signed)
Patient called, left a voice mail.  Did not say what she wanted.  I called her back, LM for her to call us.

## 2019-08-05 ENCOUNTER — Ambulatory Visit: Payer: Medicare Other | Admitting: Orthopaedic Surgery

## 2019-08-10 ENCOUNTER — Ambulatory Visit (INDEPENDENT_AMBULATORY_CARE_PROVIDER_SITE_OTHER): Payer: Medicare Other | Admitting: Orthopaedic Surgery

## 2019-08-10 ENCOUNTER — Encounter: Payer: Self-pay | Admitting: Orthopaedic Surgery

## 2019-08-10 ENCOUNTER — Other Ambulatory Visit: Payer: Self-pay

## 2019-08-10 DIAGNOSIS — M542 Cervicalgia: Secondary | ICD-10-CM

## 2019-08-10 MED ORDER — HYDROCODONE-ACETAMINOPHEN 5-325 MG PO TABS: ORAL_TABLET | ORAL | 0 refills | Status: DC

## 2019-08-10 NOTE — Progress Notes (Signed)
Patient Alyssa Pena, female DOB:Jun 08, 1951, 69 y.o. [id number]  Chief Complaint  Patient presents with  . Neck Pain    still hurting bad    HPI  Alyssa Pena is a 69 y.o. female who has chronic neck pain that has gotten worse.  She has pain moving to the left.  She has no spasm or weakness.  She is out of pain medicine.  She has no new trauma.   Body mass index is 31.85 kg/m.  ROS  Review of Systems  Constitutional: Positive for activity change.  Musculoskeletal: Positive for arthralgias, gait problem and joint swelling.    All other systems reviewed and are negative.  The following is a summary of the past history medically, past history surgically, known current medicines, social history and family history.  This information is gathered electronically by the computer from prior information and documentation.  I review this each visit and have found including this information at this point in the chart is beneficial and informative.    Past Medical History:  Diagnosis Date  . Alcohol dependence ([place]) 08/10/2013  . Alcoholic hepatitis [id number]  . Anxiety   . Arthritis   . Bruises easily   . CHF (congestive heart failure) ([place])   . Cirrhosis ([place]) 08/10/2013  . Depression   . Dysrhythmia    before medication  . Fluid retention   . Full dentures    upper  . Hypertension   . Portal venous hypertension ([place]) 08/10/2013  . Renal disorder   . Thrombocytopenia ([place]) 08/10/2013    Past Surgical History:  Procedure Laterality Date  . ABDOMINAL HYSTERECTOMY    . BREAST SURGERY     lumpectomy benign  . CATARACT EXTRACTION W/PHACO Left 08/30/2016   Procedure: CATARACT EXTRACTION PHACOEMULSIFICATION AND INTRAOCULAR LENS PLACEMENT LEFT EYE CDE - 46.93;  Surgeon: Baruch Goldmann, MD;  Location: [facility];  Service: Ophthalmology;  Laterality: Left;  left  . CATARACT EXTRACTION W/PHACO Right 06/20/2017   Procedure: CATARACT EXTRACTION PHACO AND INTRAOCULAR LENS PLACEMENT RIGHT  EYE;  Surgeon: Baruch Goldmann, MD;  Location: [facility];  Service: Ophthalmology;  Laterality: Right;  right  . CYSTOSCOPY WITH RETROGRADE PYELOGRAM, URETEROSCOPY AND STENT PLACEMENT Right 10/17/2018   Procedure: CYSTOSCOPY WITH RETROGRADE PYELOGRAM, URETEROSCOPY AND STENT PLACEMENT;  Surgeon: Ardis Hughs, MD;  Location: [facility];  Service: Urology;  Laterality: Right;  . CYSTOSCOPY/URETEROSCOPY/HOLMIUM LASER/STENT PLACEMENT N/A 01/04/2019   Procedure: CYSTOSCOPY/URETEROSCOPY/STENT PLACEMENT;  Surgeon: Raynelle Bring, MD;  Location: [facility];  Service: Urology;  Laterality: N/A;  . IR URETERAL STENT RIGHT NEW ACCESS W/O SEP NEPHROSTOMY CATH  12/24/2018  . KIDNEY STONE SURGERY    . NEPHROLITHOTOMY Right 12/24/2018   Procedure: NEPHROLITHOTOMY PERCUTANEOUS;  Surgeon: Raynelle Bring, MD;  Location: [facility];  Service: Urology;  Laterality: Right;  . URETEROSCOPY WITH HOLMIUM LASER LITHOTRIPSY Right 12/24/2018   Procedure: URETEROSCOPY;  Surgeon: Raynelle Bring, MD;  Location: [facility];  Service: Urology;  Laterality: Right;    Family History  Problem Relation Age of Onset  . Lung cancer Father   . Lung cancer Brother   . COPD Brother   . Dementia Mother   . Arthritis Sister   . High blood pressure Sister   . COPD Sister     Social History Social History   Tobacco Use  . Smoking status: Former Smoker    Types: Cigarettes  . Smokeless tobacco: Never Used  Substance Use Topics  . Alcohol use: Not Currently  . Drug use:  Not Currently    Comment: younger years    Allergies  Allergen Reactions  . Penicillins Hives and Other (See Comments)    Has patient had a PCN reaction causing immediate rash, facial/tongue/throat swelling, SOB or lightheadedness with hypotension: No Has patient had a PCN reaction causing severe rash involving mucus membranes or skin necrosis: No Has patient had a PCN reaction that required hospitalization No Has patient had a PCN reaction occurring within the last 10 years:  No If all of the above answers are "NO", then may proceed with Cephalosporin use.   . Tape     Bruises her skin    Current Outpatient Medications  Medication Sig Dispense Refill  . carvedilol (COREG) 6.25 MG tablet Take 6.25 mg by mouth 2 (two) times daily.    Marland Kitchen docusate sodium (COLACE) 100 MG capsule Take 1 capsule (100 mg total) by mouth 2 (two) times daily. (Patient not taking: Reported on 08/10/2019) 30 capsule 0  . FLUoxetine (PROZAC) 20 MG capsule Take 20 mg by mouth daily.    Marland Kitchen HYDROcodone-acetaminophen (NORCO/VICODIN) 5-325 MG tablet One tablet every four hours as needed for acute pain.  Limit of five days per [place] statue. (Patient not taking: Reported on 08/10/2019) 30 tablet 0  . LORazepam (ATIVAN) 1 MG tablet Take 2 mg by mouth at bedtime.     . meclizine (ANTIVERT) 25 MG tablet Take 25 mg by mouth 3 (three) times daily as needed for dizziness.    . Menthol-Methyl Salicylate (MUSCLE RUB EX) Apply 1 application topically 2 (two) times daily as needed (back pain).    . ondansetron (ZOFRAN) 4 MG tablet Take 1 tablet (4 mg total) by mouth every 6 (six) hours as needed. (Patient not taking: Reported on 08/10/2019) 12 tablet 0  . predniSONE (STERAPRED UNI-PAK 21 TAB) 5 MG (21) TBPK tablet Take 6 pills first day; 5 pills second day; 4 pills third day; 3 pills fourth day; 2 pills next day and 1 pill last day. (Patient not taking: Reported on 08/10/2019) 21 tablet 0  . traMADol (ULTRAM) 50 MG tablet Take 1-2 tablets (50-100 mg total) by mouth every 6 (six) hours as needed (moderate to severe pain). (Patient not taking: Reported on 08/10/2019) 20 tablet 0   No current facility-administered medications for this visit.     Physical Exam  Blood pressure (!) 166/88, pulse (!) 49, temperature 98.1 F (36.7 C), height 5\' 5"  (1.651 m), weight 191 lb 6 oz (86.8 kg).  Constitutional: overall normal hygiene, normal nutrition, well developed, normal grooming, normal body habitus. Assistive  device:none  Musculoskeletal: gait and station Limp none, muscle tone and strength are normal, no tremors or atrophy is present.  .  Neurological: coordination overall normal.  Deep tendon reflex/nerve stretch intact.  Sensation normal.  Cranial nerves II-XII intact.   Skin:   Normal overall no scars, lesions, ulcers or rashes. No psoriasis.  Psychiatric: Alert and oriented x 3.  Recent memory intact, remote memory unclear.  Normal mood and affect. Well groomed.  Good eye contact.  Cardiovascular: overall no swelling, no varicosities, no edema bilaterally, normal temperatures of the legs and arms, no clubbing, cyanosis and good capillary refill.  Lymphatic: palpation is normal.  All other systems reviewed and are negative   The patient has been educated about the nature of the problem(s) and counseled on treatment options.  The patient appeared to understand what I have discussed and is in agreement with it.  Encounter Diagnosis  Name  Primary?  . Cervicalgia     PLAN Call if any problems.  Precautions discussed.  Continue current medications.   Return to clinic 2 weeks   I have reviewed the [place] web site prior to prescribing narcotic medicine for this patient.   Get MRI of the cervical spine.  Return after MRI.  Electronically Signed Sanjuana Kava, MD 2/23/20212:32 PM

## 2019-08-24 ENCOUNTER — Ambulatory Visit: Payer: Medicare Other | Admitting: Orthopaedic Surgery

## 2019-08-24 ENCOUNTER — Telehealth: Payer: Self-pay | Admitting: Orthopaedic Surgery

## 2019-08-24 MED ORDER — HYDROCODONE-ACETAMINOPHEN 5-325 MG PO TABS: ORAL_TABLET | ORAL | 0 refills | Status: DC

## 2019-08-24 NOTE — Telephone Encounter (Signed)
Due to having to move MRI until the 17th and then having to reschedule return appointment until the 23rd, patient is requesting   Hydrocodone/Acetaminophen 5-.325 mgs.  Qty  30  Sig: One tablet every four hours as needed for acute pain. Limit of five days per [place] statue.       Patient states she uses [facility] in [place]

## 2019-08-27 DIAGNOSIS — N200 Calculus of kidney: Secondary | ICD-10-CM | POA: Diagnosis not present

## 2019-09-01 ENCOUNTER — Ambulatory Visit (HOSPITAL_COMMUNITY): Payer: Medicare Other

## 2019-09-06 ENCOUNTER — Telehealth: Payer: Self-pay

## 2019-09-06 NOTE — Telephone Encounter (Signed)
Hydrocodone-Acetaminophen 5/325 mg  Qty 28 Tablets   PATIENT USES [facility]

## 2019-09-06 NOTE — Telephone Encounter (Signed)
No more narcotics

## 2019-09-07 ENCOUNTER — Ambulatory Visit: Payer: Medicare Other | Admitting: Orthopaedic Surgery

## 2019-09-07 NOTE — Telephone Encounter (Signed)
Relayed message to patient

## 2019-09-11 DIAGNOSIS — N200 Calculus of kidney: Secondary | ICD-10-CM | POA: Diagnosis not present

## 2019-09-12 DIAGNOSIS — N200 Calculus of kidney: Secondary | ICD-10-CM | POA: Diagnosis not present

## 2019-09-23 ENCOUNTER — Encounter (HOSPITAL_COMMUNITY): Payer: Self-pay | Admitting: *Deleted

## 2019-09-23 ENCOUNTER — Emergency Department (HOSPITAL_COMMUNITY): Payer: Medicare Other

## 2019-09-23 ENCOUNTER — Emergency Department (HOSPITAL_COMMUNITY)
Admission: EM | Admit: 2019-09-23 | Discharge: 2019-09-23 | Disposition: A | Discharge status: Home / Self Care | Payer: Medicare Other | Attending: Emergency Medicine | Admitting: Emergency Medicine

## 2019-09-23 ENCOUNTER — Other Ambulatory Visit: Payer: Self-pay

## 2019-09-23 DIAGNOSIS — R0789 Other chest pain: Secondary | ICD-10-CM | POA: Diagnosis not present

## 2019-09-23 DIAGNOSIS — R079 Chest pain, unspecified: Secondary | ICD-10-CM | POA: Diagnosis not present

## 2019-09-23 DIAGNOSIS — I110 Hypertensive heart disease with heart failure: Secondary | ICD-10-CM | POA: Insufficient documentation

## 2019-09-23 DIAGNOSIS — I509 Heart failure, unspecified: Secondary | ICD-10-CM | POA: Insufficient documentation

## 2019-09-23 DIAGNOSIS — Z79899 Other long term (current) drug therapy: Secondary | ICD-10-CM | POA: Diagnosis not present

## 2019-09-23 DIAGNOSIS — R0602 Shortness of breath: Secondary | ICD-10-CM | POA: Diagnosis not present

## 2019-09-23 LAB — CBC WITH DIFFERENTIAL/PLATELET
Abs Immature Granulocytes: 0.01 10*3/uL (ref 0.00–0.07)
Basophils Absolute: 0 10*3/uL (ref 0.0–0.1)
Basophils Relative: 1 %
Eosinophils Absolute: 0.2 10*3/uL (ref 0.0–0.5)
Eosinophils Relative: 4 %
HCT: 31.8 % — ABNORMAL LOW (ref 36.0–46.0)
Hemoglobin: 10.9 g/dL — ABNORMAL LOW (ref 12.0–15.0)
Immature Granulocytes: 0 %
Lymphocytes Relative: 36 %
Lymphs Abs: 1.6 10*3/uL (ref 0.7–4.0)
MCH: 33.5 pg (ref 26.0–34.0)
MCHC: 34.3 g/dL (ref 30.0–36.0)
MCV: 97.8 fL (ref 80.0–100.0)
Monocytes Absolute: 0.7 10*3/uL (ref 0.1–1.0)
Monocytes Relative: 16 %
Neutro Abs: 1.9 10*3/uL (ref 1.7–7.7)
Neutrophils Relative %: 43 %
Platelets: 76 10*3/uL — ABNORMAL LOW (ref 150–400)
RBC: 3.25 MIL/uL — ABNORMAL LOW (ref 3.87–5.11)
RDW: 13.8 % (ref 11.5–15.5)
WBC: 4.4 10*3/uL (ref 4.0–10.5)
nRBC: 0 % (ref 0.0–0.2)

## 2019-09-23 LAB — COMPREHENSIVE METABOLIC PANEL
ALT: 24 U/L (ref 0–44)
AST: 37 U/L (ref 15–41)
Albumin: 2.6 g/dL — ABNORMAL LOW (ref 3.5–5.0)
Alkaline Phosphatase: 78 U/L (ref 38–126)
Anion gap: 3 — ABNORMAL LOW (ref 5–15)
BUN: 17 mg/dL (ref 8–23)
CO2: 26 mmol/L (ref 22–32)
Calcium: 8.6 mg/dL — ABNORMAL LOW (ref 8.9–10.3)
Chloride: 113 mmol/L — ABNORMAL HIGH (ref 98–111)
Creatinine, Ser: 0.87 mg/dL (ref 0.44–1.00)
GFR calc Af Amer: >60 mL/min (ref >60)
GFR calc non Af Amer: >60 mL/min (ref >60)
Glucose, Bld: 90 mg/dL (ref 70–99)
Potassium: 3.6 mmol/L (ref 3.5–5.1)
Sodium: 142 mmol/L (ref 135–145)
Total Bilirubin: 1.4 mg/dL — ABNORMAL HIGH (ref 0.3–1.2)
Total Protein: 6.5 g/dL (ref 6.5–8.1)

## 2019-09-23 LAB — BRAIN NATRIURETIC PEPTIDE: B Natriuretic Peptide: 206 pg/mL — ABNORMAL HIGH (ref 0.0–100.0)

## 2019-09-23 LAB — TROPONIN I (HIGH SENSITIVITY)
Troponin I (High Sensitivity): 3 ng/L (ref <18)
Troponin I (High Sensitivity): 3 ng/L (ref <18)

## 2019-09-23 IMAGING — DX DG CHEST 2V
2 series · 2 of 2 positions shown · non-contrast
Comparison: Radiograph [DATE]

CLINICAL DATA: Chest pain and shortness of breath. Left-sided pain.
Pain for 1 week. History of CHF, ran out of Lasix.

EXAM:
CHEST - 2 VIEW

[chest pa]
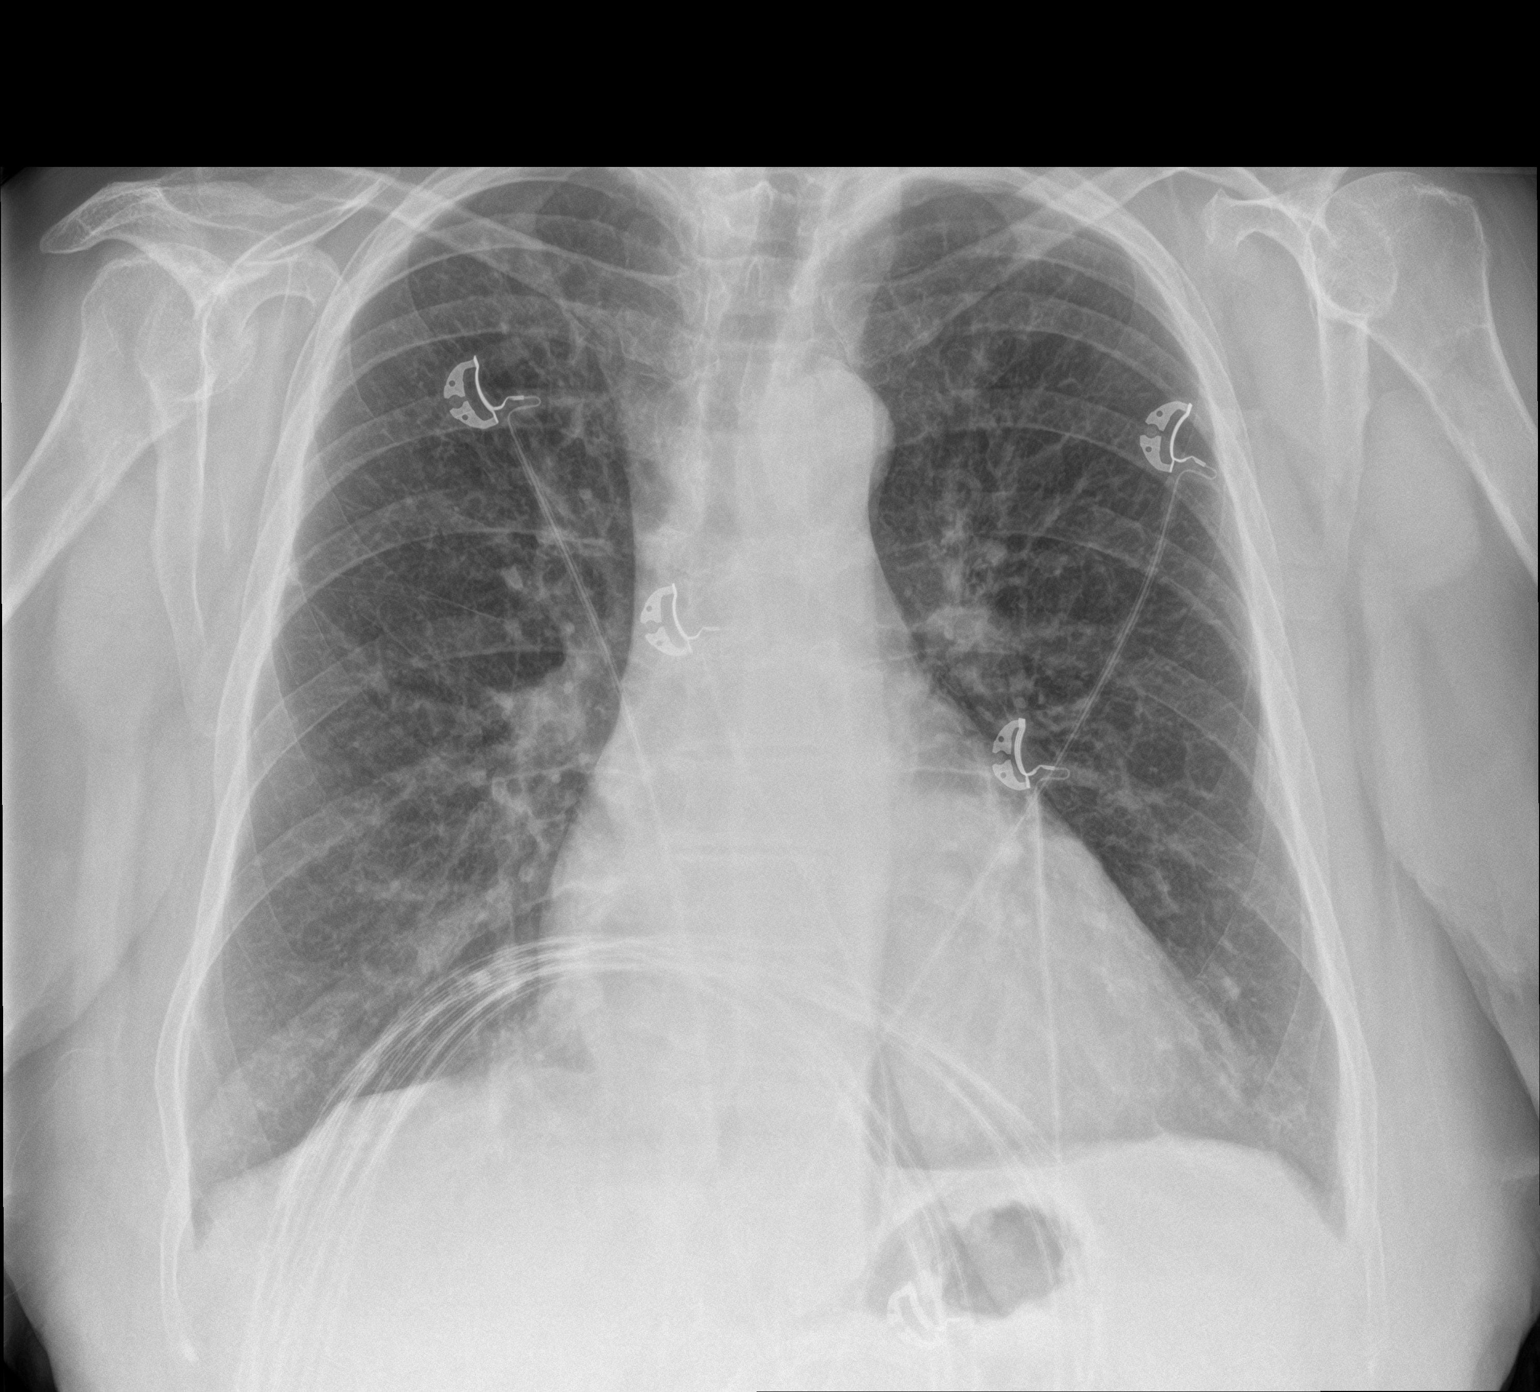

[chest lat]
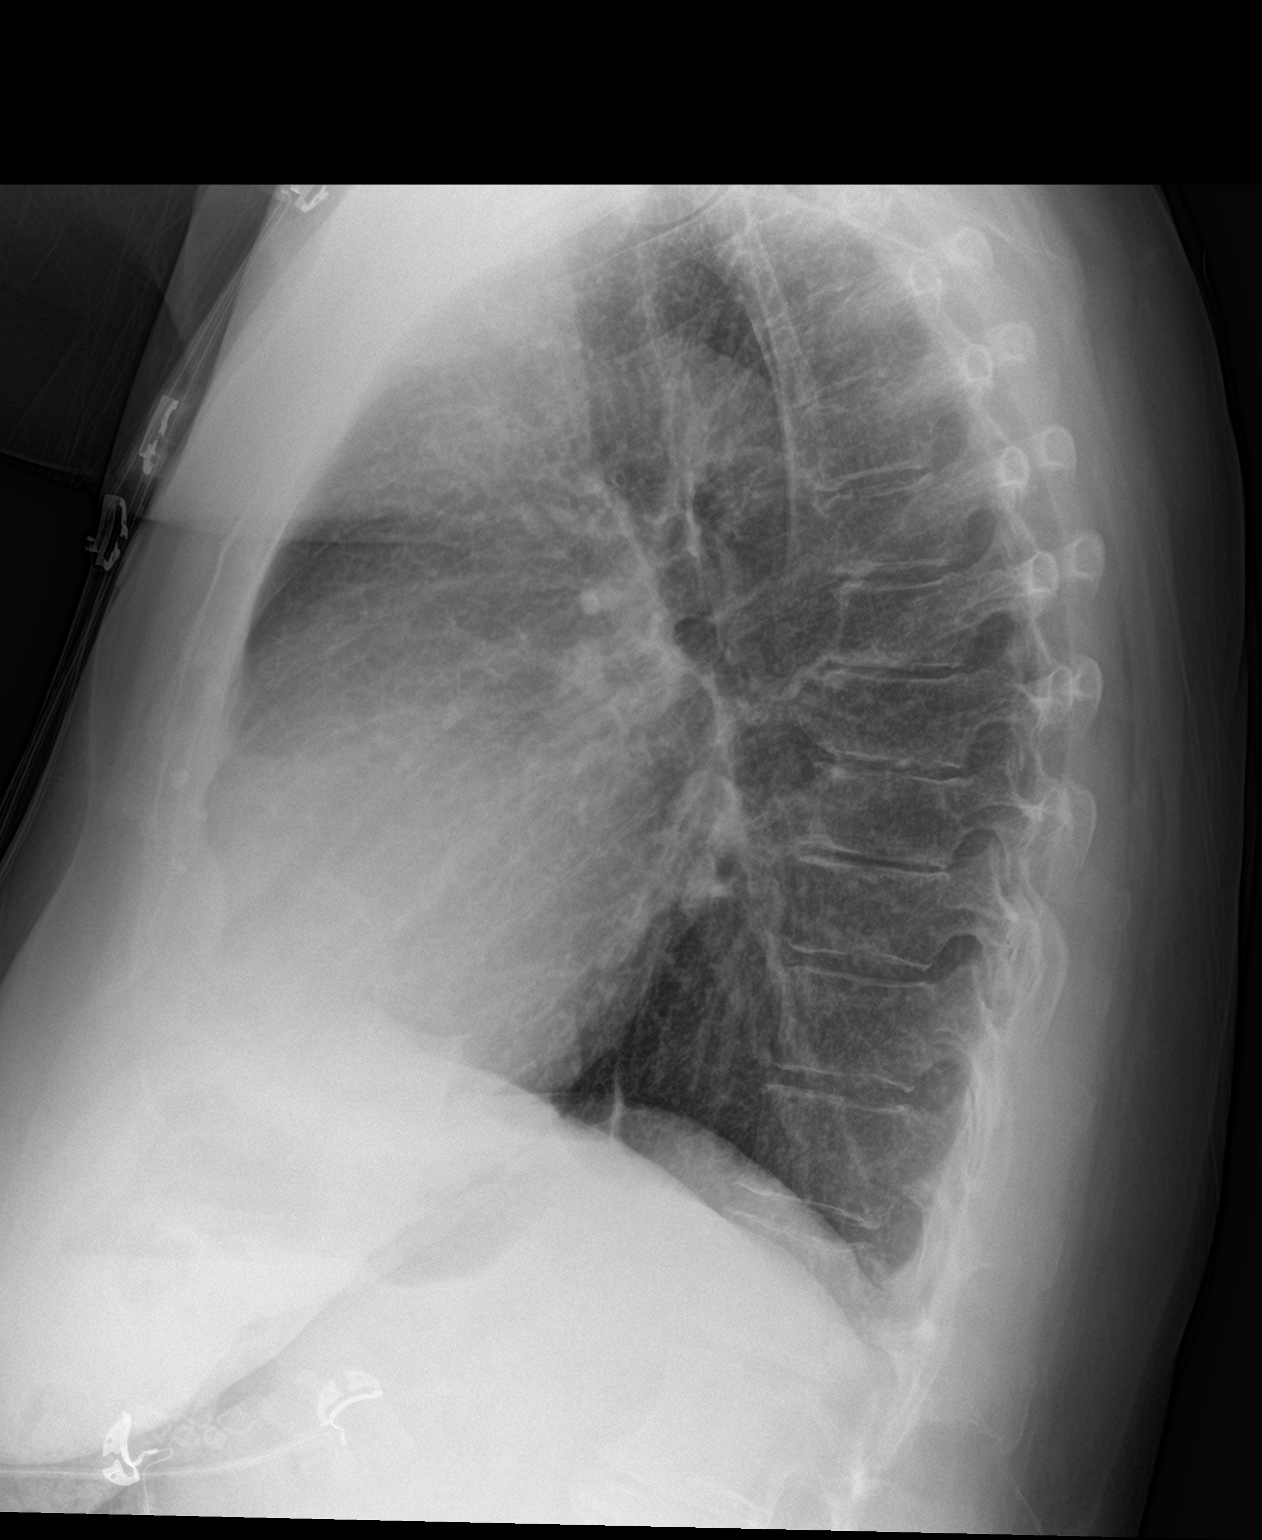

[2 of 2 positions shown; findings below may reference images not displayed]

FINDINGS: Stable mild cardiomegaly. Unchanged mediastinal contours. Mild
bronchial and interstitial thickening. No focal airspace disease,
pleural effusion or pneumothorax. No acute osseous abnormalities are
seen.
IMPRESSION: Mild cardiomegaly. Mild bronchial and interstitial thickening, may
be mild pulmonary edema.

## 2019-09-23 MED ORDER — ASPIRIN 81 MG PO CHEW
324.0000 mg | CHEWABLE_TABLET | Freq: Once | ORAL | Status: AC
  Administered 2019-09-23: 17:00:00 324 mg via ORAL
  Filled 2019-09-23: qty 4

## 2019-09-23 NOTE — ED Triage Notes (Signed)
Pt with left sided cp with sob for a week. Pt has run out of lasix, pt with hx of chf.

## 2019-09-23 NOTE — ED Notes (Signed)
ED Provider at bedside. 

## 2019-09-23 NOTE — ED Provider Notes (Signed)
[facility] EMERGENCY DEPARTMENT Provider Note  CSN: [id number] Arrival date & time: 09/23/19 1636    History Chief Complaint  Patient presents with  . Chest Pain    HPI  Alyssa Pena is a 69 y.o. female who presents to the emergency department for evaluation of chest pain.  She reports she is had intermittent, sharp fleeting chest pain for the last 7 to 10 days, comes and goes no particular provoking or relieving factors.  Associate with mild shortness of breath.  She reports particularly worse yesterday when she lost her mother-in-law in a department store.  She has a history of CHF but no known coronary artery disease.  She states she has never had a stress test or cardiac catheterization.  She is not entirely clear where the diagnosis of CHF came from but she has been taking Lasix until recently.  She also has a history of cirrhosis and hepatitis C.   Past Medical History:  Diagnosis Date  . Alcohol dependence ([place]) 08/10/2013  . Alcoholic hepatitis [id number]  . Anxiety   . Arthritis   . Bruises easily   . CHF (congestive heart failure) ([place])   . Cirrhosis ([place]) 08/10/2013  . Depression   . Dysrhythmia    before medication  . Fluid retention   . Full dentures    upper  . Hypertension   . Portal venous hypertension ([place]) 08/10/2013  . Renal disorder   . Thrombocytopenia ([place]) 08/10/2013    Past Surgical History:  Procedure Laterality Date  . ABDOMINAL HYSTERECTOMY    . BREAST SURGERY     lumpectomy benign  . CATARACT EXTRACTION W/PHACO Left 08/30/2016   Procedure: CATARACT EXTRACTION PHACOEMULSIFICATION AND INTRAOCULAR LENS PLACEMENT LEFT EYE CDE - 46.93;  Surgeon: Baruch Goldmann, MD;  Location: [facility];  Service: Ophthalmology;  Laterality: Left;  left  . CATARACT EXTRACTION W/PHACO Right 06/20/2017   Procedure: CATARACT EXTRACTION PHACO AND INTRAOCULAR LENS PLACEMENT RIGHT EYE;  Surgeon: Baruch Goldmann, MD;  Location: [facility];  Service: Ophthalmology;  Laterality: Right;   right  . CYSTOSCOPY WITH RETROGRADE PYELOGRAM, URETEROSCOPY AND STENT PLACEMENT Right 10/17/2018   Procedure: CYSTOSCOPY WITH RETROGRADE PYELOGRAM, URETEROSCOPY AND STENT PLACEMENT;  Surgeon: Ardis Hughs, MD;  Location: [facility];  Service: Urology;  Laterality: Right;  . CYSTOSCOPY/URETEROSCOPY/HOLMIUM LASER/STENT PLACEMENT N/A 01/04/2019   Procedure: CYSTOSCOPY/URETEROSCOPY/STENT PLACEMENT;  Surgeon: Raynelle Bring, MD;  [facility];  Service: Urology;  Laterality: N/A;  . IR URETERAL STENT RIGHT NEW ACCESS W/O SEP NEPHROSTOMY CATH  12/24/2018  . KIDNEY STONE SURGERY    . NEPHROLITHOTOMY Right 12/24/2018   Procedure: NEPHROLITHOTOMY PERCUTANEOUS;  Surgeon: Raynelle Bring, MD;  [facility];  Service: Urology;  Laterality: Right;  . URETEROSCOPY WITH HOLMIUM LASER LITHOTRIPSY Right 12/24/2018   Procedure: URETEROSCOPY;  Surgeon: Raynelle Bring, MD;  [facility];  Service: Urology;  Laterality: Right;    Family History  Problem Relation Age of Onset  . Lung cancer Father   . Lung cancer Brother   . COPD Brother   . Dementia Mother   . Arthritis Sister   . High blood pressure Sister   . COPD Sister     Social History   Tobacco Use  . Smoking status: Former Smoker    Types: Cigarettes  . Smokeless tobacco: Never Used  Substance Use Topics  . Alcohol use: Not Currently  . Drug use: Not Currently    Comment: younger years     Home Medications Prior to  Admission medications   Medication Sig Start Date End Date Taking? Authorizing Provider  carvedilol (COREG) 6.25 MG tablet Take 6.25 mg by mouth 2 (two) times daily. 04/21/18  Yes [provider]  furosemide (LASIX) 20 MG tablet Take 20 mg by mouth daily. 05/06/19  Yes [provider]  HYDROcodone-acetaminophen (NORCO/VICODIN) 5-325 MG tablet One tablet every six hours for pain.  Limit 7 days. Patient taking differently: Take 1 tablet by mouth every 6 (six) hours as needed for moderate pain or severe  pain. Limit of 7 days 08/24/19  Yes Sanjuana Kava, MD  meclizine (ANTIVERT) 25 MG tablet Take 25 mg by mouth 3 (three) times daily as needed for dizziness.   Yes [provider]  Menthol-Methyl Salicylate (MUSCLE RUB EX) Apply 1 application topically 2 (two) times daily as needed (back pain).   Yes [provider]     Allergies    Penicillins and Tape   Review of Systems   Review of Systems  Constitutional: Negative for fever.  HENT: Negative for congestion and sore throat.   Respiratory: Positive for shortness of breath. Negative for cough.   Cardiovascular: Positive for chest pain.  Gastrointestinal: Negative for abdominal pain, diarrhea, nausea and vomiting.  Genitourinary: Negative for dysuria.  Musculoskeletal: Negative for myalgias.  Skin: Negative for rash.  Neurological: Negative for headaches.  Psychiatric/Behavioral: Negative for behavioral problems.     Physical Exam BP (!) 153/72 (BP Location: Right Arm)   Pulse (!) 56   Temp 97.7 F (36.5 C) (Oral)   Resp 16   Ht 5\' 5"  (1.651 m)   Wt 86.2 kg   SpO2 98%   BMI 31.62 kg/m   Physical Exam Constitutional:      Appearance: Normal appearance.  HENT:     Head: Normocephalic and atraumatic.     Nose: Nose normal.     Mouth/Throat:     Mouth: Mucous membranes are moist.  Eyes:     Extraocular Movements: Extraocular movements intact.     Conjunctiva/sclera: Conjunctivae normal.  Cardiovascular:     Rate and Rhythm: Normal rate.  Pulmonary:     Effort: Pulmonary effort is normal.     Breath sounds: Normal breath sounds.  Chest:     Chest wall: Tenderness (Left chest, reproduces pain.) present.  Abdominal:     General: Abdomen is flat.     Palpations: Abdomen is soft.     Tenderness: There is no abdominal tenderness.  Musculoskeletal:        General: No swelling. Normal range of motion.     Cervical back: Neck supple.  Skin:    General: Skin is warm and dry.  Neurological:     General:  No focal deficit present.     Mental Status: She is alert.  Psychiatric:        Mood and Affect: Mood normal.      ED Results / Procedures / Treatments   Labs (all labs ordered are listed, but only abnormal results are displayed) Labs Reviewed  COMPREHENSIVE METABOLIC PANEL - Abnormal; Notable for the following components:      Result Value   Chloride 113 (*)    Calcium 8.6 (*)    Albumin 2.6 (*)    Total Bilirubin 1.4 (*)    Anion gap 3 (*)    All other components within normal limits  CBC WITH DIFFERENTIAL/PLATELET - Abnormal; Notable for the following components:   RBC 3.25 (*)    Hemoglobin 10.9 (*)  HCT 31.8 (*)    Platelets 76 (*)    All other components within normal limits  BRAIN NATRIURETIC PEPTIDE - Abnormal; Notable for the following components:   B Natriuretic Peptide 206.0 (*)    All other components within normal limits  TROPONIN I (HIGH SENSITIVITY)  TROPONIN I (HIGH SENSITIVITY)    EKG EKG Interpretation  Date/Time:  Thursday September 23 2019 16:49:42 EDT Ventricular Rate:  46 PR Interval:    QRS Duration: 105 QT Interval:  493 QTC Calculation: 432 R Axis:   35 Text Interpretation: Sinus bradycardia Prolonged PR interval Low voltage, precordial leads Probable anteroseptal infarct, old Since last tracing Nonspecific T wave abnormality NOW PRESENT Confirmed by Karle Starch  MD, Juanda Crumble [phone]) on 09/23/2019 4:53:36 PM   Radiology DG Chest 2 View  Result Date: 09/23/2019 CLINICAL DATA:  Chest pain and shortness of breath. Left-sided pain. Pain for 1 week. History of CHF, ran out of Lasix. EXAM: CHEST - 2 VIEW COMPARISON:  Radiograph 08/10/2013 FINDINGS: Stable mild cardiomegaly. Unchanged mediastinal contours. Mild bronchial and interstitial thickening. No focal airspace disease, pleural effusion or pneumothorax. No acute osseous abnormalities are seen. IMPRESSION: Mild cardiomegaly. Mild bronchial and interstitial thickening, may be mild pulmonary edema.  Electronically Signed   By: Keith Rake M.D.   On: 09/23/2019 18:02    Procedures Procedures  Medications Ordered in the ED Medications  aspirin chewable tablet 324 mg (324 mg Oral Given 09/23/19 1717)     ED Course  I have reviewed the triage vital signs and the nursing notes.  Pertinent labs & imaging results that were available during my care of the patient were reviewed by me and considered in my medical decision making (see chart for details).  Clinical Course as of Sep 22 2012  Thu Sep 23, 2019  1704 Patient with atypical chest pain, reproducible on exam.  Resting comfortably in no distress.  Awaiting labs and chest x-ray.   [CS]  [id number] Labs reviewed, initial Trop is neg. No other concerning findings, BNP only mildly elevated with vascular congestion on CXR. Patient has been out of her Lasix lately.    [CS]  2013 Patient's troponin is negative x2.  She is resting comfortably, without chest pain at this time.  She states she has refills on her medication she can pick up tomorrow.  She was encouraged to follow-up with her primary care physician for further evaluation however she is low risk for ACS/coronary artery disease and her symptoms are atypical and reproducible   [CS]    Clinical Course User Index [CS] Truddie Hidden, MD    MDM Rules/Calculators/A&P MDM Number of Diagnoses or Management Options Diagnosis management comments: Patient with no history of coronary artery disease here with atypical, sharp, reproducible chest pain.  Differential diagnosis includes atypical ACS, pneumothorax, musculoskeletal pain, PE.  Labs and chest x-ray were ordered to evaluate these conditions.  Her EKG does not show any ischemic changes.  She is pain-free at the time my evaluation.  Aspirin has also been ordered.    Amount and/or Complexity of Data Reviewed Clinical lab tests: ordered and reviewed Tests in the radiology section of CPT: reviewed Review and summarize past medical  records: yes Independent visualization of images, tracings, or specimens: yes  Risk of Complications, Morbidity, and/or Mortality Presenting problems: high Diagnostic procedures: high Management options: high    Final Clinical Impression(s) / ED Diagnoses Final diagnoses:  Chest wall pain    Rx / DC Orders ED Discharge Orders  None       Truddie Hidden, MD 09/23/19 2014

## 2019-10-01 ENCOUNTER — Ambulatory Visit (HOSPITAL_COMMUNITY)
Admission: RE | Admit: 2019-10-01 | Discharge: 2019-10-01 | Disposition: A | Discharge status: Home / Self Care | Payer: Medicare Other | Source: Ambulatory Visit | Attending: Orthopaedic Surgery | Admitting: Orthopaedic Surgery

## 2019-10-01 ENCOUNTER — Other Ambulatory Visit: Payer: Self-pay

## 2019-10-01 DIAGNOSIS — M542 Cervicalgia: Secondary | ICD-10-CM | POA: Diagnosis not present

## 2019-10-01 IMAGING — MR MR CERVICAL SPINE W/O CM
4 of 5 series · 22 of 48 positions shown · non-contrast
Comparison: Cervical spine radiographs [DATE]

CLINICAL DATA: Neck pain, chronic.

EXAM:
MRI CERVICAL SPINE WITHOUT CONTRAST
TECHNIQUE: Multiplanar, multisequence MR imaging of the cervical spine was
performed. No intravenous contrast was administered.

[Series 3: T2 · sagittal · 3.0mm · 0.65mm/px · 7 of 15 slices shown (1 of 2)]
[im 1/15]
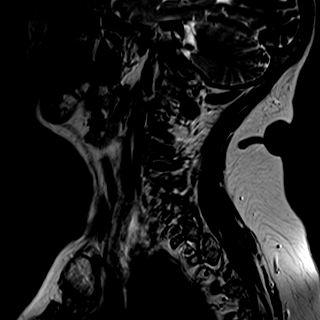
[im 3/15]
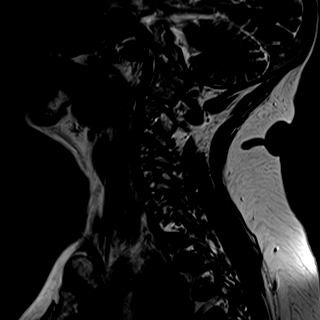
[im 5/15]
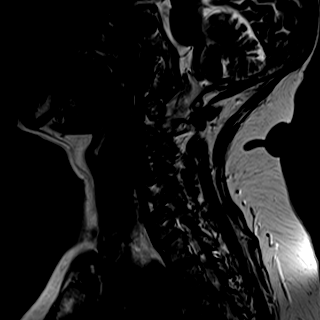
[im 8/15]
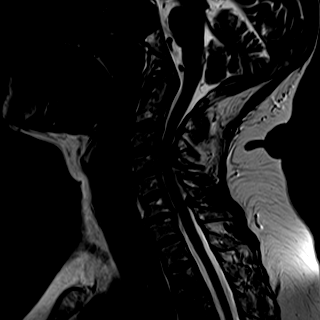
[im 10/15]
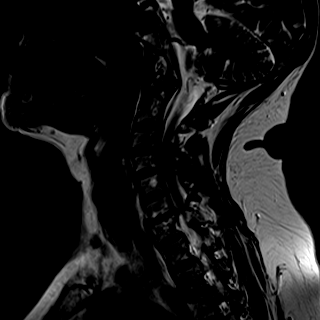
[im 12/15]
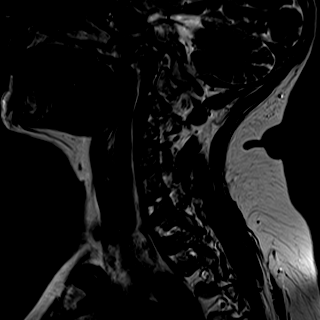
[im 15/15]
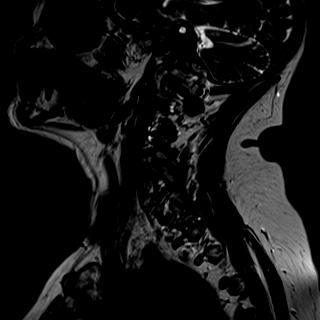

[Series 4: FLAIR · sagittal · 3.0mm · 0.65mm/px · 3 of 15 slices shown]
[im 3/15]
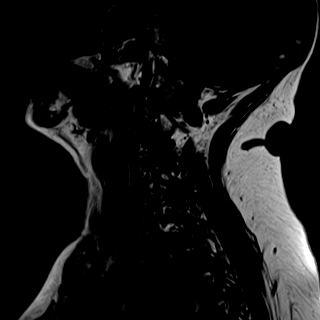
[im 8/15]
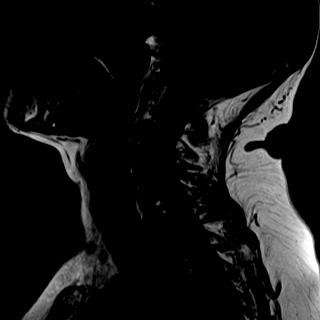
[im 12/15]
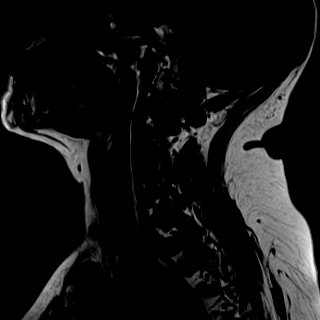

[Series 5: STIR · sagittal · 3.0mm · 0.33mm/px · 4 of 15 slices shown]
[im 1/15]
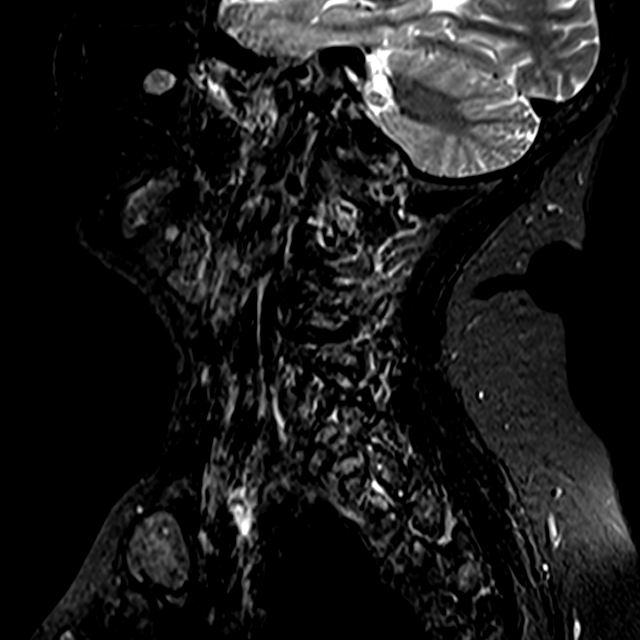
[im 3/15]
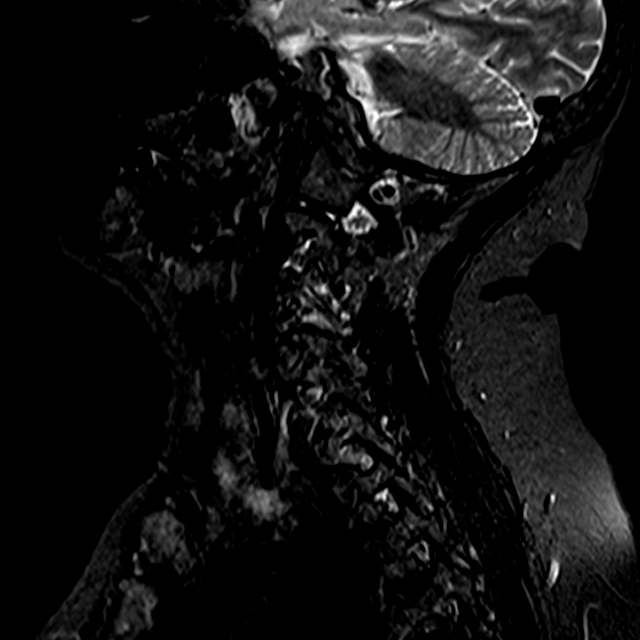
[im 9/15]
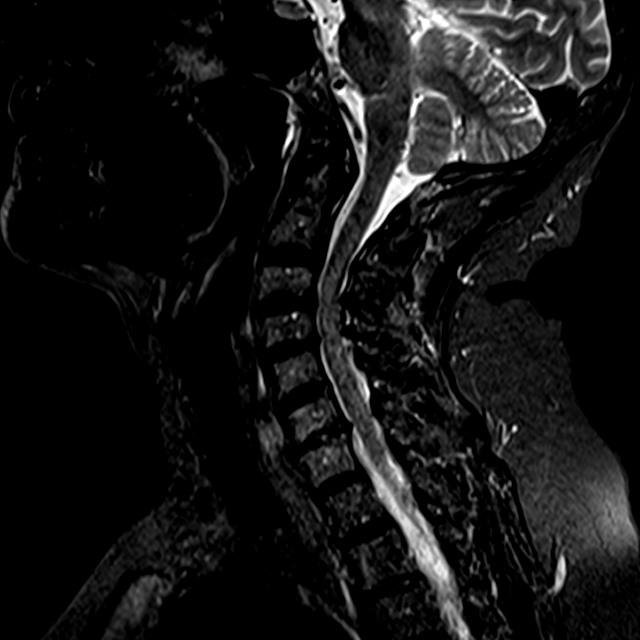
[im 15/15]
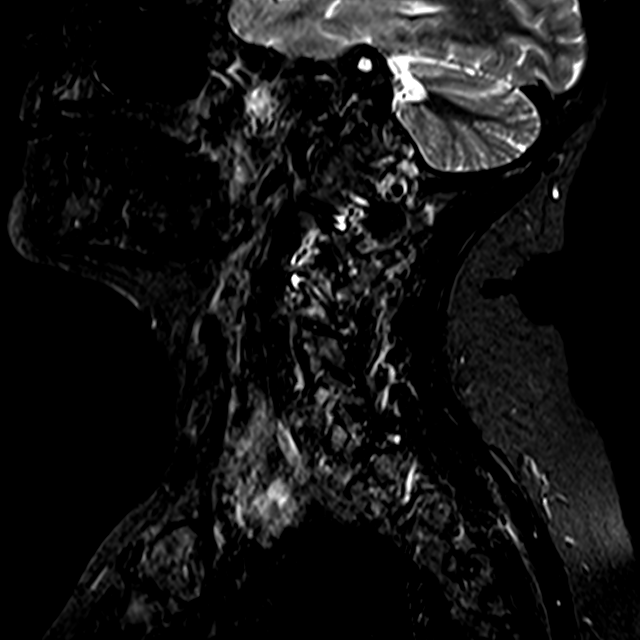

[Series 6: T2 · axial · 3.0mm · 0.34mm/px · z∈[-102,-0]mm · 8 of 34 slices shown (2 of 2)]
[im 1/34]
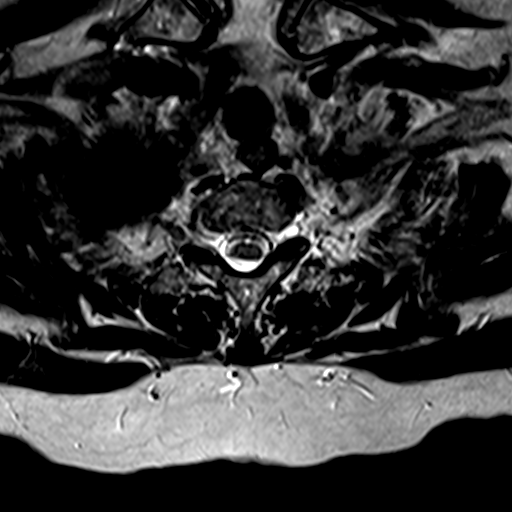
[im 6/34]
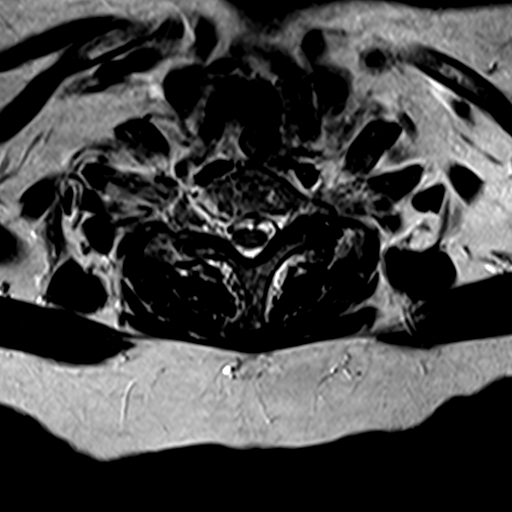
[im 11/34]
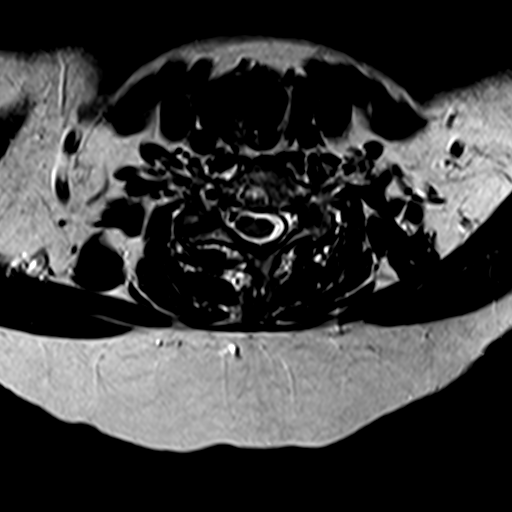
[im 16/34]
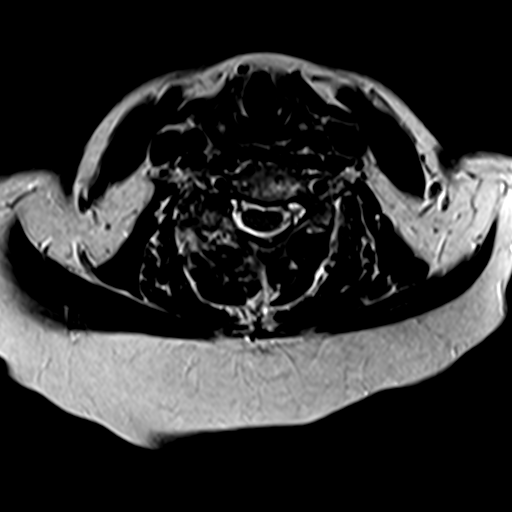
[im 18/34]
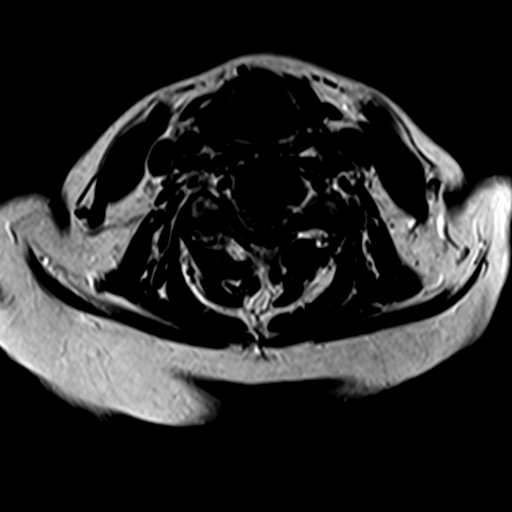
[im 23/34]
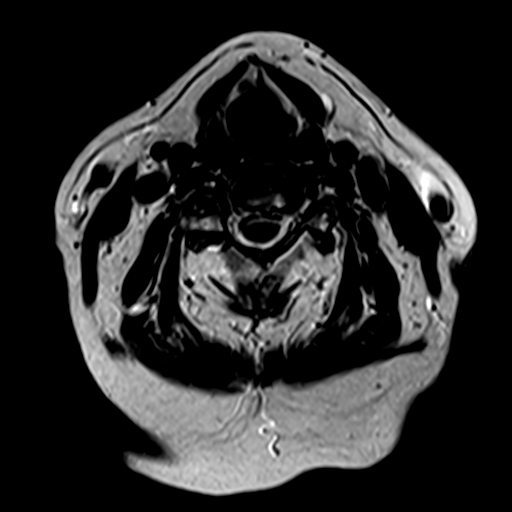
[im 28/34]
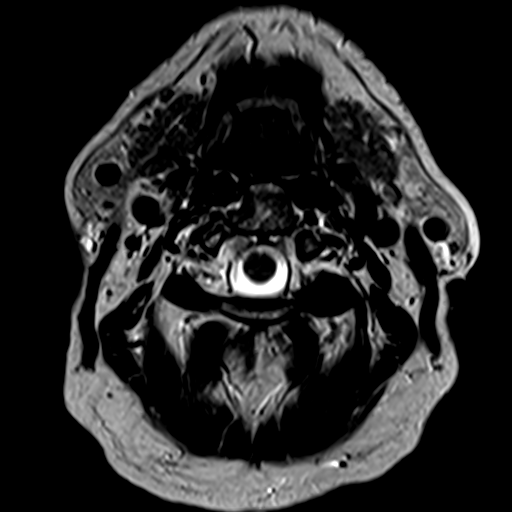
[im 34/34]
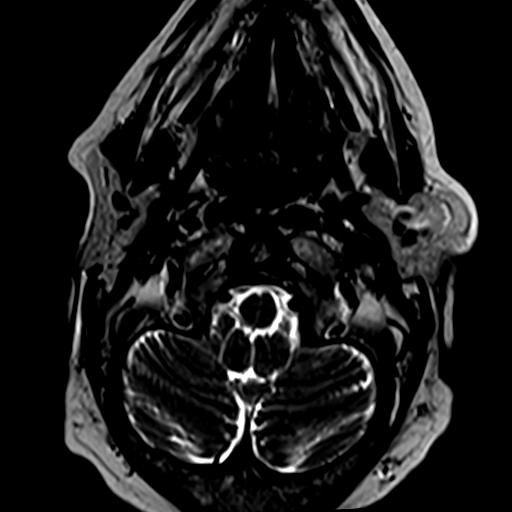

[22 of 48 positions shown; findings below may reference images not displayed]

FINDINGS: Alignment: No significant listhesis is present.

Vertebrae: Chronic endplate marrow changes are present at C6-7.
Scattered fatty infiltration of the marrow spaces is noted.
Vertebral body heights are maintained.

Cord: Normal signal and morphology.

Posterior Fossa, vertebral arteries, paraspinal tissues:
Craniocervical junction is normal. Flow is present in the vertebral
arteries bilaterally. Visualized intracranial contents are normal.

Disc levels:

C2-3: Asymmetric left-sided facet hypertrophy leads to mild left
foraminal narrowing. The central canal and right foramen patent.

C3-4: A broad-based disc osteophyte complex is present. The canal is
narrowed 6.5 mm. Moderate foraminal stenosis is worse on the left.

C4-5: A broad-based disc osteophyte complex effaces the ventral CSF.
Canal is narrowed to 7 mm. Moderate foraminal stenosis is present
bilaterally.

C5-6: Asymmetric right-sided facet hypertrophy is present. No
significant stenosis is present.

C6-7: Leftward disc osteophyte complex is present. Moderate to
severe left foraminal stenosis is present. Central canal is patent.

C7-T1: Negative.
IMPRESSION: 1. Mild left foraminal narrowing at C2-3.
2. Moderate central and bilateral foraminal stenosis at C3-4 is
worse on the left.
3. Moderate foraminal narrowing bilaterally at C4-5.
4. Moderate to severe left foraminal stenosis at C6-7.
5. Asymmetric right-sided facet hypertrophy at C5-6 without
significant stenosis.

## 2019-10-05 ENCOUNTER — Ambulatory Visit (INDEPENDENT_AMBULATORY_CARE_PROVIDER_SITE_OTHER): Payer: Medicare Other | Admitting: Orthopaedic Surgery

## 2019-10-05 ENCOUNTER — Encounter: Payer: Self-pay | Admitting: Orthopaedic Surgery

## 2019-10-05 ENCOUNTER — Other Ambulatory Visit: Payer: Self-pay

## 2019-10-05 VITALS — Ht 65.0 in | Wt 190.0 lb

## 2019-10-05 DIAGNOSIS — M25562 Pain in left knee: Secondary | ICD-10-CM

## 2019-10-05 DIAGNOSIS — M25561 Pain in right knee: Secondary | ICD-10-CM | POA: Diagnosis not present

## 2019-10-05 DIAGNOSIS — G8929 Other chronic pain: Secondary | ICD-10-CM | POA: Diagnosis not present

## 2019-10-05 DIAGNOSIS — M542 Cervicalgia: Secondary | ICD-10-CM | POA: Diagnosis not present

## 2019-10-05 MED ORDER — NAPROXEN 500 MG PO TABS: 500.0000 mg | ORAL_TABLET | Freq: Two times a day (BID) | ORAL | 5 refills | Status: DC

## 2019-10-05 NOTE — Progress Notes (Addendum)
Patient [id number] Alyssa Pena, female DOB:1951-02-13, 69 y.o. [id number]  Chief Complaint  Patient presents with  . Neck Pain  . Knee Pain    HPI  Alyssa Pena is a 69 y.o. female who has neck pain and bilateral knee pain.  She had MRI of the neck which showed: IMPRESSION: 1. Mild left foraminal narrowing at C2-3. 2. Moderate central and bilateral foraminal stenosis at C3-4 is worse on the left. 3. Moderate foraminal narrowing bilaterally at C4-5. 4. Moderate to severe left foraminal stenosis at C6-7. 5. Asymmetric right-sided facet hypertrophy at C5-6 without significant stenosis.  I have explained the findings to her.  I will begin Naprosyn.  Her knees are hurting, swelling but not giving way.   Body mass index is 31.62 kg/m.  ROS  Review of Systems  Constitutional: Positive for activity change.  Musculoskeletal: Positive for arthralgias, gait problem and joint swelling.    All other systems reviewed and are negative.  The following is a summary of the past history medically, past history surgically, known current medicines, social history and family history.  This information is gathered electronically by the computer from prior information and documentation.  I review this each visit and have found including this information at this point in the chart is beneficial and informative.    Past Medical History:  Diagnosis Date  . Alcohol dependence ([place]) 08/10/2013  . Alcoholic hepatitis [id number]  . Anxiety   . Arthritis   . Bruises easily   . CHF (congestive heart failure) ([place])   . Cirrhosis ([place]) 08/10/2013  . Depression   . Dysrhythmia    before medication  . Fluid retention   . Full dentures    upper  . Hypertension   . Portal venous hypertension ([place]) 08/10/2013  . Renal disorder   . Thrombocytopenia ([place]) 08/10/2013    Past Surgical History:  Procedure Laterality Date  . ABDOMINAL HYSTERECTOMY    . BREAST SURGERY     lumpectomy benign  . CATARACT  EXTRACTION W/PHACO Left 08/30/2016   Procedure: CATARACT EXTRACTION PHACOEMULSIFICATION AND INTRAOCULAR LENS PLACEMENT LEFT EYE CDE - 46.93;  Surgeon: Baruch Goldmann, MD;  Location: [facility];  Service: Ophthalmology;  Laterality: Left;  left  . CATARACT EXTRACTION W/PHACO Right 06/20/2017   Procedure: CATARACT EXTRACTION PHACO AND INTRAOCULAR LENS PLACEMENT RIGHT EYE;  Surgeon: Baruch Goldmann, MD;  Location: [facility];  Service: Ophthalmology;  Laterality: Right;  right  . CYSTOSCOPY WITH RETROGRADE PYELOGRAM, URETEROSCOPY AND STENT PLACEMENT Right 10/17/2018   Procedure: CYSTOSCOPY WITH RETROGRADE PYELOGRAM, URETEROSCOPY AND STENT PLACEMENT;  Surgeon: Ardis Hughs, MD;  Location: [facility];  Service: Urology;  Laterality: Right;  . CYSTOSCOPY/URETEROSCOPY/HOLMIUM LASER/STENT PLACEMENT N/A 01/04/2019   Procedure: CYSTOSCOPY/URETEROSCOPY/STENT PLACEMENT;  Surgeon: Raynelle Bring, MD;  Location: [facility];  Service: Urology;  Laterality: N/A;  . IR URETERAL STENT RIGHT NEW ACCESS W/O SEP NEPHROSTOMY CATH  12/24/2018  . KIDNEY STONE SURGERY    . NEPHROLITHOTOMY Right 12/24/2018   Procedure: NEPHROLITHOTOMY PERCUTANEOUS;  Surgeon: Raynelle Bring, MD;  Location: [facility];  Service: Urology;  Laterality: Right;  . URETEROSCOPY WITH HOLMIUM LASER LITHOTRIPSY Right 12/24/2018   Procedure: URETEROSCOPY;  Surgeon: Raynelle Bring, MD;  [facility];  Service: Urology;  Laterality: Right;    Family History  Problem Relation Age of Onset  . Lung cancer Father   . Lung cancer Brother   . COPD Brother   . Dementia Mother   . Arthritis Sister   . High blood pressure  Sister   . COPD Sister     Social History Social History   Tobacco Use  . Smoking status: Former Smoker    Types: Cigarettes  . Smokeless tobacco: Never Used  Substance Use Topics  . Alcohol use: Not Currently  . Drug use: Not Currently    Comment: younger years    Allergies  Allergen Reactions  . Penicillins Hives  . Tape     Bruises her  skin    Current Outpatient Medications  Medication Sig Dispense Refill  . carvedilol (COREG) 6.25 MG tablet Take 6.25 mg by mouth 2 (two) times daily.    . furosemide (LASIX) 20 MG tablet Take 20 mg by mouth daily.    Marland Kitchen HYDROcodone-acetaminophen (NORCO/VICODIN) 5-325 MG tablet One tablet every six hours for pain.  Limit 7 days. (Patient taking differently: Take 1 tablet by mouth every 6 (six) hours as needed for moderate pain or severe pain. Limit of 7 days) 28 tablet 0  . meclizine (ANTIVERT) 25 MG tablet Take 25 mg by mouth 3 (three) times daily as needed for dizziness.    . Menthol-Methyl Salicylate (MUSCLE RUB EX) Apply 1 application topically 2 (two) times daily as needed (back pain).    . naproxen (NAPROSYN) 500 MG tablet Take 1 tablet (500 mg total) by mouth 2 (two) times daily with a meal. 60 tablet 5   No current facility-administered medications for this visit.     Physical Exam  Height 5\' 5"  (1.651 m), weight 190 lb (86.2 kg).  Constitutional: overall normal hygiene, normal nutrition, well developed, normal grooming, normal body habitus. Assistive device:none  Musculoskeletal: gait and station Limp right, muscle tone and strength are normal, no tremors or atrophy is present.  .  Neurological: coordination overall normal.  Deep tendon reflex/nerve stretch intact.  Sensation normal.  Cranial nerves II-XII intact.   Skin:   Normal overall no scars, lesions, ulcers or rashes. No psoriasis.  Psychiatric: Alert and oriented x 3.  Recent memory intact, remote memory unclear.  Normal mood and affect. Well groomed.  Good eye contact.  Cardiovascular: overall no swelling, no varicosities, no edema bilaterally, normal temperatures of the legs and arms, no clubbing, cyanosis and good capillary refill.  Lymphatic: palpation is normal.  Both knees are tender, have crepitus, slight effusions, ROM right 0 to 105, left 0 to 110, stable, NV intact.  Neck with full motion.  All other  systems reviewed and are negative   The patient has been educated about the nature of the problem(s) and counseled on treatment options.  The patient appeared to understand what I have discussed and is in agreement with it.  Encounter Diagnoses  Name Primary?  . Chronic neck pain Yes  . Bilateral chronic knee pain   PROCEDURE NOTE:  The patient requests injections of the left knee , verbal consent was obtained.  The left knee was prepped appropriately after time out was performed.   Sterile technique was observed and injection of 1 cc of Depo-Medrol 40 mg with several cc's of plain xylocaine. Anesthesia was provided by ethyl chloride and a 20-gauge needle was used to inject the knee area. The injection was tolerated well.  A band aid dressing was applied.  The patient was advised to apply ice later today and tomorrow to the injection sight as needed.  PROCEDURE NOTE:  The patient requests injections of the right knee , verbal consent was obtained.  The right knee was prepped appropriately after time out was  performed.   Sterile technique was observed and injection of 1 cc of Depo-Medrol 40 mg with several cc's of plain xylocaine. Anesthesia was provided by ethyl chloride and a 20-gauge needle was used to inject the knee area. The injection was tolerated well.  A band aid dressing was applied.  The patient was advised to apply ice later today and tomorrow to the injection sight as needed.   PLAN Call if any problems.  Precautions discussed.  Continue current medications. I will begin Naprosyn 500 po bid pc.  Return to clinic 1 month   Electronically Signed Sanjuana Kava, MD 4/20/20212:59 PM

## 2019-10-19 DIAGNOSIS — K8020 Calculus of gallbladder without cholecystitis without obstruction: Secondary | ICD-10-CM | POA: Diagnosis not present

## 2019-10-19 DIAGNOSIS — K219 Gastro-esophageal reflux disease without esophagitis: Secondary | ICD-10-CM | POA: Diagnosis not present

## 2019-10-19 DIAGNOSIS — I10 Essential (primary) hypertension: Secondary | ICD-10-CM | POA: Diagnosis not present

## 2019-10-19 DIAGNOSIS — K7030 Alcoholic cirrhosis of liver without ascites: Secondary | ICD-10-CM | POA: Diagnosis not present

## 2019-10-19 DIAGNOSIS — G4709 Other insomnia: Secondary | ICD-10-CM | POA: Diagnosis not present

## 2019-10-19 DIAGNOSIS — I5032 Chronic diastolic (congestive) heart failure: Secondary | ICD-10-CM | POA: Diagnosis not present

## 2019-10-29 DIAGNOSIS — R161 Splenomegaly, not elsewhere classified: Secondary | ICD-10-CM | POA: Diagnosis not present

## 2019-10-29 DIAGNOSIS — N132 Hydronephrosis with renal and ureteral calculous obstruction: Secondary | ICD-10-CM | POA: Diagnosis not present

## 2019-10-29 DIAGNOSIS — R1084 Generalized abdominal pain: Secondary | ICD-10-CM | POA: Diagnosis not present

## 2019-10-29 DIAGNOSIS — K8020 Calculus of gallbladder without cholecystitis without obstruction: Secondary | ICD-10-CM | POA: Diagnosis not present

## 2019-11-02 ENCOUNTER — Ambulatory Visit (INDEPENDENT_AMBULATORY_CARE_PROVIDER_SITE_OTHER): Payer: Medicare Other | Admitting: Orthopaedic Surgery

## 2019-11-02 ENCOUNTER — Encounter: Payer: Self-pay | Admitting: Orthopaedic Surgery

## 2019-11-02 ENCOUNTER — Other Ambulatory Visit: Payer: Self-pay

## 2019-11-02 VITALS — Ht 65.0 in | Wt 189.0 lb

## 2019-11-02 DIAGNOSIS — M25562 Pain in left knee: Secondary | ICD-10-CM | POA: Diagnosis not present

## 2019-11-02 DIAGNOSIS — G8929 Other chronic pain: Secondary | ICD-10-CM

## 2019-11-02 DIAGNOSIS — M25561 Pain in right knee: Secondary | ICD-10-CM

## 2019-11-02 DIAGNOSIS — M542 Cervicalgia: Secondary | ICD-10-CM

## 2019-11-02 NOTE — Progress Notes (Signed)
I am so much better  She has had a very good response to the naprosyn.  She has little pain now of the neck or knees.  I have told her to try to cut back to one a day but if she needs two a day, continue that.  NV intact.   Encounter Diagnoses  Name Primary?  . Chronic neck pain Yes  . Bilateral chronic knee pain    I will see her in three months.  Call if any problem.  Precautions discussed.   Electronically Signed Sanjuana Kava, MD 5/18/20212:43 PM

## 2020-01-26 DIAGNOSIS — Z0000 Encounter for general adult medical examination without abnormal findings: Secondary | ICD-10-CM | POA: Diagnosis not present

## 2020-01-26 DIAGNOSIS — K7030 Alcoholic cirrhosis of liver without ascites: Secondary | ICD-10-CM | POA: Diagnosis not present

## 2020-01-26 DIAGNOSIS — Z131 Encounter for screening for diabetes mellitus: Secondary | ICD-10-CM | POA: Diagnosis not present

## 2020-01-26 DIAGNOSIS — I5032 Chronic diastolic (congestive) heart failure: Secondary | ICD-10-CM | POA: Diagnosis not present

## 2020-01-26 DIAGNOSIS — I10 Essential (primary) hypertension: Secondary | ICD-10-CM | POA: Diagnosis not present

## 2020-01-26 DIAGNOSIS — Z1331 Encounter for screening for depression: Secondary | ICD-10-CM | POA: Diagnosis not present

## 2020-01-26 DIAGNOSIS — K219 Gastro-esophageal reflux disease without esophagitis: Secondary | ICD-10-CM | POA: Diagnosis not present

## 2020-01-26 DIAGNOSIS — G4709 Other insomnia: Secondary | ICD-10-CM | POA: Diagnosis not present

## 2020-01-26 DIAGNOSIS — K8020 Calculus of gallbladder without cholecystitis without obstruction: Secondary | ICD-10-CM | POA: Diagnosis not present

## 2020-02-01 ENCOUNTER — Ambulatory Visit: Payer: Medicare Other | Admitting: Orthopaedic Surgery

## 2020-02-03 DIAGNOSIS — F29 Unspecified psychosis not due to a substance or known physiological condition: Secondary | ICD-10-CM | POA: Diagnosis not present

## 2020-02-03 DIAGNOSIS — W19XXXA Unspecified fall, initial encounter: Secondary | ICD-10-CM | POA: Diagnosis not present

## 2020-02-15 ENCOUNTER — Encounter: Payer: Self-pay | Admitting: Orthopaedic Surgery

## 2020-02-15 ENCOUNTER — Ambulatory Visit (INDEPENDENT_AMBULATORY_CARE_PROVIDER_SITE_OTHER): Payer: Medicare Other | Admitting: Orthopaedic Surgery

## 2020-02-15 ENCOUNTER — Other Ambulatory Visit: Payer: Self-pay

## 2020-02-15 DIAGNOSIS — M25562 Pain in left knee: Secondary | ICD-10-CM

## 2020-02-15 DIAGNOSIS — M25561 Pain in right knee: Secondary | ICD-10-CM | POA: Diagnosis not present

## 2020-02-15 DIAGNOSIS — G8929 Other chronic pain: Secondary | ICD-10-CM | POA: Diagnosis not present

## 2020-02-15 MED ORDER — TRAMADOL HCL 50 MG PO TABS: 50.0000 mg | ORAL_TABLET | Freq: Four times a day (QID) | ORAL | 3 refills | Status: AC | PRN

## 2020-02-15 NOTE — Progress Notes (Signed)
PROCEDURE NOTE:  The patient requests injections of the left knee , verbal consent was obtained.  The left knee was prepped appropriately after time out was performed.   Sterile technique was observed and injection of 1 cc of Depo-Medrol 40 mg with several cc's of plain xylocaine. Anesthesia was provided by ethyl chloride and a 20-gauge needle was used to inject the knee area. The injection was tolerated well.  A band aid dressing was applied.  The patient was advised to apply ice later today and tomorrow to the injection sight as needed.  PROCEDURE NOTE:  The patient requests injections of the right knee , verbal consent was obtained.  The right knee was prepped appropriately after time out was performed.   Sterile technique was observed and injection of 1 cc of Depo-Medrol 40 mg with several cc's of plain xylocaine. Anesthesia was provided by ethyl chloride and a 20-gauge needle was used to inject the knee area. The injection was tolerated well.  A band aid dressing was applied.  The patient was advised to apply ice later today and tomorrow to the injection sight as needed.  Electronically Signed Sanjuana Kava, MD 8/31/20213:12 PM

## 2020-02-17 DIAGNOSIS — Z23 Encounter for immunization: Secondary | ICD-10-CM | POA: Diagnosis not present

## 2020-03-22 DIAGNOSIS — Z23 Encounter for immunization: Secondary | ICD-10-CM | POA: Diagnosis not present

## 2020-04-25 ENCOUNTER — Ambulatory Visit (INDEPENDENT_AMBULATORY_CARE_PROVIDER_SITE_OTHER): Payer: Medicare Other | Admitting: Orthopaedic Surgery

## 2020-04-25 ENCOUNTER — Encounter: Payer: Self-pay | Admitting: Orthopaedic Surgery

## 2020-04-25 ENCOUNTER — Other Ambulatory Visit: Payer: Self-pay

## 2020-04-25 DIAGNOSIS — M25562 Pain in left knee: Secondary | ICD-10-CM

## 2020-04-25 DIAGNOSIS — M25561 Pain in right knee: Secondary | ICD-10-CM

## 2020-04-25 DIAGNOSIS — G8929 Other chronic pain: Secondary | ICD-10-CM | POA: Diagnosis not present

## 2020-04-25 NOTE — Progress Notes (Signed)
PROCEDURE NOTE:  The patient requests injections of the right knee , verbal consent was obtained.  The right knee was prepped appropriately after time out was performed.   Sterile technique was observed and injection of 1 cc of Depo-Medrol 40 mg with several cc's of plain xylocaine. Anesthesia was provided by ethyl chloride and a 20-gauge needle was used to inject the knee area. The injection was tolerated well.  A band aid dressing was applied.  The patient was advised to apply ice later today and tomorrow to the injection sight as needed.  PROCEDURE NOTE:  The patient requests injections of the left knee , verbal consent was obtained.  The left knee was prepped appropriately after time out was performed.   Sterile technique was observed and injection of 1 cc of Depo-Medrol 40 mg with several cc's of plain xylocaine. Anesthesia was provided by ethyl chloride and a 20-gauge needle was used to inject the knee area. The injection was tolerated well.  A band aid dressing was applied.  The patient was advised to apply ice later today and tomorrow to the injection sight as needed.   Return PRN.  Electronically Signed Sanjuana Kava, MD 11/9/202110:55 AM

## 2020-04-26 ENCOUNTER — Telehealth: Payer: Self-pay | Admitting: Orthopaedic Surgery

## 2020-04-26 NOTE — Telephone Encounter (Signed)
Patient called and states she has been to her pharmacy twice 1 time yesterday and again today and  it is not there.   She would like a refill on  traMADol HCl (Tab) ULTRAM 50 MG Take 50 mg by mouth every 6 (six) hours as needed.   [facility]: [facility] in [place]

## 2020-04-27 DIAGNOSIS — I10 Essential (primary) hypertension: Secondary | ICD-10-CM | POA: Diagnosis not present

## 2020-04-27 DIAGNOSIS — K219 Gastro-esophageal reflux disease without esophagitis: Secondary | ICD-10-CM | POA: Diagnosis not present

## 2020-04-27 DIAGNOSIS — K7030 Alcoholic cirrhosis of liver without ascites: Secondary | ICD-10-CM | POA: Diagnosis not present

## 2020-04-27 DIAGNOSIS — G4709 Other insomnia: Secondary | ICD-10-CM | POA: Diagnosis not present

## 2020-04-27 DIAGNOSIS — I5032 Chronic diastolic (congestive) heart failure: Secondary | ICD-10-CM | POA: Diagnosis not present

## 2020-04-27 MED ORDER — TRAMADOL HCL 50 MG PO TABS: 50.0000 mg | ORAL_TABLET | Freq: Four times a day (QID) | ORAL | 0 refills | Status: DC | PRN

## 2020-05-16 DIAGNOSIS — C44311 Basal cell carcinoma of skin of nose: Secondary | ICD-10-CM | POA: Diagnosis not present

## 2020-05-16 DIAGNOSIS — D485 Neoplasm of uncertain behavior of skin: Secondary | ICD-10-CM | POA: Diagnosis not present

## 2020-05-16 DIAGNOSIS — D2239 Melanocytic nevi of other parts of face: Secondary | ICD-10-CM | POA: Diagnosis not present

## 2020-05-16 DIAGNOSIS — L738 Other specified follicular disorders: Secondary | ICD-10-CM | POA: Diagnosis not present

## 2020-06-14 DIAGNOSIS — N6313 Unspecified lump in the right breast, lower outer quadrant: Secondary | ICD-10-CM | POA: Diagnosis not present

## 2020-06-14 DIAGNOSIS — N6314 Unspecified lump in the right breast, lower inner quadrant: Secondary | ICD-10-CM | POA: Diagnosis not present

## 2020-06-14 DIAGNOSIS — N6315 Unspecified lump in the right breast, overlapping quadrants: Secondary | ICD-10-CM | POA: Diagnosis not present

## 2020-06-14 DIAGNOSIS — R922 Inconclusive mammogram: Secondary | ICD-10-CM | POA: Diagnosis not present

## 2020-06-15 ENCOUNTER — Telehealth: Payer: Self-pay | Admitting: Orthopaedic Surgery

## 2020-06-15 NOTE — Telephone Encounter (Signed)
Patient called to request refill / aware refill requests will be addressed upon providers return 1/3-06/20/20:   HYDROcodone-acetaminophen (NORCO/VICODIN) 5-325 MG tablet 28 tablet  -[facility], [address]

## 2020-06-19 MED ORDER — HYDROCODONE-ACETAMINOPHEN 5-325 MG PO TABS: ORAL_TABLET | ORAL | 0 refills | Status: DC

## 2020-06-23 ENCOUNTER — Encounter: Payer: Self-pay | Admitting: Emergency Medicine

## 2020-06-23 ENCOUNTER — Other Ambulatory Visit: Payer: Self-pay

## 2020-06-23 ENCOUNTER — Ambulatory Visit
Admission: EM | Admit: 2020-06-23 | Discharge: 2020-06-23 | Disposition: A | Discharge status: Home / Self Care | Payer: Medicare Other | Attending: Family Medicine | Admitting: Family Medicine

## 2020-06-23 ENCOUNTER — Ambulatory Visit (INDEPENDENT_AMBULATORY_CARE_PROVIDER_SITE_OTHER): Payer: Medicare Other

## 2020-06-23 DIAGNOSIS — R059 Cough, unspecified: Secondary | ICD-10-CM | POA: Diagnosis not present

## 2020-06-23 DIAGNOSIS — K8020 Calculus of gallbladder without cholecystitis without obstruction: Secondary | ICD-10-CM | POA: Diagnosis not present

## 2020-06-23 DIAGNOSIS — R0602 Shortness of breath: Secondary | ICD-10-CM | POA: Diagnosis not present

## 2020-06-23 DIAGNOSIS — R509 Fever, unspecified: Secondary | ICD-10-CM | POA: Diagnosis not present

## 2020-06-23 DIAGNOSIS — R350 Frequency of micturition: Secondary | ICD-10-CM | POA: Diagnosis not present

## 2020-06-23 DIAGNOSIS — N3001 Acute cystitis with hematuria: Secondary | ICD-10-CM | POA: Diagnosis not present

## 2020-06-23 DIAGNOSIS — I509 Heart failure, unspecified: Secondary | ICD-10-CM | POA: Diagnosis not present

## 2020-06-23 LAB — POCT URINALYSIS DIP (MANUAL ENTRY)
Bilirubin, UA: NEGATIVE
Glucose, UA: NEGATIVE mg/dL
Ketones, POC UA: NEGATIVE mg/dL
Nitrite, UA: NEGATIVE
Spec Grav, UA: 1.015 (ref 1.010–1.025)
Urobilinogen, UA: >=8 E.U./dL — AB
pH, UA: 7 (ref 5.0–8.0)

## 2020-06-23 IMAGING — DX DG CHEST 2V
2 series · 2 of 2 positions shown · non-contrast
Comparison: [DATE]

CLINICAL DATA: Sob with cough since [REDACTED] Denies tobacco Hx of
chf Nki

EXAM:
CHEST - 2 VIEW

[chest pa]
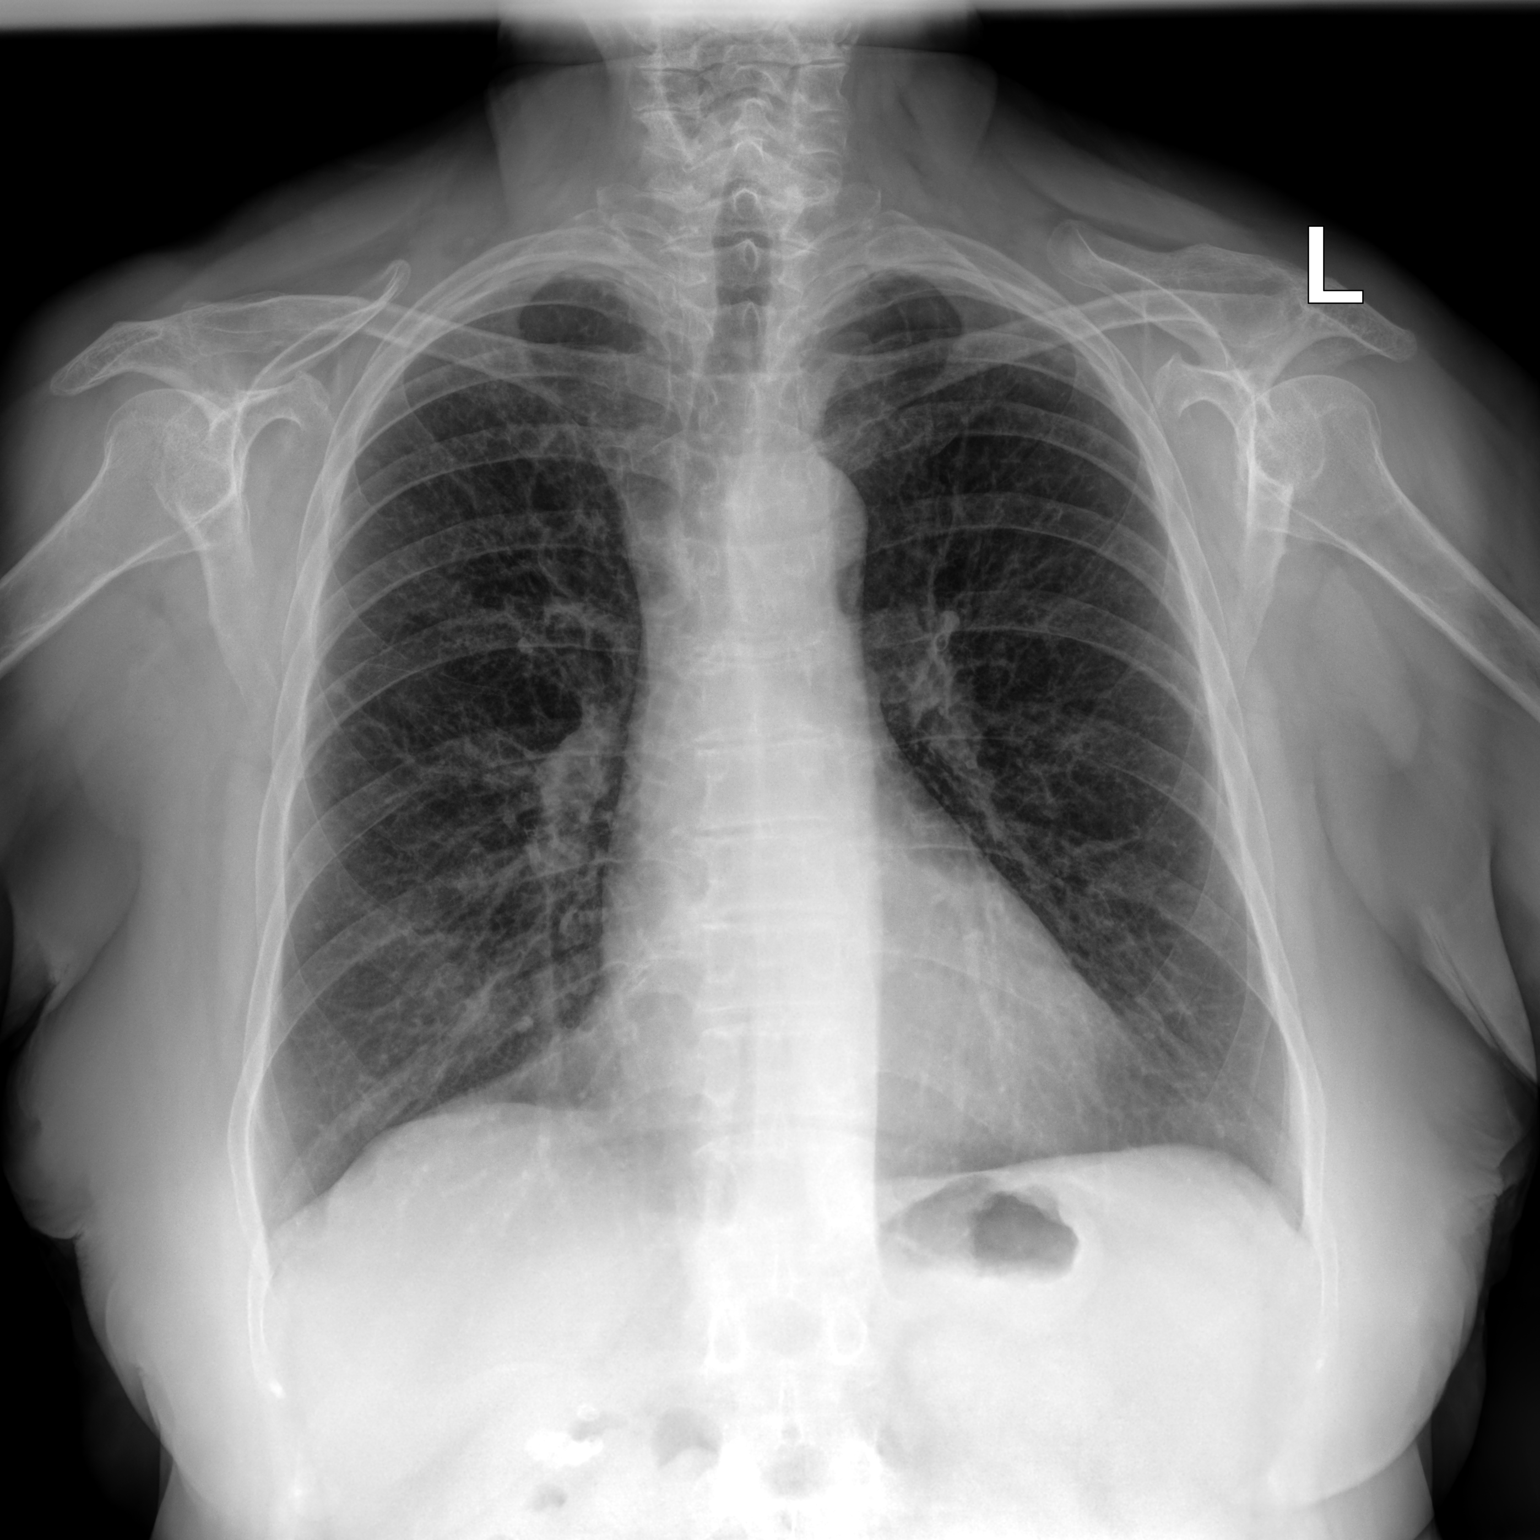

[chest lat]
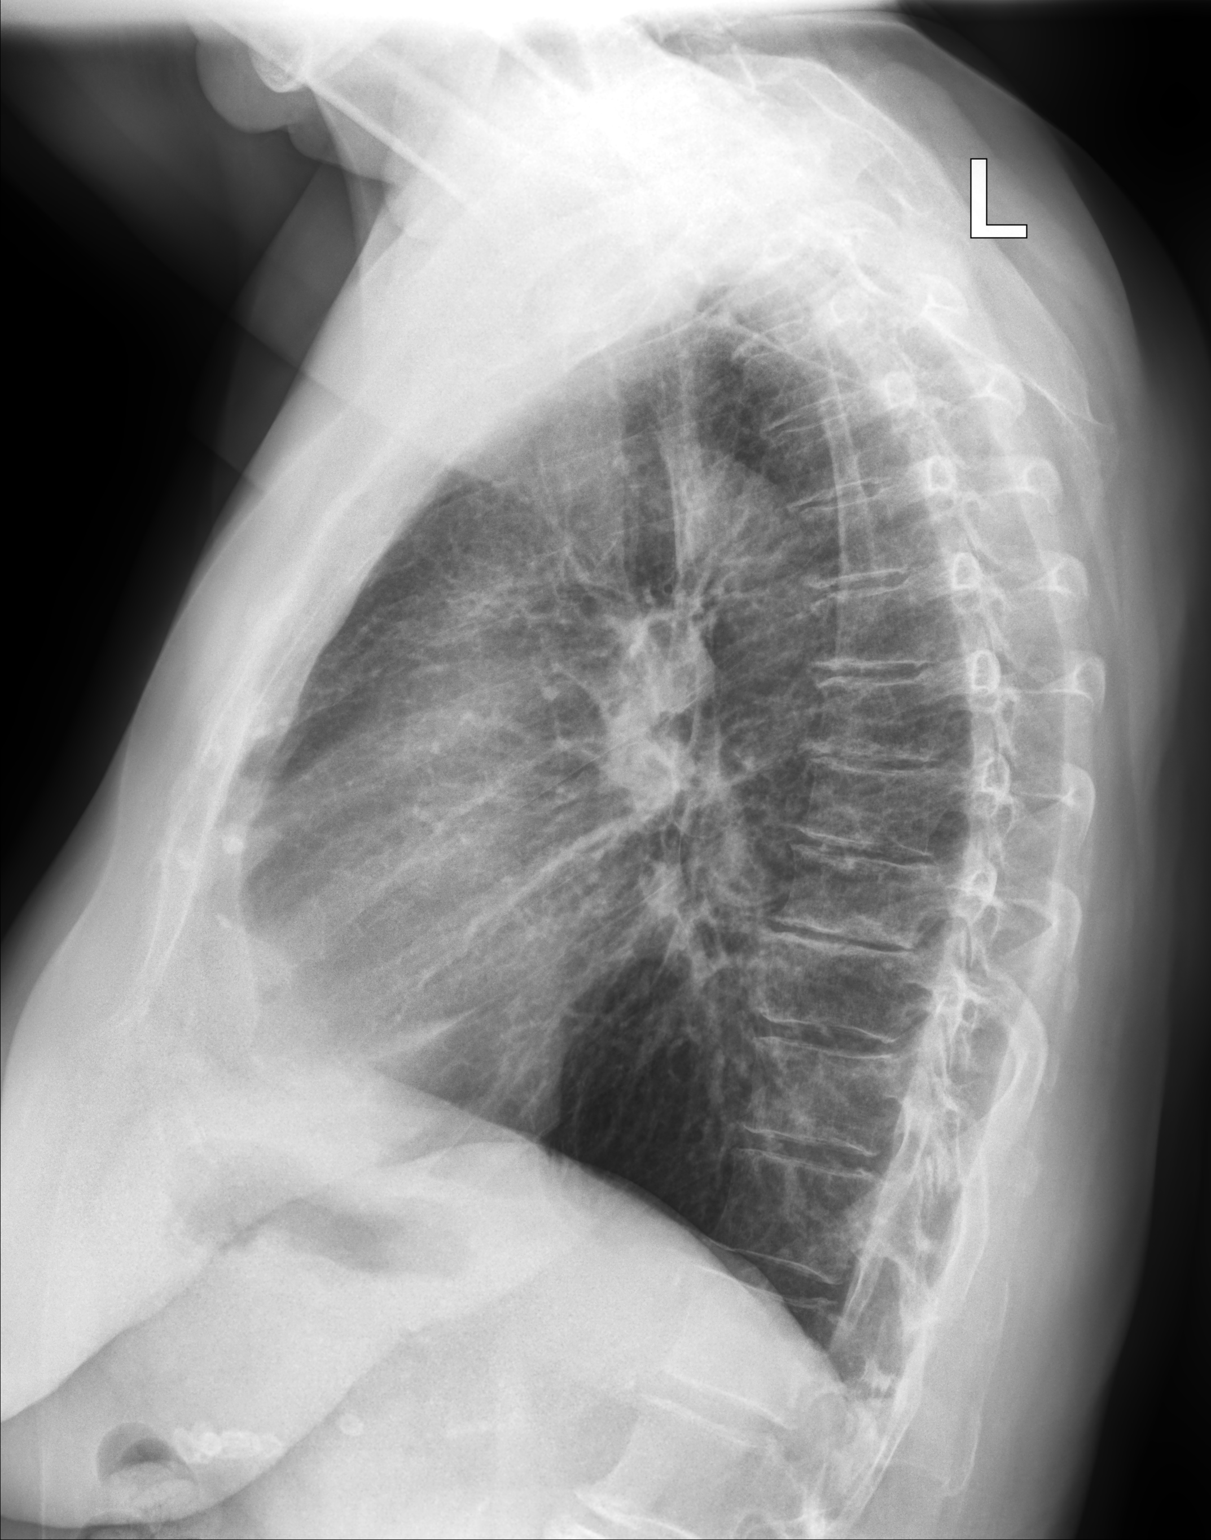

[2 of 2 positions shown; findings below may reference images not displayed]

FINDINGS: Lungs are clear.

Heart size and mediastinal contours are within normal limits.

No effusion.  No pneumothorax.

Visualized bones unremarkable. Multiple subcentimeter partially
calcified gallstones.
IMPRESSION: 1. No acute cardiopulmonary disease.
2. Cholelithiasis.

## 2020-06-23 MED ORDER — SULFAMETHOXAZOLE-TRIMETHOPRIM 800-160 MG PO TABS: 1.0000 | ORAL_TABLET | Freq: Two times a day (BID) | ORAL | 0 refills | Status: AC

## 2020-06-23 MED ORDER — ALBUTEROL SULFATE HFA 108 (90 BASE) MCG/ACT IN AERS: 1.0000 | INHALATION_SPRAY | Freq: Four times a day (QID) | RESPIRATORY_TRACT | 0 refills | Status: DC | PRN

## 2020-06-23 MED ORDER — PREDNISONE 10 MG (21) PO TBPK: ORAL_TABLET | Freq: Every day | ORAL | 0 refills | Status: DC

## 2020-06-23 NOTE — ED Triage Notes (Signed)
Nasal congestion, dry cough, sore throat, headache since Tuesday.  Pt also reports burning and urgency with urination that started last week.

## 2020-06-23 NOTE — ED Provider Notes (Signed)
[facility]    CSN: [id number] Arrival date & time: 06/23/20  1322      History   Chief Complaint No chief complaint on file.   HPI Alyssa Pena is a 70 y.o. female.   Pt here due to flu like symtpoms , fever, frequency and abd pain. Has chronic uti and had 4 surgeries for kidney stone in the past. She believes this is the same prob. No chest pain. Intermit sob, has not taken anything pta.      Past Medical History:  Diagnosis Date  . Alcohol dependence ([place]) 08/10/2013  . Alcoholic hepatitis 10/20/3974  . Anxiety   . Arthritis   . Bruises easily   . CHF (congestive heart failure) ([place])   . Cirrhosis ([place]) 08/10/2013  . Depression   . Dysrhythmia    before medication  . Fluid retention   . Full dentures    upper  . Hypertension   . Portal venous hypertension ([place]) 08/10/2013  . Renal disorder   . Thrombocytopenia ([place]) 08/10/2013    Patient Active Problem List   Diagnosis Date Noted  . Renal calculus 12/24/2018  . Ureteral calculus, right   . Benign essential HTN   . AKI (acute kidney injury) ([place]) 10/16/2018  . Hepatitis C antibody test positive 08/12/2013  . Diastolic CHF ([place]) 73/41/9379  . Pulmonary hypertension ([place]) 08/12/2013  . Hypokalemia 08/11/2013  . IV drug user history 08/11/2013  . Cirrhosis ([place]) 08/10/2013  . Alcoholic hepatitis 02/40/9735  . Portal venous hypertension ([place]) 08/10/2013  . Thrombocytopenia ([place]) 08/10/2013  . Alcohol dependence ([place]) 08/10/2013    Past Surgical History:  Procedure Laterality Date  . ABDOMINAL HYSTERECTOMY    . BREAST SURGERY     lumpectomy benign  . CATARACT EXTRACTION W/PHACO Left 08/30/2016   Procedure: CATARACT EXTRACTION PHACOEMULSIFICATION AND INTRAOCULAR LENS PLACEMENT LEFT EYE CDE - 46.93;  Surgeon: Baruch Goldmann, MD;  Location: [facility];  Service: Ophthalmology;  Laterality: Left;  left  . CATARACT EXTRACTION W/PHACO Right 06/20/2017   Procedure: CATARACT EXTRACTION PHACO AND INTRAOCULAR  LENS PLACEMENT RIGHT EYE;  Surgeon: Baruch Goldmann, MD;  Location: [facility];  Service: Ophthalmology;  Laterality: Right;  right  . CYSTOSCOPY WITH RETROGRADE PYELOGRAM, URETEROSCOPY AND STENT PLACEMENT Right 10/17/2018   Procedure: CYSTOSCOPY WITH RETROGRADE PYELOGRAM, URETEROSCOPY AND STENT PLACEMENT;  Surgeon: Ardis Hughs, MD;  Location: [facility];  Service: Urology;  Laterality: Right;  . CYSTOSCOPY/URETEROSCOPY/HOLMIUM LASER/STENT PLACEMENT N/A 01/04/2019   Procedure: CYSTOSCOPY/URETEROSCOPY/STENT PLACEMENT;  Surgeon: Raynelle Bring, MD;  [facility];  Service: Urology;  Laterality: N/A;  . IR URETERAL STENT RIGHT NEW ACCESS W/O SEP NEPHROSTOMY CATH  12/24/2018  . KIDNEY STONE SURGERY    . NEPHROLITHOTOMY Right 12/24/2018   Procedure: NEPHROLITHOTOMY PERCUTANEOUS;  Surgeon: Raynelle Bring, MD;  [facility];  Service: Urology;  Laterality: Right;  . URETEROSCOPY WITH HOLMIUM LASER LITHOTRIPSY Right 12/24/2018   Procedure: URETEROSCOPY;  Surgeon: Raynelle Bring, MD;  [facility];  Service: Urology;  Laterality: Right;    OB History   No obstetric history on file.      Home Medications    Prior to Admission medications   Medication Sig Start Date End Date Taking? Authorizing Provider  albuterol (VENTOLIN HFA) 108 (90 Base) MCG/ACT inhaler Inhale 1-2 puffs into the lungs every 6 (six) hours as needed for wheezing or shortness of breath. 06/23/20  Yes Marney Setting, NP  predniSONE (STERAPRED UNI-PAK 21 TAB) 10 MG (21) TBPK tablet  Take by mouth daily. Take 6 tabs by mouth daily  for 2 days, then 5 tabs for 2 days, then 4 tabs for 2 days, then 3 tabs for 2 days, 2 tabs for 2 days, then 1 tab by mouth daily for 2 days 06/23/20  Yes Coralyn Mark, NP  sulfamethoxazole-trimethoprim (BACTRIM DS) 800-160 MG tablet Take 1 tablet by mouth 2 (two) times daily for 7 days. 06/23/20 06/30/20 Yes Coralyn Mark, NP  carvedilol (COREG) 6.25 MG tablet Take 6.25 mg by mouth 2 (two) times  daily. 04/21/18   [provider]  furosemide (LASIX) 20 MG tablet Take 20 mg by mouth daily. 05/06/19   [provider]  HYDROcodone-acetaminophen (NORCO/VICODIN) 5-325 MG tablet One tablet every six hours for pain.  Limit 7 days. 06/19/20   Darreld Mclean, MD  meclizine (ANTIVERT) 25 MG tablet Take 25 mg by mouth 3 (three) times daily as needed for dizziness.    [provider]  Menthol-Methyl Salicylate (MUSCLE RUB EX) Apply 1 application topically 2 (two) times daily as needed (back pain). Patient not taking: Reported on 02/15/2020    [provider]  naproxen (NAPROSYN) 500 MG tablet Take 1 tablet (500 mg total) by mouth 2 (two) times daily with a meal. 10/05/19   Darreld Mclean, MD    Family History Family History  Problem Relation Age of Onset  . Lung cancer Father   . Lung cancer Brother   . COPD Brother   . Dementia Mother   . Arthritis Sister   . High blood pressure Sister   . COPD Sister     Social History Social History   Tobacco Use  . Smoking status: Former Smoker    Types: Cigarettes  . Smokeless tobacco: Never Used  Vaping Use  . Vaping Use: Never used  Substance Use Topics  . Alcohol use: Not Currently  . Drug use: Not Currently    Comment: younger years     Allergies   Penicillins and Tape   Review of Systems Review of Systems  Constitutional: Positive for fever.  HENT: Positive for rhinorrhea.   Respiratory: Positive for cough.   Cardiovascular: Negative.   Gastrointestinal: Positive for abdominal pain.  Genitourinary: Positive for difficulty urinating, hematuria and urgency.  Skin: Negative.   Neurological: Negative.      Physical Exam Triage Vital Signs ED Triage Vitals  Enc Vitals Group     BP 06/23/20 1339 126/74     Pulse Rate 06/23/20 1339 65     Resp 06/23/20 1339 19     Temp 06/23/20 1339 99 F (37.2 C)     Temp Source 06/23/20 1339 Oral     SpO2 06/23/20 1339 93 %     Weight 06/23/20 1339 180  lb (81.6 kg)     Height 06/23/20 1339 5\' 5"  (1.651 m)     Head Circumference --      Peak Flow --      Pain Score 06/23/20 1338 0     Pain Loc --      Pain Edu? --      Excl. in GC? --    No data found.  Updated Vital Signs BP 126/74 (BP Location: Right Arm)   Pulse 65   Temp 99 F (37.2 C) (Oral)   Resp 19   Ht 5\' 5"  (1.651 m)   Wt 180 lb (81.6 kg)   SpO2 93%   BMI 29.95 kg/m   Visual Acuity  Physical Exam HENT:     Right Ear: Tympanic membrane normal.     Left Ear: Tympanic membrane normal.  Cardiovascular:     Rate and Rhythm: Normal rate.  Pulmonary:     Breath sounds: Rhonchi present.     Comments: sats 93 ra  Abdominal:     General: Abdomen is flat. Bowel sounds are normal.  Skin:    General: Skin is warm.     Capillary Refill: Capillary refill takes less than 2 seconds.  Neurological:     Mental Status: She is alert.      UC Treatments / Results  Labs (all labs ordered are listed, but only abnormal results are displayed) Labs Reviewed  POCT URINALYSIS DIP (MANUAL ENTRY) - Abnormal; Notable for the following components:      Result Value   Blood, UA small (*)    Protein Ur, POC trace (*)    Urobilinogen, UA >=8.0 (*)    Leukocytes, UA Small (1+) (*)    All other components within normal limits  URINE CULTURE  COVID-19, FLU A+B NAA    EKG   Radiology DG Chest 2 View  Result Date: 06/23/2020 CLINICAL DATA:  Sob with cough since Tuesday Denies tobacco Hx of chf Nki EXAM: CHEST - 2 VIEW COMPARISON:  09/23/2019 FINDINGS: Lungs are clear. Heart size and mediastinal contours are within normal limits. No effusion.  No pneumothorax. Visualized bones unremarkable. Multiple subcentimeter partially calcified gallstones. IMPRESSION: 1. No acute cardiopulmonary disease. 2. Cholelithiasis. Electronically Signed   By: Lucrezia Europe M.D.   On: 06/23/2020 14:04    Procedures Procedures (including critical care time)  Medications Ordered in UC Medications -  No data to display  Initial Impression / Assessment and Plan / UC Course  I have reviewed the triage vital signs and the nursing notes.  Pertinent labs & imaging results that were available during my care of the patient were reviewed by me and considered in my medical decision making (see chart for details).     You do have gall stones on your x ray will need to follow up with your pcp for this.  X ray did not show any pneumonia  If your symptoms become worse and you are more short of breath you will need to go to the ER Your ox is on the lower end at 93 anything lower than 90 we want you to be re-seen  We have tested for covid these results will be in your my chart cont to check this.  Final Clinical Impressions(s) / UC Diagnoses   Final diagnoses:  Cough  Urine frequency  Fever, unspecified  Acute cystitis with hematuria     Discharge Instructions     You do have gall stones on your x ray will need to follow up with your pcp for this.  X ray did not show any pneumonia  If your symptoms become worse and you are more short of breath you will need to go to the ER Your ox is on the lower end at 93 anything lower than 90 we want you to be re-seen  We have tested for covid these results will be in your my chart cont to check this.      ED Prescriptions    Medication Sig Dispense Auth. Provider   sulfamethoxazole-trimethoprim (BACTRIM DS) 800-160 MG tablet Take 1 tablet by mouth 2 (two) times daily for 7 days. 14 tablet Marney Setting, NP   predniSONE (STERAPRED UNI-PAK 21  TAB) 10 MG (21) TBPK tablet Take by mouth daily. Take 6 tabs by mouth daily  for 2 days, then 5 tabs for 2 days, then 4 tabs for 2 days, then 3 tabs for 2 days, 2 tabs for 2 days, then 1 tab by mouth daily for 2 days 42 tablet Morley Kos L, NP   albuterol (VENTOLIN HFA) 108 (90 Base) MCG/ACT inhaler Inhale 1-2 puffs into the lungs every 6 (six) hours as needed for wheezing or shortness of breath. 90 g  Marney Setting, NP     PDMP not reviewed this encounter.   Marney Setting, NP 06/23/20 [phone]

## 2020-06-23 NOTE — Discharge Instructions (Addendum)
You do have gall stones on your x ray will need to follow up with your pcp for this.  X ray did not show any pneumonia  If your symptoms become worse and you are more short of breath you will need to go to the ER Your ox is on the lower end at 93 anything lower than 90 we want you to be re-seen  We have tested for covid these results will be in your my chart cont to check this.

## 2020-06-25 LAB — URINE CULTURE
Culture: NO GROWTH
Special Requests: NORMAL

## 2020-06-28 LAB — COVID-19, FLU A+B NAA
Influenza A, NAA: NOT DETECTED
Influenza B, NAA: NOT DETECTED
SARS-CoV-2, NAA: DETECTED — AB

## 2020-06-29 ENCOUNTER — Ambulatory Visit: Payer: Medicare Other | Admitting: Orthopaedic Surgery

## 2020-07-04 ENCOUNTER — Ambulatory Visit: Payer: Medicare Other | Admitting: Orthopaedic Surgery

## 2020-07-25 DIAGNOSIS — M810 Age-related osteoporosis without current pathological fracture: Secondary | ICD-10-CM | POA: Diagnosis not present

## 2020-07-25 DIAGNOSIS — M85851 Other specified disorders of bone density and structure, right thigh: Secondary | ICD-10-CM | POA: Diagnosis not present

## 2020-07-27 ENCOUNTER — Telehealth: Payer: Self-pay

## 2020-07-27 DIAGNOSIS — I5032 Chronic diastolic (congestive) heart failure: Secondary | ICD-10-CM | POA: Diagnosis not present

## 2020-07-27 DIAGNOSIS — G4709 Other insomnia: Secondary | ICD-10-CM | POA: Diagnosis not present

## 2020-07-27 DIAGNOSIS — K219 Gastro-esophageal reflux disease without esophagitis: Secondary | ICD-10-CM | POA: Diagnosis not present

## 2020-07-27 DIAGNOSIS — I10 Essential (primary) hypertension: Secondary | ICD-10-CM | POA: Diagnosis not present

## 2020-07-27 NOTE — Telephone Encounter (Signed)
Patient called for med refill  HYDROcodone-acetaminophen (NORCO/VICODIN) 5-325 MG tablet  Sig: One tablet every six hours for pain. Limit 7 days. QTY: 28 tablet  Pharmacy: [facility]

## 2020-07-31 MED ORDER — HYDROCODONE-ACETAMINOPHEN 5-325 MG PO TABS: ORAL_TABLET | ORAL | 0 refills | Status: DC

## 2020-07-31 NOTE — Addendum Note (Signed)
Addended by: Willette Pa on: 07/31/2020 01:28 PM   Modules accepted: Orders

## 2020-08-01 ENCOUNTER — Ambulatory Visit: Payer: Medicare Other | Admitting: Orthopaedic Surgery

## 2020-08-01 ENCOUNTER — Telehealth: Payer: Self-pay | Admitting: Orthopaedic Surgery

## 2020-08-01 ENCOUNTER — Other Ambulatory Visit: Payer: Self-pay

## 2020-08-01 ENCOUNTER — Encounter: Payer: Self-pay | Admitting: Orthopaedic Surgery

## 2020-08-01 VITALS — BP 146/76 | HR 59

## 2020-08-01 DIAGNOSIS — M25561 Pain in right knee: Secondary | ICD-10-CM

## 2020-08-01 DIAGNOSIS — M25562 Pain in left knee: Secondary | ICD-10-CM

## 2020-08-01 DIAGNOSIS — G8929 Other chronic pain: Secondary | ICD-10-CM | POA: Diagnosis not present

## 2020-08-01 NOTE — Telephone Encounter (Signed)
Patient call back and she does not want to proceed with an MRI or surgery at this time. She will call back at a later time and let the office know if she wants to go forward with surgery or any other procedures.

## 2020-08-01 NOTE — Telephone Encounter (Signed)
Called and left VM for pt to verify what MRI she would like (MRI Lt knee I'm assuming). Will order as soon as I get a response.

## 2020-08-01 NOTE — Progress Notes (Signed)
PROCEDURE NOTE:  The patient requests injections of the left knee , verbal consent was obtained.  The left knee was prepped appropriately after time out was performed.   Sterile technique was observed and injection of 1 cc of Celestone 6 mg with several cc's of plain xylocaine. Anesthesia was provided by ethyl chloride and a 20-gauge needle was used to inject the knee area. The injection was tolerated well.  A band aid dressing was applied.  The patient was advised to apply ice later today and tomorrow to the injection sight as needed.  PROCEDURE NOTE:  The patient requests injections of the right knee , verbal consent was obtained.  The right knee was prepped appropriately after time out was performed.   Sterile technique was observed and injection of 1 cc of Celestone 6 mg with several cc's of plain xylocaine. Anesthesia was provided by ethyl chloride and a 20-gauge needle was used to inject the knee area. The injection was tolerated well.  A band aid dressing was applied.  The patient was advised to apply ice later today and tomorrow to the injection sight as needed.  I will see prn.  Electronically Signed Sanjuana Kava, MD 2/15/20223:30 PM

## 2020-08-01 NOTE — Telephone Encounter (Signed)
Patient called and gave new insurance ID number.  He asks for [place] to proceed with getting approval for an MRI.  Would you take care of this please?  Thank you so much

## 2020-11-07 ENCOUNTER — Other Ambulatory Visit: Payer: Self-pay

## 2020-11-07 ENCOUNTER — Encounter: Payer: Self-pay | Admitting: Orthopaedic Surgery

## 2020-11-07 ENCOUNTER — Ambulatory Visit: Payer: Medicare Other | Admitting: Orthopaedic Surgery

## 2020-11-07 DIAGNOSIS — G8929 Other chronic pain: Secondary | ICD-10-CM

## 2020-11-07 DIAGNOSIS — M25562 Pain in left knee: Secondary | ICD-10-CM | POA: Diagnosis not present

## 2020-11-07 DIAGNOSIS — M25561 Pain in right knee: Secondary | ICD-10-CM

## 2020-11-07 NOTE — Patient Instructions (Signed)
Per Dr. Luna Glasgow patient will need to follow up with Dr. Amedeo Kinsman or Dr. Aline Brochure for knee surgery consult.

## 2020-11-07 NOTE — Progress Notes (Signed)
PROCEDURE NOTE:  The patient requests injections of the left knee , verbal consent was obtained.  The left knee was prepped appropriately after time out was performed.   Sterile technique was observed and injection of 1 cc of Celestone 6 mg with several cc's of plain xylocaine. Anesthesia was provided by ethyl chloride and a 20-gauge needle was used to inject the knee area. The injection was tolerated well.  A band aid dressing was applied.  The patient was advised to apply ice later today and tomorrow to the injection sight as needed.  PROCEDURE NOTE:  The patient requests injections of the right knee , verbal consent was obtained.  The right knee was prepped appropriately after time out was performed.   Sterile technique was observed and injection of 1 cc of Celestone 6 mg with several cc's of plain xylocaine. Anesthesia was provided by ethyl chloride and a 20-gauge needle was used to inject the knee area. The injection was tolerated well.  A band aid dressing was applied.  The patient was advised to apply ice later today and tomorrow to the injection sight as needed.  See prn.  Call if any problem.  Precautions discussed.   Electronically Signed Sanjuana Kava, MD 5/24/20223:04 PM

## 2020-11-10 ENCOUNTER — Ambulatory Visit: Payer: Medicare Other | Admitting: Orthopedic Surgery

## 2020-11-21 DIAGNOSIS — I5032 Chronic diastolic (congestive) heart failure: Secondary | ICD-10-CM | POA: Diagnosis not present

## 2020-11-21 DIAGNOSIS — I10 Essential (primary) hypertension: Secondary | ICD-10-CM | POA: Diagnosis not present

## 2020-11-21 DIAGNOSIS — Z0000 Encounter for general adult medical examination without abnormal findings: Secondary | ICD-10-CM | POA: Diagnosis not present

## 2020-11-21 DIAGNOSIS — G4709 Other insomnia: Secondary | ICD-10-CM | POA: Diagnosis not present

## 2020-11-21 DIAGNOSIS — K219 Gastro-esophageal reflux disease without esophagitis: Secondary | ICD-10-CM | POA: Diagnosis not present

## 2020-12-05 ENCOUNTER — Encounter: Payer: Self-pay | Admitting: Orthopedic Surgery

## 2020-12-05 ENCOUNTER — Ambulatory Visit: Payer: Medicare Other

## 2020-12-05 ENCOUNTER — Other Ambulatory Visit: Payer: Self-pay

## 2020-12-05 ENCOUNTER — Ambulatory Visit (INDEPENDENT_AMBULATORY_CARE_PROVIDER_SITE_OTHER): Payer: Medicare Other | Admitting: Orthopedic Surgery

## 2020-12-05 VITALS — BP 131/71 | HR 56 | Ht 65.0 in | Wt 189.0 lb

## 2020-12-05 DIAGNOSIS — M170 Bilateral primary osteoarthritis of knee: Secondary | ICD-10-CM

## 2020-12-05 DIAGNOSIS — G8929 Other chronic pain: Secondary | ICD-10-CM

## 2020-12-05 DIAGNOSIS — M1712 Unilateral primary osteoarthritis, left knee: Secondary | ICD-10-CM

## 2020-12-05 DIAGNOSIS — M25561 Pain in right knee: Secondary | ICD-10-CM

## 2020-12-05 DIAGNOSIS — M1711 Unilateral primary osteoarthritis, right knee: Secondary | ICD-10-CM

## 2020-12-05 NOTE — Progress Notes (Signed)
New Patient Visit  Assessment: Alyssa Pena is a 70 y.o. female with the following: 1. Bilateral knee arthritis; moderate to severe, R>L  Plan: Alyssa Pena has moderate to severe arthritis in bilateral knees.    We reviewed the radiographs in clinic today, and I outlined the natural progression.  We had an extensive discussion regarding all potential treatment options, including continuing with the current treatment. She is limited regarding medications as she has a cirrhotic liver.  She has used tramadol in the past, which has helped. I have urged her to remain active, and can continue with activities on their own, or we can refer them to physical therapy.  We can also consider a brace, or compression sleeve. If the pain is severe enough, we can consider a steroid injection, although she last had injections about a month ago. If their knee pain is affecting their everyday activities, including sleep, knee replacement is a consideration, but we would have to refer them to see my partner Dr. Aline Brochure.  After discussing all of these options, the patient has elected to proceed with medications as needed.  She will consider repeat injections in the future.  She is not interested in discussing knee replacement in more detail yet.  She is also aware that she is potentially a high risk surgical patient due to cirrhosis and CHF.    She can return to see me for injections, or Dr. Luna Glasgow, whomever she prefers.    Follow-up: Return if symptoms worsen or fail to improve.  Subjective:  Chief Complaint  Patient presents with   Knee Pain    Bilat knee pain for years.     History of Present Illness: Alyssa Pena is a 70 y.o. female who presents for evaluation of bilateral knee pain.  She has been followed by Dr. Luna Glasgow.  She has had pain for years.  No specific injury.  She has received multiple injections, last was a month ago.  She cannot take tylenol due to liver cirrhosis. She has taken tramadol  in the past, which has been helpful.  Pain is worse in the medial aspect of the knee.  Pain worsens in the winter.     Review of Systems: No fevers or chills No numbness or tingling No chest pain No shortness of breath No bowel or bladder dysfunction No GI distress No headaches   Medical History:  Past Medical History:  Diagnosis Date   Alcohol dependence ([place]) 2/62/0355   Alcoholic hepatitis 9/74/1638   Anxiety    Arthritis    Bruises easily    CHF (congestive heart failure) ([place])    Cirrhosis ([place]) 08/10/2013   Depression    Dysrhythmia    before medication   Fluid retention    Full dentures    upper   Hypertension    Portal venous hypertension ([place]) 08/10/2013   Renal disorder    Thrombocytopenia ([place]) 08/10/2013    Past Surgical History:  Procedure Laterality Date   ABDOMINAL HYSTERECTOMY     BREAST SURGERY     lumpectomy benign   CATARACT EXTRACTION W/PHACO Left 08/30/2016   Procedure: CATARACT EXTRACTION PHACOEMULSIFICATION AND INTRAOCULAR LENS PLACEMENT LEFT EYE CDE - 46.93;  Surgeon: Baruch Goldmann, MD;  Location: [facility];  Service: Ophthalmology;  Laterality: Left;  left   CATARACT EXTRACTION W/PHACO Right 06/20/2017   Procedure: CATARACT EXTRACTION PHACO AND INTRAOCULAR LENS PLACEMENT RIGHT EYE;  Surgeon: Baruch Goldmann, MD;  Location: [facility];  Service: Ophthalmology;  Laterality: Right;  right   CYSTOSCOPY WITH RETROGRADE PYELOGRAM, URETEROSCOPY AND STENT PLACEMENT Right 10/17/2018   Procedure: CYSTOSCOPY WITH RETROGRADE PYELOGRAM, URETEROSCOPY AND STENT PLACEMENT;  Surgeon: Ardis Hughs, MD;  Location: [facility];  Service: Urology;  Laterality: Right;   CYSTOSCOPY/URETEROSCOPY/HOLMIUM LASER/STENT PLACEMENT N/A 01/04/2019   Procedure: CYSTOSCOPY/URETEROSCOPY/STENT PLACEMENT;  Surgeon: Raynelle Bring, MD;  Location: [facility];  Service: Urology;  Laterality: N/A;   IR URETERAL STENT RIGHT NEW ACCESS W/O SEP NEPHROSTOMY CATH  12/24/2018   KIDNEY STONE SURGERY      NEPHROLITHOTOMY Right 12/24/2018   Procedure: NEPHROLITHOTOMY PERCUTANEOUS;  Surgeon: Raynelle Bring, MD;  Location: [facility];  Service: Urology;  Laterality: Right;   URETEROSCOPY WITH HOLMIUM LASER LITHOTRIPSY Right 12/24/2018   Procedure: URETEROSCOPY;  Surgeon: Raynelle Bring, MD;  [facility]: [facility];  Service: Urology;  Laterality: Right;    Family History  Problem Relation Age of Onset   Lung cancer Father    Lung cancer Brother    COPD Brother    Dementia Mother    Arthritis Sister    High blood pressure Sister    COPD Sister    Social History   Tobacco Use   Smoking status: Former    Pack years: 0.00    Types: Cigarettes   Smokeless tobacco: Never  Vaping Use   Vaping Use: Never used  Substance Use Topics   Alcohol use: Not Currently   Drug use: Not Currently    Comment: younger years    Allergies  Allergen Reactions   Penicillins Hives   Tape     Bruises her skin    Current Meds  Medication Sig   albuterol (VENTOLIN HFA) 108 (90 Base) MCG/ACT inhaler Inhale 1-2 puffs into the lungs every 6 (six) hours as needed for wheezing or shortness of breath.   carvedilol (COREG) 6.25 MG tablet Take 6.25 mg by mouth 2 (two) times daily.   furosemide (LASIX) 20 MG tablet Take 20 mg by mouth daily.   HYDROcodone-acetaminophen (NORCO/VICODIN) 5-325 MG tablet One tablet every six hours for pain.  Limit 7 days.   meclizine (ANTIVERT) 25 MG tablet Take 25 mg by mouth 3 (three) times daily as needed for dizziness.   Menthol-Methyl Salicylate (MUSCLE RUB EX) Apply 1 application topically 2 (two) times daily as needed (back pain).   naproxen (NAPROSYN) 500 MG tablet Take 1 tablet (500 mg total) by mouth 2 (two) times daily with a meal.    Objective: BP 131/71   Pulse (!) 56   Ht 5\' 5"  (1.651 m)   Wt 189 lb (85.7 kg)   BMI 31.45 kg/m   Physical Exam:  General: Alert and oriented, no acute distress.  Gait: Slow, steady gait   Bilateral knees with mild effusion.  Mild varus  alignment overall.  Tenderness along medial joint lines.  Full extension.  Flexion to 100 with pain.  Can maintain straight leg raise.  Toes are warm and well perfused.     IMAGING: I personally ordered and reviewed the following images  Bilateral knee XR were obtained in clinic today and demonstrates mild varus alignment.  Near complete loss of joint space within the medial compartment bilaterally.  No acute injuries.  Small osteophytes with subchondral sclerosis.  Moderate degenerative changes within the patellofemoral compartment, right worse than left.   Impression: moderate to severe bilateral knee arthritis, right worse than left.    New Medications:  No orders of the defined types were placed in this encounter.  Mordecai Rasmussen, MD  12/05/2020 9:59 PM

## 2020-12-05 NOTE — Patient Instructions (Signed)

## 2020-12-07 DIAGNOSIS — C44311 Basal cell carcinoma of skin of nose: Secondary | ICD-10-CM | POA: Diagnosis not present

## 2021-01-18 ENCOUNTER — Telehealth: Payer: Self-pay | Admitting: Orthopedic Surgery

## 2021-01-18 NOTE — Telephone Encounter (Signed)
Patient called to (initially) ask Dr Luna Glasgow for something to be prescribed for her knee pain. Patient aware that she was most recently seen by Dr Amedeo Kinsman in consult. Also aware Dr Luna Glasgow is out of clinic until 01/30/21. States if any medication prescribed, uses [facility] on [address].

## 2021-01-19 MED ORDER — TRAMADOL HCL 50 MG PO TABS: 50.0000 mg | ORAL_TABLET | Freq: Four times a day (QID) | ORAL | 0 refills | Status: DC | PRN

## 2021-01-22 NOTE — Telephone Encounter (Signed)
Dr C ordered this on 8/5

## 2021-03-08 DIAGNOSIS — I5032 Chronic diastolic (congestive) heart failure: Secondary | ICD-10-CM | POA: Diagnosis not present

## 2021-03-08 DIAGNOSIS — K219 Gastro-esophageal reflux disease without esophagitis: Secondary | ICD-10-CM | POA: Diagnosis not present

## 2021-03-08 DIAGNOSIS — G4709 Other insomnia: Secondary | ICD-10-CM | POA: Diagnosis not present

## 2021-03-08 DIAGNOSIS — I10 Essential (primary) hypertension: Secondary | ICD-10-CM | POA: Diagnosis not present

## 2021-03-27 DIAGNOSIS — R197 Diarrhea, unspecified: Secondary | ICD-10-CM | POA: Diagnosis not present

## 2021-04-18 ENCOUNTER — Telehealth: Payer: Self-pay

## 2021-04-18 NOTE — Telephone Encounter (Signed)
Tramadol 50 mg     PATIENT USES [facility] ON [address]

## 2021-04-20 ENCOUNTER — Emergency Department (HOSPITAL_COMMUNITY): Payer: Medicare Other

## 2021-04-20 ENCOUNTER — Other Ambulatory Visit: Payer: Self-pay

## 2021-04-20 ENCOUNTER — Emergency Department (HOSPITAL_COMMUNITY)
Admission: EM | Admit: 2021-04-20 | Discharge: 2021-04-20 | Disposition: A | Discharge status: Home / Self Care | Payer: Medicare Other | Attending: Emergency Medicine | Admitting: Emergency Medicine

## 2021-04-20 ENCOUNTER — Encounter (HOSPITAL_COMMUNITY): Payer: Self-pay | Admitting: Radiology

## 2021-04-20 DIAGNOSIS — S79921A Unspecified injury of right thigh, initial encounter: Secondary | ICD-10-CM | POA: Diagnosis not present

## 2021-04-20 DIAGNOSIS — W19XXXA Unspecified fall, initial encounter: Secondary | ICD-10-CM | POA: Diagnosis not present

## 2021-04-20 DIAGNOSIS — S79911A Unspecified injury of right hip, initial encounter: Secondary | ICD-10-CM | POA: Diagnosis not present

## 2021-04-20 DIAGNOSIS — R0902 Hypoxemia: Secondary | ICD-10-CM | POA: Diagnosis not present

## 2021-04-20 DIAGNOSIS — I110 Hypertensive heart disease with heart failure: Secondary | ICD-10-CM | POA: Diagnosis not present

## 2021-04-20 DIAGNOSIS — Z79899 Other long term (current) drug therapy: Secondary | ICD-10-CM | POA: Insufficient documentation

## 2021-04-20 DIAGNOSIS — Y92007 Garden or yard of unspecified non-institutional (private) residence as the place of occurrence of the external cause: Secondary | ICD-10-CM | POA: Insufficient documentation

## 2021-04-20 DIAGNOSIS — R52 Pain, unspecified: Secondary | ICD-10-CM | POA: Diagnosis not present

## 2021-04-20 DIAGNOSIS — W108XXA Fall (on) (from) other stairs and steps, initial encounter: Secondary | ICD-10-CM | POA: Diagnosis not present

## 2021-04-20 DIAGNOSIS — S7001XA Contusion of right hip, initial encounter: Secondary | ICD-10-CM | POA: Diagnosis not present

## 2021-04-20 DIAGNOSIS — I5030 Unspecified diastolic (congestive) heart failure: Secondary | ICD-10-CM | POA: Diagnosis not present

## 2021-04-20 DIAGNOSIS — Y9389 Activity, other specified: Secondary | ICD-10-CM | POA: Insufficient documentation

## 2021-04-20 DIAGNOSIS — M25551 Pain in right hip: Secondary | ICD-10-CM | POA: Diagnosis not present

## 2021-04-20 DIAGNOSIS — Z743 Need for continuous supervision: Secondary | ICD-10-CM | POA: Diagnosis not present

## 2021-04-20 DIAGNOSIS — Z87891 Personal history of nicotine dependence: Secondary | ICD-10-CM | POA: Diagnosis not present

## 2021-04-20 DIAGNOSIS — M47816 Spondylosis without myelopathy or radiculopathy, lumbar region: Secondary | ICD-10-CM | POA: Diagnosis not present

## 2021-04-20 IMAGING — DX DG HIP (WITH OR WITHOUT PELVIS) 2-3V*R*
3 series · 3 of 3 positions shown · non-contrast
Comparison: None.

CLINICAL DATA: Recent fall with right-sided hip pain, initial
encounter

EXAM:
DG HIP (WITH OR WITHOUT PELVIS) 3V RIGHT

[pelvis ap]
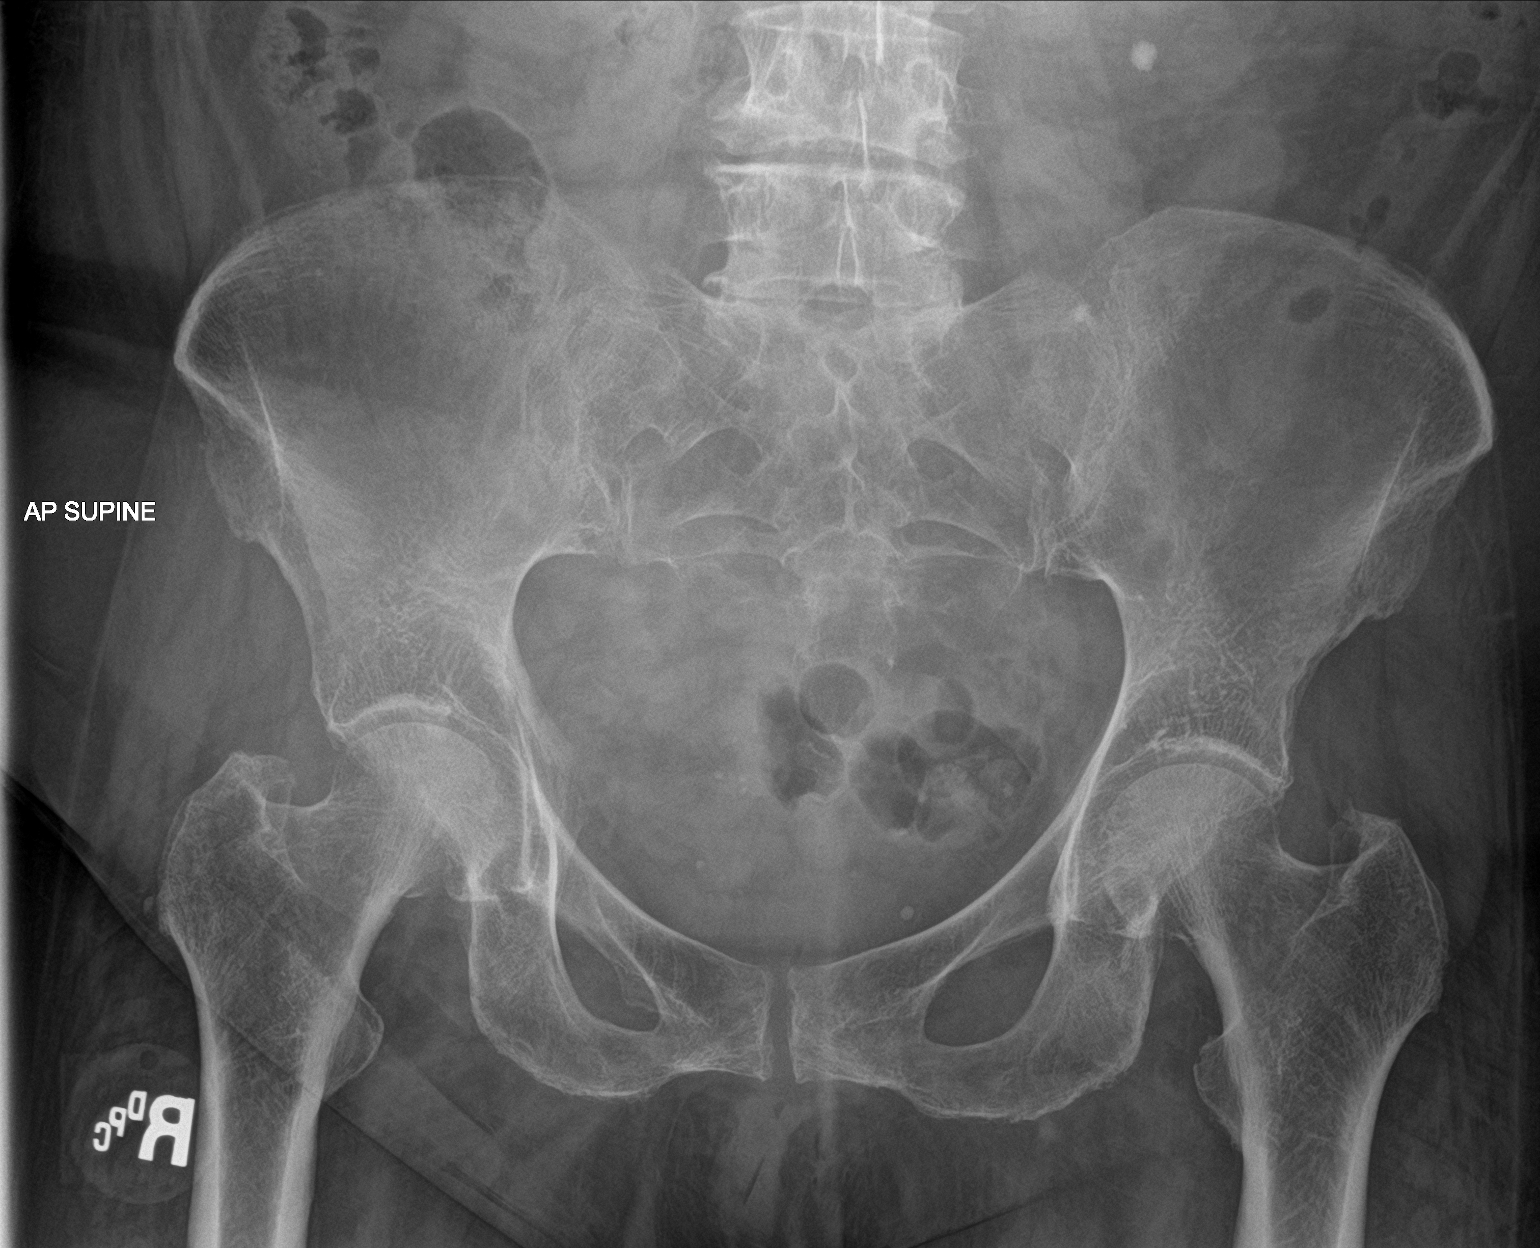

[hip ap]
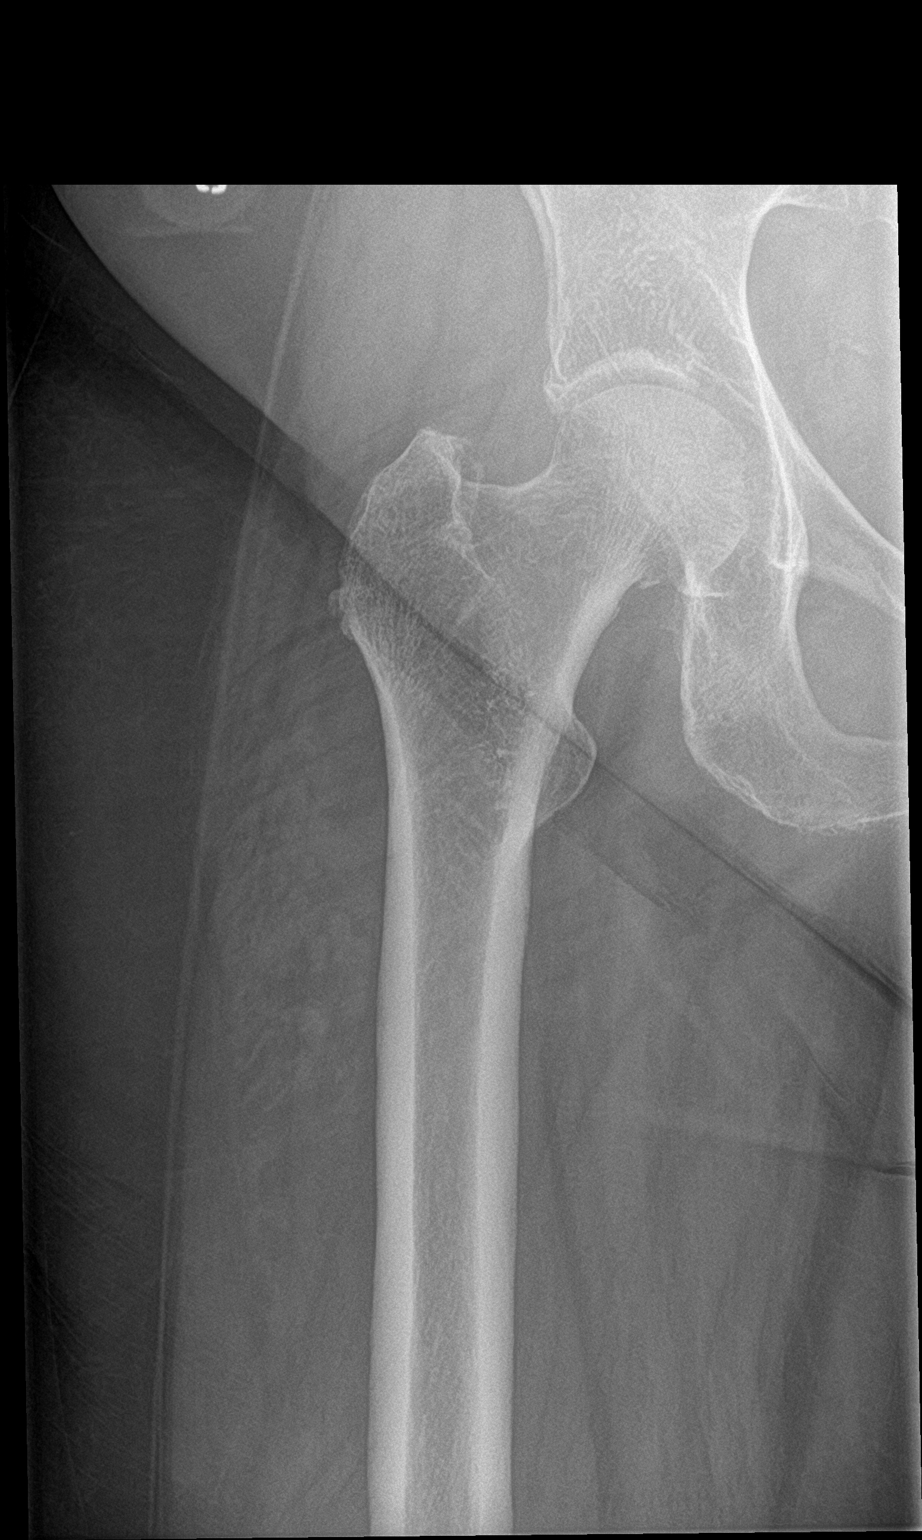

[hip frog leg]
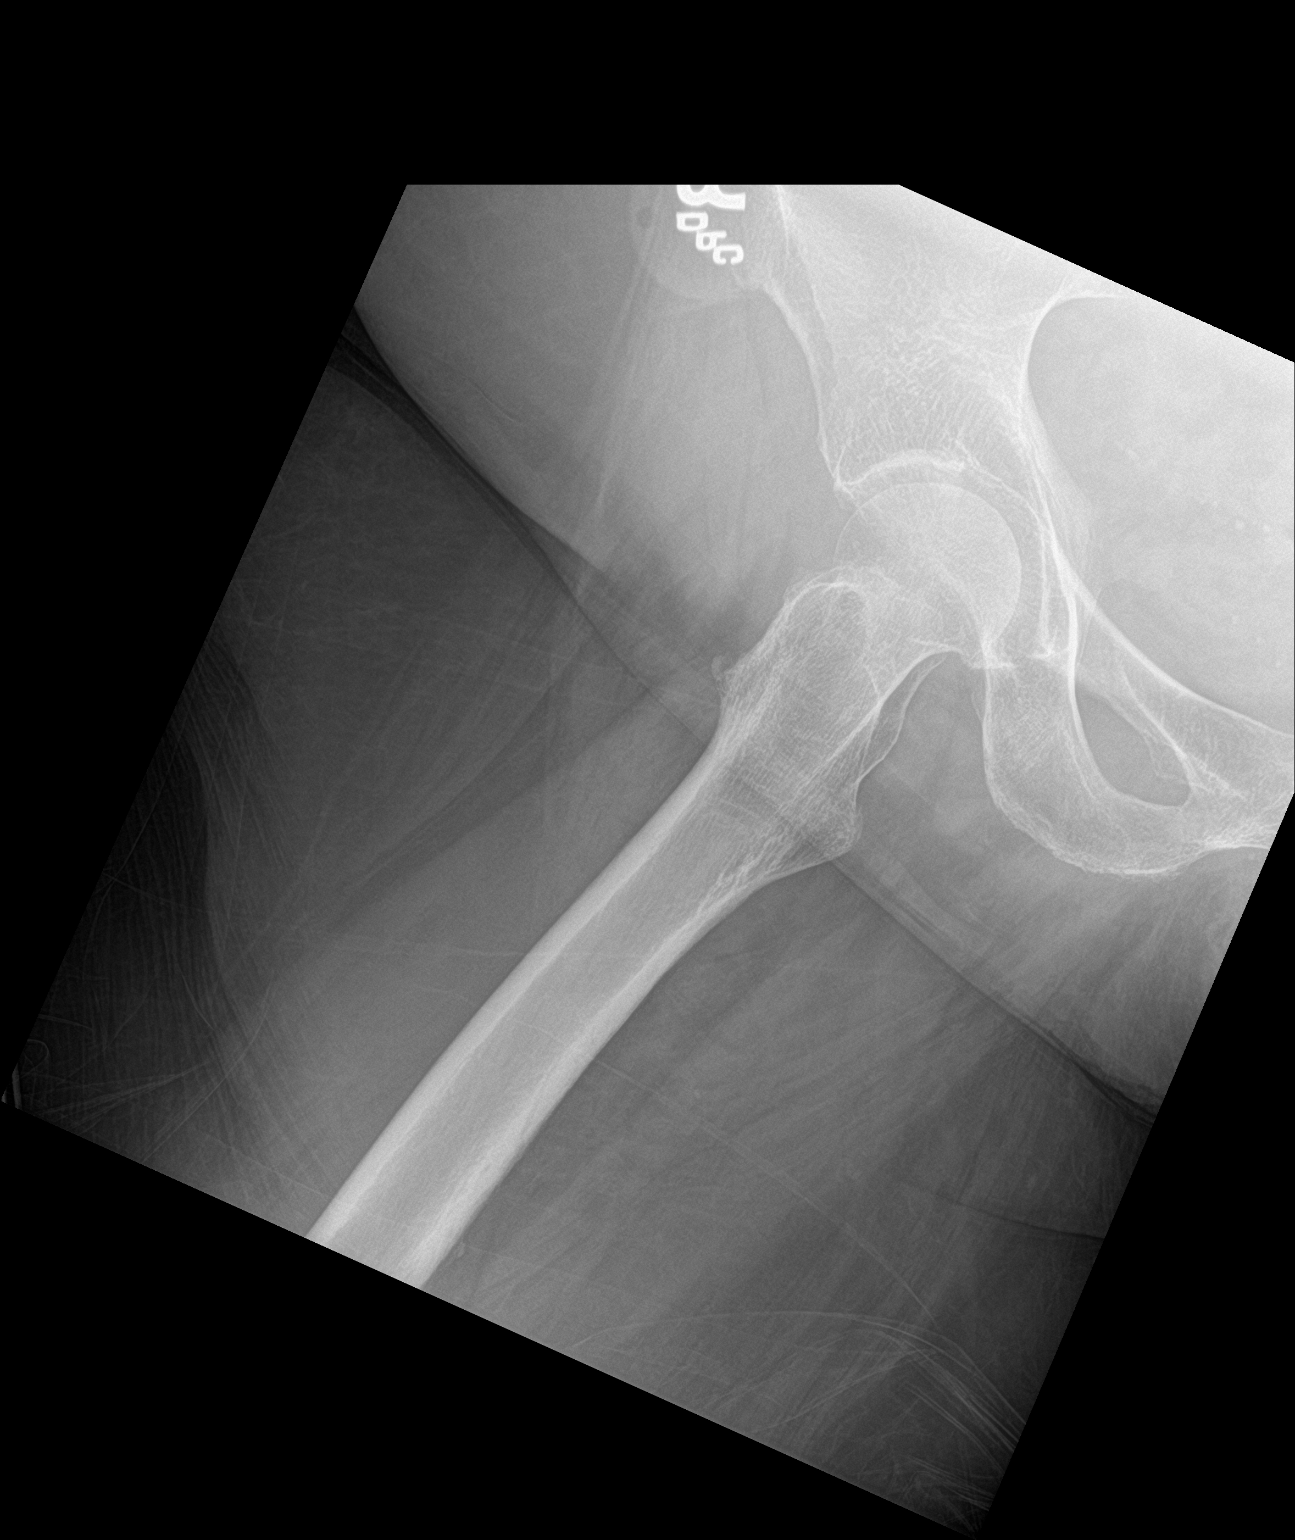

[3 of 3 positions shown; findings below may reference images not displayed]

FINDINGS: Mild cortical irregularity is noted in the subcapital femoral neck
with some increased density which may be related to a minimally
impacted fracture. The pelvic ring is otherwise within normal
limits. No sacral abnormality is seen. Degenerative changes of the
lumbar spine are noted.
IMPRESSION: Findings suspicious for subcapital femoral neck fracture. CT of the
pelvis may be helpful for further evaluation.

## 2021-04-20 IMAGING — DX DG FEMUR 2+V*R*
4 series · 4 of 4 positions shown · non-contrast
Comparison: None.

CLINICAL DATA: Recent trip and fall with right hip pain, initial
encounter

EXAM:
RIGHT FEMUR 2 VIEWS

[femur ap (1 of 2)]
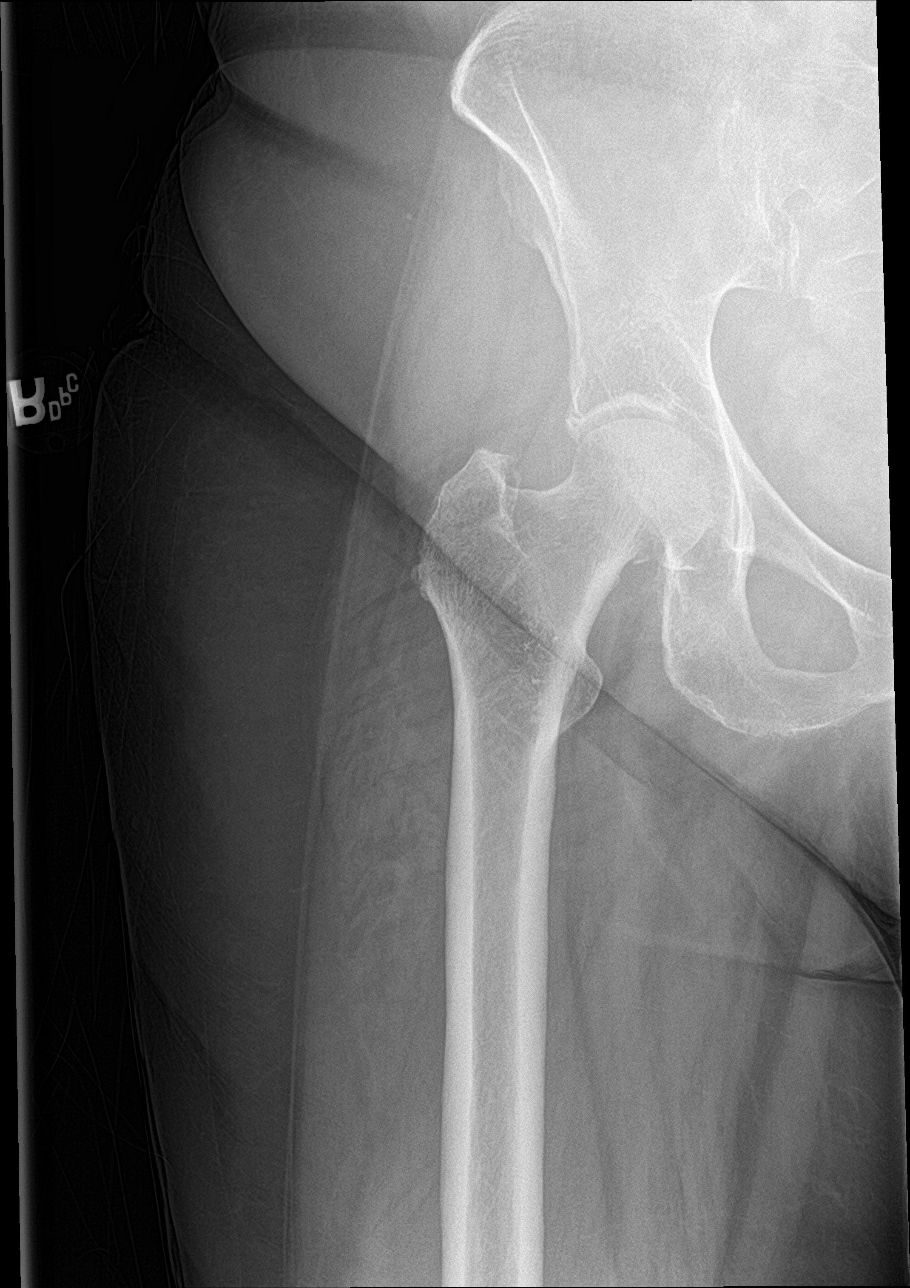

[femur ap (2 of 2)]
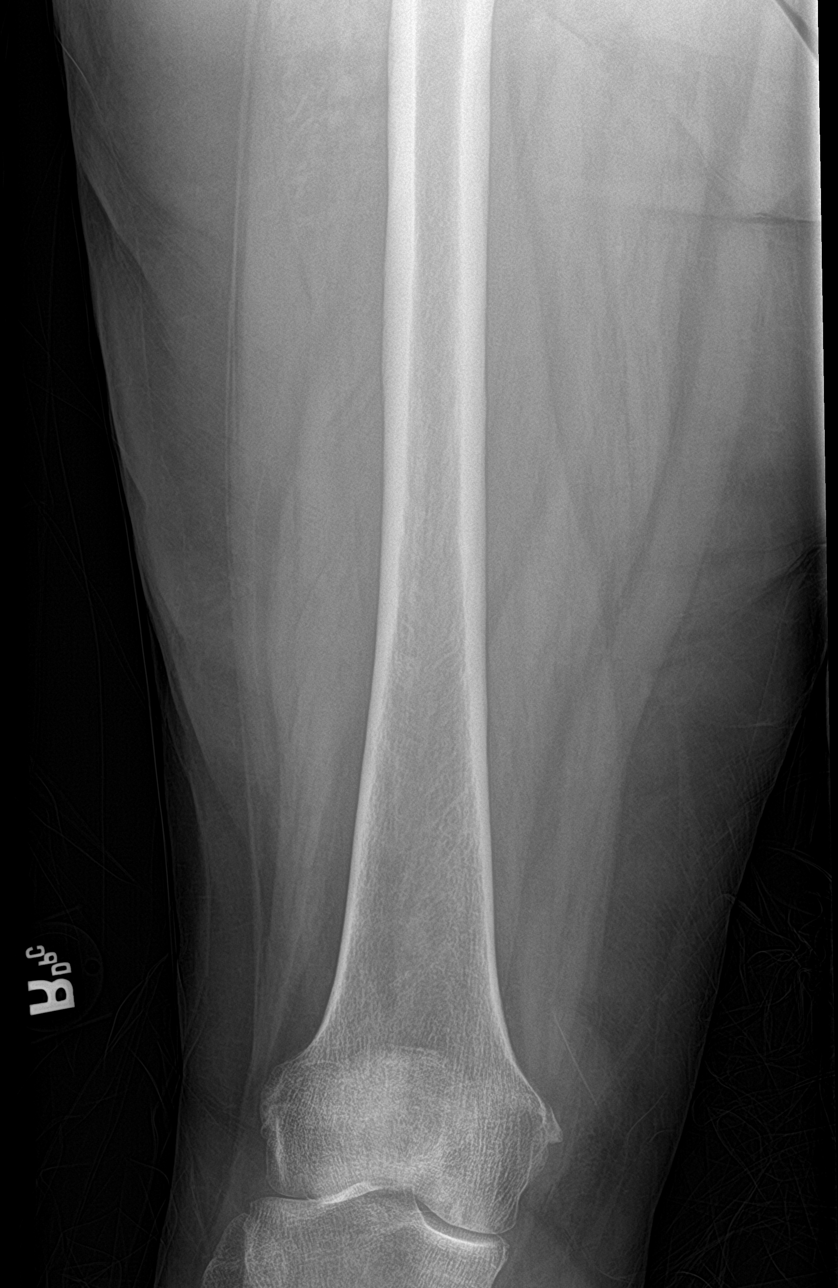

[femur lat (1 of 2)]
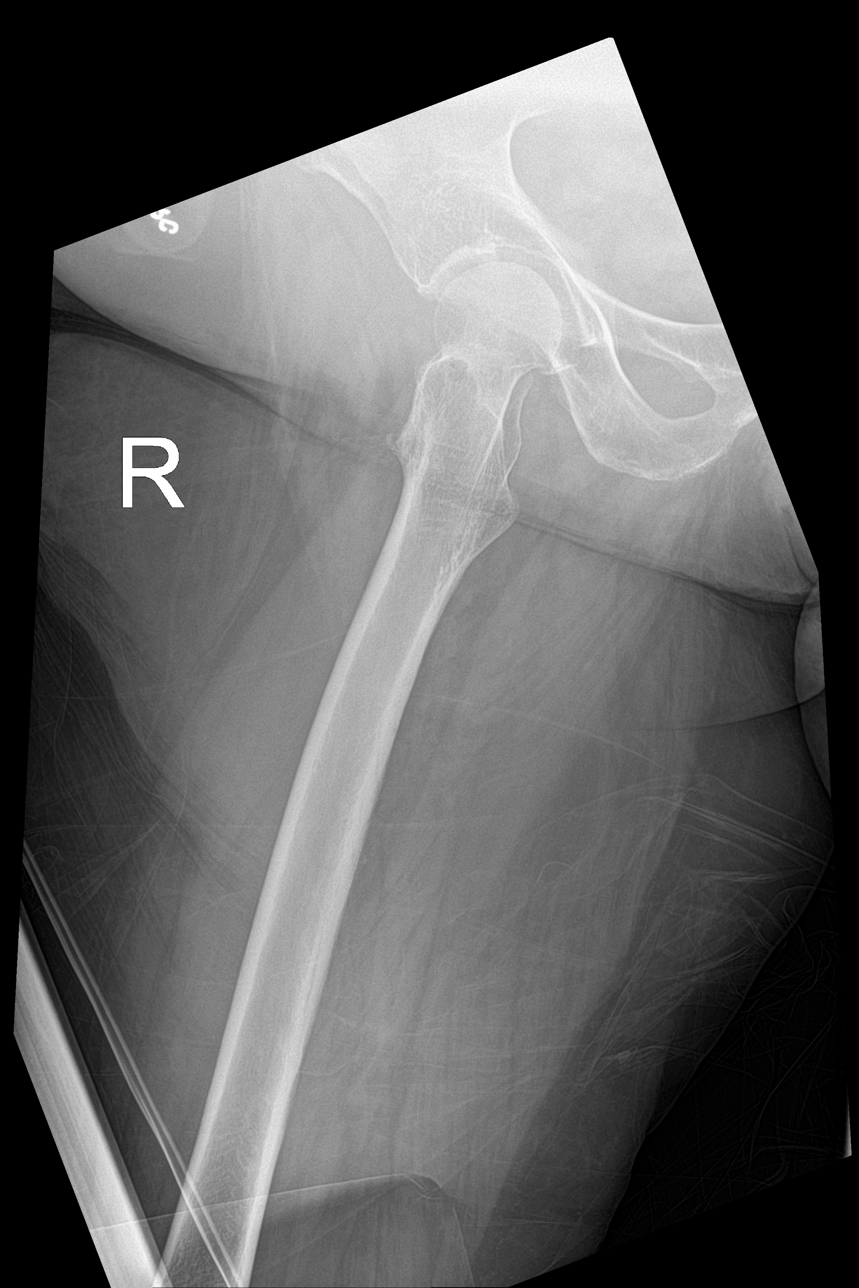

[femur lat (2 of 2)]
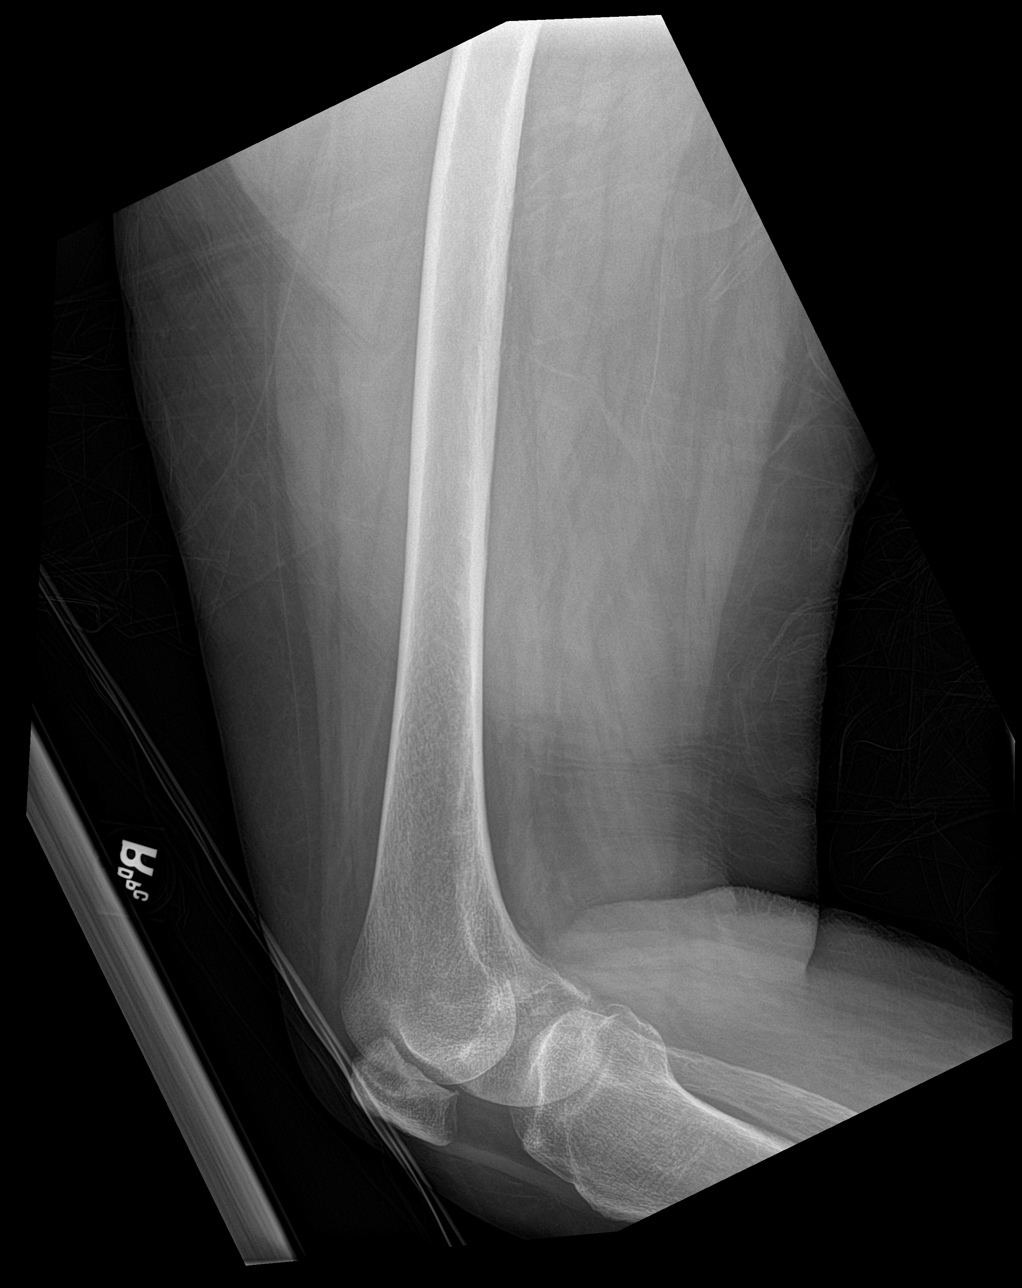

[4 of 4 positions shown; findings below may reference images not displayed]

FINDINGS: Irregularity a subcapital femoral neck is noted suspicious for
minimally impacted fracture. No other focal abnormality is noted. No
soft tissue changes are seen.
IMPRESSION: Findings suspicious for subcapital femoral neck fracture on the
right. CT is recommended for further evaluation.

## 2021-04-20 IMAGING — CT CT HIP*R* W/O CM
2 of 3 series · 17 of 46 positions shown, 19 images · non-contrast
Comparison: Radiographs from [DATE]

CLINICAL DATA: Fall yesterday injuring the right hip.

EXAM:
CT OF THE RIGHT HIP WITHOUT CONTRAST
TECHNIQUE: Multidetector CT imaging of the right hip was performed according to
the standard protocol. Multiplanar CT image reconstructions were
also generated.

[Series 5: 3 axial soft · axial · 0.50mm/px · z∈[-820,-636]mm · 14 of 106 slices shown, 16 images]
[im 7/106  soft-tissue]
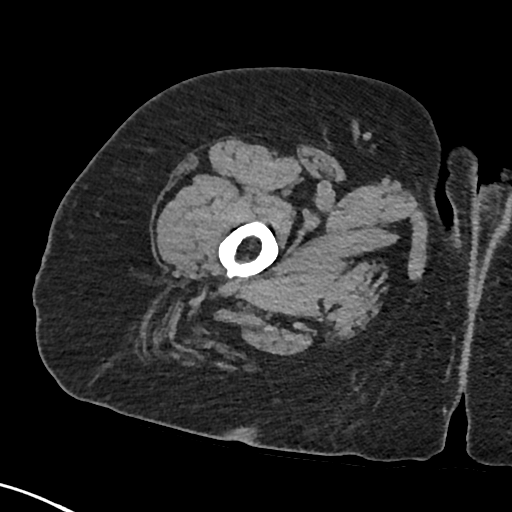
[im 7/106  bone]
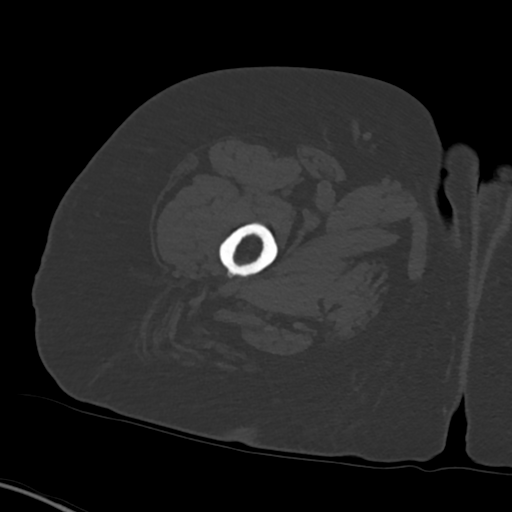
[im 14/106  soft-tissue]
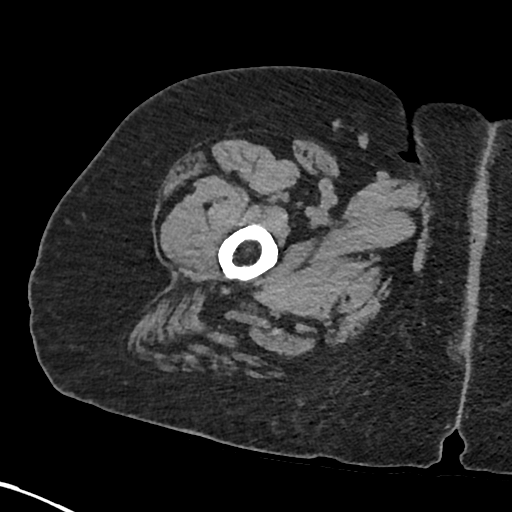
[im 21/106  soft-tissue]
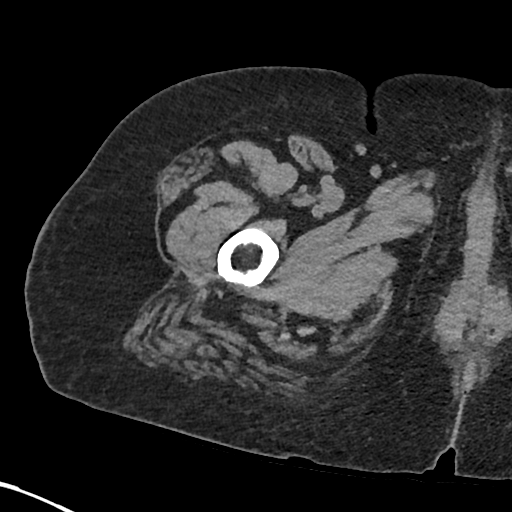
[im 28/106  soft-tissue]
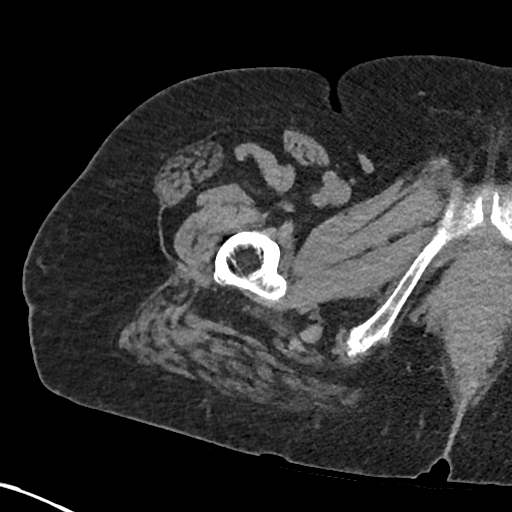
[im 34/106  soft-tissue]
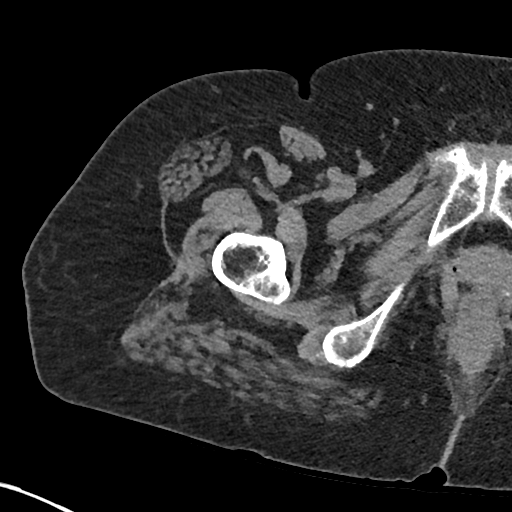
[im 41/106  soft-tissue]
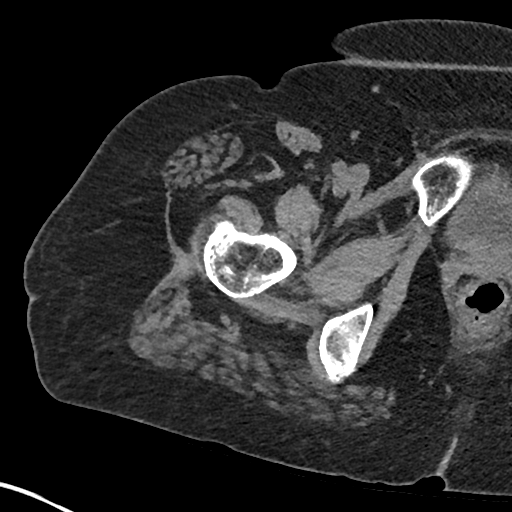
[im 48/106  soft-tissue]
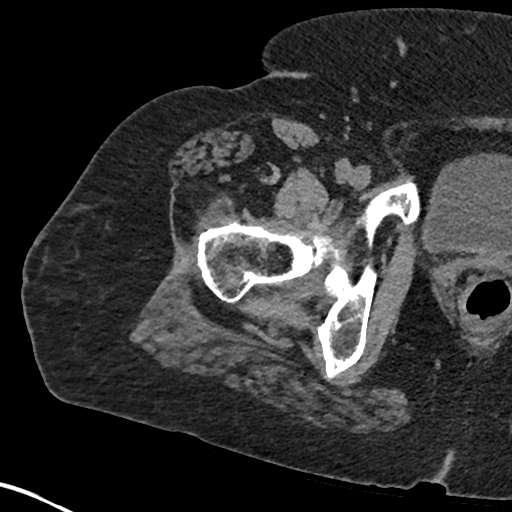
[im 58/106  soft-tissue]
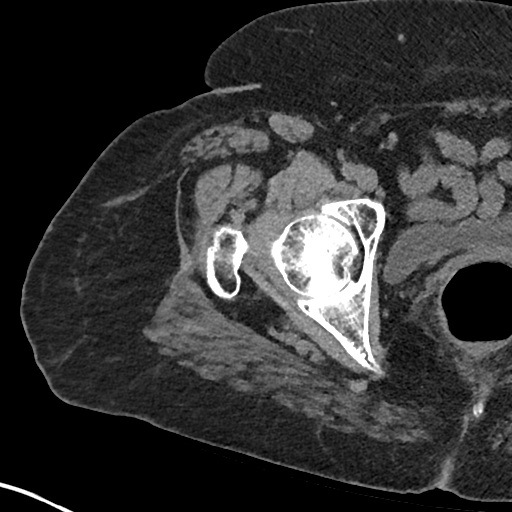
[im 65/106  soft-tissue]
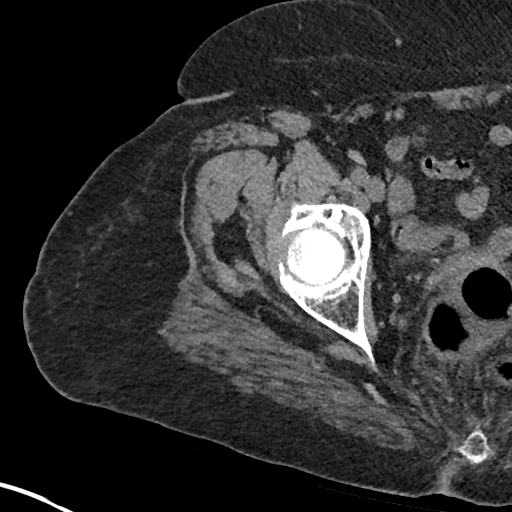
[im 65/106  bone]
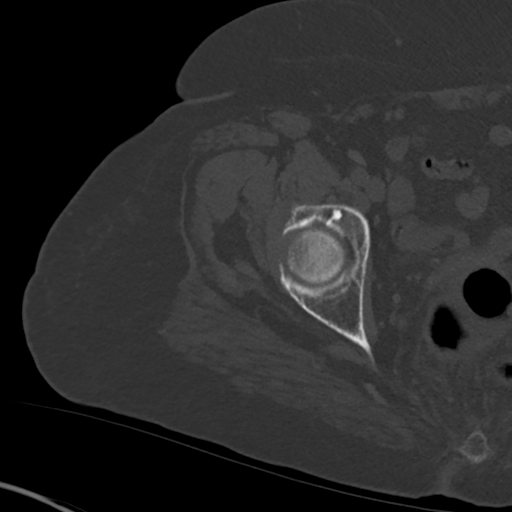
[im 72/106  soft-tissue]
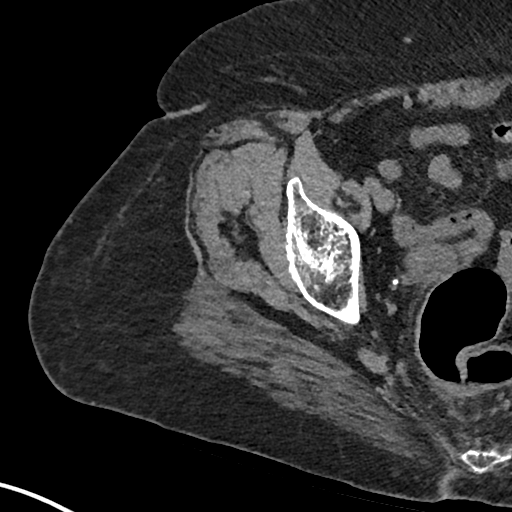
[im 78/106  soft-tissue]
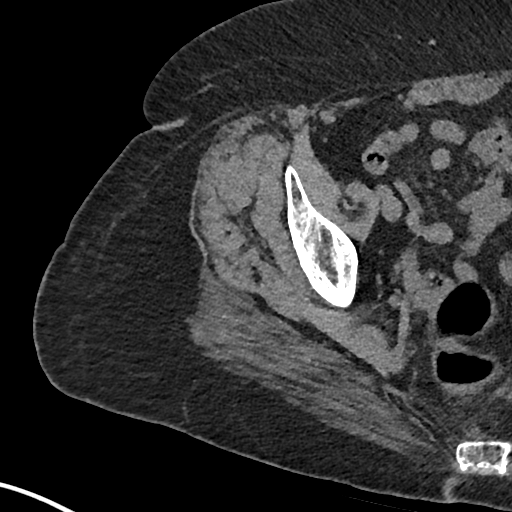
[im 85/106  soft-tissue]
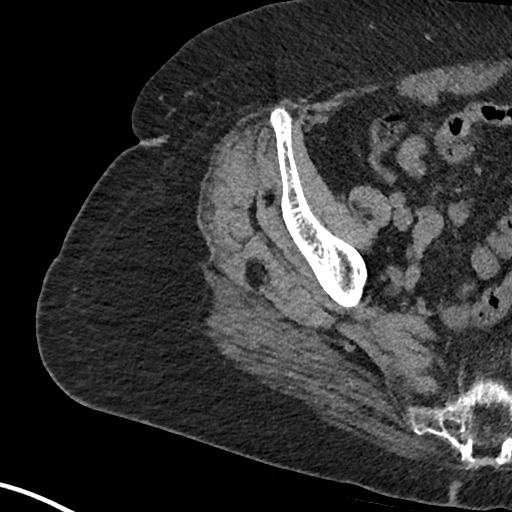
[im 92/106  soft-tissue]
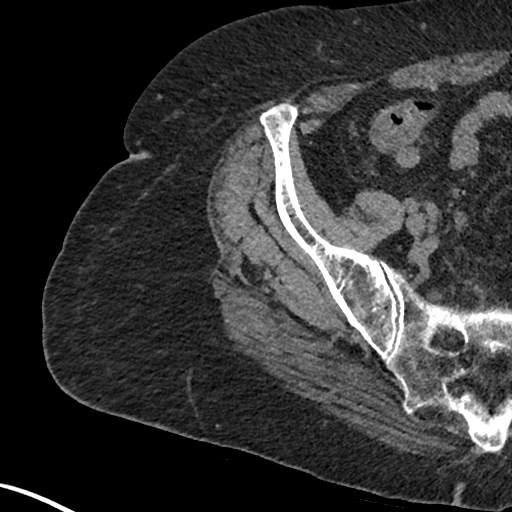
[im 99/106  soft-tissue]
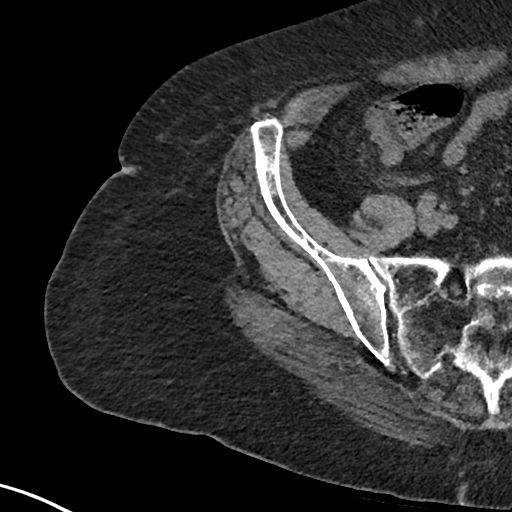

[Series 8: coronal st · coronal · 0.44mm/px · 3 of 132 slices shown]
[im 44/132  soft-tissue]
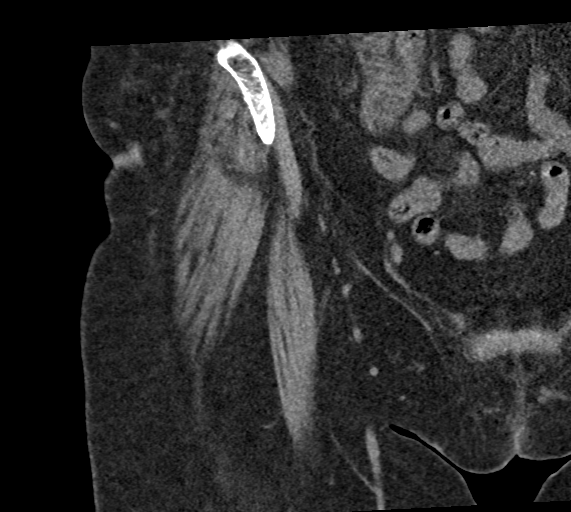
[im 59/132  soft-tissue]
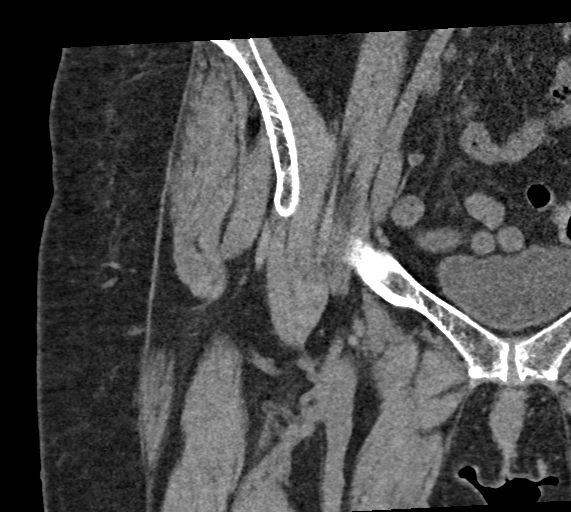
[im 73/132  soft-tissue]
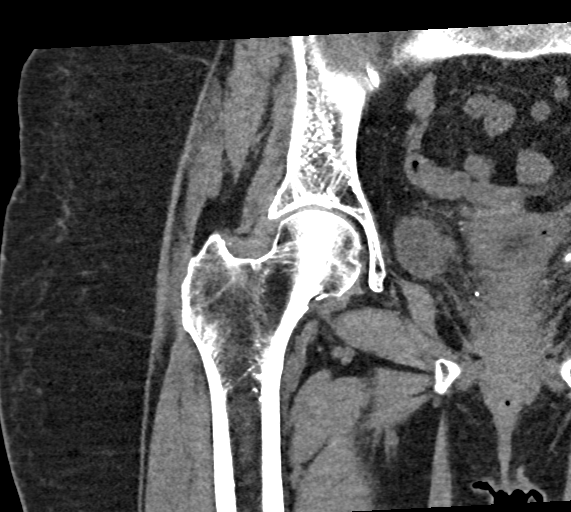

[17 of 46 positions shown; findings below may reference images not displayed]

FINDINGS: Bones/Joint/Cartilage

There is unusual spur like notching along the junction of the
femoral head and neck inferiorly as shown for example on images 38
through 46 of series 6, and accounting for the finding at
radiography. Although this does appear unusual for spurring, the
same appearance was present on [DATE] and accordingly this is
attributed to spurring and not to an acute fracture. No other high
suspicion region is identified along the left proximal femur or
regional pelvis

Ligaments

Suboptimally assessed by CT.

Muscles and Tendons

Unremarkable

Soft tissues

Unremarkable
IMPRESSION: 1. Unusual spur like notching at the junction of the left femoral
head and neck inferiorly, stable from [DATE]. No hip fracture is
identified. If the patient is unable to bear weight or if there is a
high clinical suspicion of occult fracture, MRI can provide higher
sensitivity.

## 2021-04-20 MED ORDER — MELOXICAM 7.5 MG PO TABS: 7.5000 mg | ORAL_TABLET | Freq: Two times a day (BID) | ORAL | 0 refills | Status: AC | PRN

## 2021-04-20 NOTE — Discharge Instructions (Signed)
Your examination and x-rays and CT scan showed no signs of broken bones.  You may take ibuprofen or Tylenol as needed, for something stronger you may try Mobic twice a day.  I have given you a prescription for this medication.  See your doctor within 1 week for recheck but be aware that she will likely have ongoing pain for the next week.

## 2021-04-20 NOTE — ED Provider Notes (Signed)
[facility] Provider Note   CSN: [id number] Arrival date & time: 04/20/21  1739     History No chief complaint on file.   Alyssa Pena is a 70 y.o. female.  HPI  This patient is a 70 year old female who states that yesterday she was sweeping some leaves around her yard when she tried to step on the steps, lost control of her leg and fell onto her buttock on the right side.  She was able to get up and walk but today has more pain in that leg which radiates from the hip down to the knee.  No pain in the back, no numbness or weakness, she comes by EMS transport for evaluation.  The patient was able to ambulate to and from the ambulance.  No head injury, no chest pain coughing or shortness of breath.  Symptoms are persistent and worse with range of motion of the hip  Past Medical History:  Diagnosis Date   Alcohol dependence ([place]) 09/23/8117   Alcoholic hepatitis 1/47/8295   Anxiety    Arthritis    Bruises easily    CHF (congestive heart failure) ([place])    Cirrhosis ([place]) 08/10/2013   Depression    Dysrhythmia    before medication   Fluid retention    Full dentures    upper   Hypertension    Portal venous hypertension ([place]) 08/10/2013   Renal disorder    Thrombocytopenia ([place]) 08/10/2013    Patient Active Problem List   Diagnosis Date Noted   Renal calculus 12/24/2018   Ureteral calculus, right    Benign essential HTN    AKI (acute kidney injury) ([place]) 10/16/2018   Hepatitis C antibody test positive 62/13/0865   Diastolic CHF ([place]) 78/46/9629   Pulmonary hypertension ([place]) 08/12/2013   Hypokalemia 08/11/2013   IV drug user history 08/11/2013   Cirrhosis ([place]) 52/84/1324   Alcoholic hepatitis 40/03/2724   Portal venous hypertension ([place]) 08/10/2013   Thrombocytopenia ([place]) 08/10/2013   Alcohol dependence ([place]) 08/10/2013    Past Surgical History:  Procedure Laterality Date   ABDOMINAL HYSTERECTOMY     BREAST SURGERY     lumpectomy benign    CATARACT EXTRACTION W/PHACO Left 08/30/2016   Procedure: CATARACT EXTRACTION PHACOEMULSIFICATION AND INTRAOCULAR LENS PLACEMENT LEFT EYE CDE - 46.93;  Surgeon: Baruch Goldmann, MD;  Location: [facility];  Service: Ophthalmology;  Laterality: Left;  left   CATARACT EXTRACTION W/PHACO Right 06/20/2017   Procedure: CATARACT EXTRACTION PHACO AND INTRAOCULAR LENS PLACEMENT RIGHT EYE;  Surgeon: Baruch Goldmann, MD;  Location: [facility];  Service: Ophthalmology;  Laterality: Right;  right   CYSTOSCOPY WITH RETROGRADE PYELOGRAM, URETEROSCOPY AND STENT PLACEMENT Right 10/17/2018   Procedure: CYSTOSCOPY WITH RETROGRADE PYELOGRAM, URETEROSCOPY AND STENT PLACEMENT;  Surgeon: Ardis Hughs, MD;  Location: [facility];  Service: Urology;  Laterality: Right;   CYSTOSCOPY/URETEROSCOPY/HOLMIUM LASER/STENT PLACEMENT N/A 01/04/2019   Procedure: CYSTOSCOPY/URETEROSCOPY/STENT PLACEMENT;  Surgeon: Raynelle Bring, MD;  [facility];  Service: Urology;  Laterality: N/A;   IR URETERAL STENT RIGHT NEW ACCESS W/O SEP NEPHROSTOMY CATH  12/24/2018   KIDNEY STONE SURGERY     NEPHROLITHOTOMY Right 12/24/2018   Procedure: NEPHROLITHOTOMY PERCUTANEOUS;  Surgeon: Raynelle Bring, MD;  [facility];  Service: Urology;  Laterality: Right;   URETEROSCOPY WITH HOLMIUM LASER LITHOTRIPSY Right 12/24/2018   Procedure: URETEROSCOPY;  Surgeon: Raynelle Bring, MD;  [facility];  Service: Urology;  Laterality: Right;     OB History   No obstetric history on  file.     Family History  Problem Relation Age of Onset   Lung cancer Father    Lung cancer Brother    COPD Brother    Dementia Mother    Arthritis Sister    High blood pressure Sister    COPD Sister     Social History   Tobacco Use   Smoking status: Former    Types: Cigarettes   Smokeless tobacco: Never  Vaping Use   Vaping Use: Never used  Substance Use Topics   Alcohol use: Not Currently   Drug use: Not Currently    Comment: younger years    Home Medications Prior to  Admission medications   Medication Sig Start Date End Date Taking? Authorizing Provider  albuterol (VENTOLIN HFA) 108 (90 Base) MCG/ACT inhaler Inhale 1-2 puffs into the lungs every 6 (six) hours as needed for wheezing or shortness of breath. 06/23/20  Yes Marney Setting, NP  carvedilol (COREG) 12.5 MG tablet Take 12.5 mg by mouth 2 (two) times daily. 04/21/18  Yes [provider]  famotidine (PEPCID) 20 MG tablet Take 20 mg by mouth daily. 11/22/20  Yes [provider]  FLUoxetine (PROZAC) 40 MG capsule Take 40 mg by mouth daily. 03/08/21  Yes [provider]  furosemide (LASIX) 20 MG tablet Take 20 mg by mouth daily. 05/06/19  Yes [provider]  meloxicam (MOBIC) 7.5 MG tablet Take 1 tablet (7.5 mg total) by mouth 2 (two) times daily as needed for up to 14 days for pain. 04/20/21 05/04/21 Yes Noemi Chapel, MD    Allergies    Penicillins and Tape  Review of Systems   Review of Systems  All other systems reviewed and are negative.  Physical Exam Updated Vital Signs Ht 1.651 m (5\' 5" )   Wt 79.4 kg   SpO2 94%   BMI 29.12 kg/m   Physical Exam Vitals and nursing note reviewed.  Constitutional:      General: She is not in acute distress.    Appearance: She is well-developed.  HENT:     Head: Normocephalic and atraumatic.     Mouth/Throat:     Pharynx: No oropharyngeal exudate.  Eyes:     General: No scleral icterus.       Right eye: No discharge.        Left eye: No discharge.     Conjunctiva/sclera: Conjunctivae normal.     Pupils: Pupils are equal, round, and reactive to light.  Neck:     Thyroid: No thyromegaly.     Vascular: No JVD.  Cardiovascular:     Rate and Rhythm: Normal rate and regular rhythm.     Heart sounds: Normal heart sounds. No murmur heard.   No friction rub. No gallop.  Pulmonary:     Effort: Pulmonary effort is normal. No respiratory distress.     Breath sounds: Normal breath sounds. No wheezing or rales.   Abdominal:     General: Bowel sounds are normal. There is no distension.     Palpations: Abdomen is soft. There is no mass.     Tenderness: There is no abdominal tenderness.  Musculoskeletal:        General: Tenderness present. Normal range of motion.     Cervical back: Normal range of motion and neck supple.     Comments: The patient's right hip is supple to passive range of motion though this does cause pain, she can straight leg raise but with significant pain in  the right hip rating into the right thigh.  All other joints are supple  Lymphadenopathy:     Cervical: No cervical adenopathy.  Skin:    General: Skin is warm and dry.     Findings: No erythema or rash.  Neurological:     Mental Status: She is alert.     Coordination: Coordination normal.  Psychiatric:        Behavior: Behavior normal.    ED Results / Procedures / Treatments   Labs (all labs ordered are listed, but only abnormal results are displayed) Labs Reviewed - No data to display  EKG None  Radiology CT Hip Right Wo Contrast  Result Date: 04/20/2021 CLINICAL DATA:  Fall yesterday injuring the right hip. EXAM: CT OF THE RIGHT HIP WITHOUT CONTRAST TECHNIQUE: Multidetector CT imaging of the right hip was performed according to the standard protocol. Multiplanar CT image reconstructions were also generated. COMPARISON:  Radiographs from 04/20/2021 FINDINGS: Bones/Joint/Cartilage There is unusual spur like notching along the junction of the femoral head and neck inferiorly as shown for example on images 38 through 46 of series 6, and accounting for the finding at radiography. Although this does appear unusual for spurring, the same appearance was present on 12/24/2018 and accordingly this is attributed to spurring and not to an acute fracture. No other high suspicion region is identified along the left proximal femur or regional pelvis Ligaments Suboptimally assessed by CT. Muscles and Tendons Unremarkable Soft tissues  Unremarkable IMPRESSION: 1. Unusual spur like notching at the junction of the left femoral head and neck inferiorly, stable from 12/24/2018. No hip fracture is identified. If the patient is unable to bear weight or if there is a high clinical suspicion of occult fracture, MRI can provide higher sensitivity. Electronically Signed   By: Van Clines M.D.   On: 04/20/2021 21:40   DG Hip Unilat W or Wo Pelvis 2-3 Views Right  Result Date: 04/20/2021 CLINICAL DATA:  Recent fall with right-sided hip pain, initial encounter EXAM: DG HIP (WITH OR WITHOUT PELVIS) 3V RIGHT COMPARISON:  None. FINDINGS: Mild cortical irregularity is noted in the subcapital femoral neck with some increased density which may be related to a minimally impacted fracture. The pelvic ring is otherwise within normal limits. No sacral abnormality is seen. Degenerative changes of the lumbar spine are noted. IMPRESSION: Findings suspicious for subcapital femoral neck fracture. CT of the pelvis may be helpful for further evaluation. Electronically Signed   By: Inez Catalina M.D.   On: 04/20/2021 19:28   DG Femur Min 2 Views Right  Result Date: 04/20/2021 CLINICAL DATA:  Recent trip and fall with right hip pain, initial encounter EXAM: RIGHT FEMUR 2 VIEWS COMPARISON:  None. FINDINGS: Irregularity a subcapital femoral neck is noted suspicious for minimally impacted fracture. No other focal abnormality is noted. No soft tissue changes are seen. IMPRESSION: Findings suspicious for subcapital femoral neck fracture on the right. CT is recommended for further evaluation. Electronically Signed   By: Inez Catalina M.D.   On: 04/20/2021 19:29    Procedures Procedures   Medications Ordered in ED Medications - No data to display  ED Course  I have reviewed the triage vital signs and the nursing notes.  Pertinent labs & imaging results that were available during my care of the patient were reviewed by me and considered in my medical decision  making (see chart for details).    MDM Rules/Calculators/A&P  Exam is remarkable for pain with range of motion of the hip, will evaluate with x-ray of femur and hip.  X-ray with questionable hip fracture, CT scan was ordered and confirms there is no obvious hip fracture there.  This is more consistent with the patient's ability to continue to ambulate.  She was prescribed an anti-inflammatory, she is stable for discharge  Final Clinical Impression(s) / ED Diagnoses Final diagnoses:  Contusion of right hip, initial encounter    Rx / DC Orders ED Discharge Orders          Ordered    meloxicam (MOBIC) 7.5 MG tablet  2 times daily PRN        04/20/21 2148             Noemi Chapel, MD 04/20/21 2149

## 2021-04-20 NOTE — ED Triage Notes (Addendum)
Pt states she was sweeping leaves in her yard yesterday when she fell over the step. Pt states she fell down landing on her right buttocks. Pt was able to get up and continue with her daily activities but when she went to bed started having pain to her right hip. Pt states the pain has progressively gotten worse today and now believes it to be broken. Wants it checked out. Of note patient was ambulatory when EMS arrived and was able to get on stretcher without difficulty.

## 2021-04-26 DIAGNOSIS — R197 Diarrhea, unspecified: Secondary | ICD-10-CM | POA: Diagnosis not present

## 2021-04-26 DIAGNOSIS — M25551 Pain in right hip: Secondary | ICD-10-CM | POA: Diagnosis not present

## 2021-05-01 ENCOUNTER — Other Ambulatory Visit: Payer: Self-pay

## 2021-05-01 ENCOUNTER — Ambulatory Visit (INDEPENDENT_AMBULATORY_CARE_PROVIDER_SITE_OTHER): Payer: Medicare Other | Admitting: Orthopaedic Surgery

## 2021-05-01 ENCOUNTER — Encounter: Payer: Self-pay | Admitting: Orthopaedic Surgery

## 2021-05-01 DIAGNOSIS — G8929 Other chronic pain: Secondary | ICD-10-CM | POA: Diagnosis not present

## 2021-05-01 DIAGNOSIS — M25561 Pain in right knee: Secondary | ICD-10-CM

## 2021-05-01 DIAGNOSIS — M25562 Pain in left knee: Secondary | ICD-10-CM

## 2021-05-01 MED ORDER — HYDROCODONE-ACETAMINOPHEN 5-325 MG PO TABS: ORAL_TABLET | ORAL | 0 refills | Status: DC

## 2021-05-01 NOTE — Progress Notes (Signed)
PROCEDURE NOTE:  The patient requests injections of the left knee , verbal consent was obtained.  The left knee was prepped appropriately after time out was performed.   Sterile technique was observed and injection of 1 cc of DepoMedrol 40 mg with several cc's of plain xylocaine. Anesthesia was provided by ethyl chloride and a 20-gauge needle was used to inject the knee area. The injection was tolerated well.  A band aid dressing was applied.  The patient was advised to apply ice later today and tomorrow to the injection sight as needed.  PROCEDURE NOTE:  The patient requests injections of the right knee , verbal consent was obtained.  The right knee was prepped appropriately after time out was performed.   Sterile technique was observed and injection of 1 cc of DepoMedrol 40mg  with several cc's of plain xylocaine. Anesthesia was provided by ethyl chloride and a 20-gauge needle was used to inject the knee area. The injection was tolerated well.  A band aid dressing was applied.  The patient was advised to apply ice later today and tomorrow to the injection sight as needed.   Encounter Diagnosis  Name Primary?   Chronic pain of both knees Yes   Return prn.  Call if any problem.  Precautions discussed.  Electronically Harris, MD 11/15/20222:24 PM

## 2021-05-26 ENCOUNTER — Other Ambulatory Visit: Payer: Self-pay

## 2021-05-26 ENCOUNTER — Emergency Department (HOSPITAL_COMMUNITY): Payer: Medicare Other

## 2021-05-26 ENCOUNTER — Emergency Department (HOSPITAL_COMMUNITY)
Admission: EM | Admit: 2021-05-26 | Discharge: 2021-05-26 | Disposition: A | Discharge status: Home / Self Care | Payer: Medicare Other | Attending: Emergency Medicine | Admitting: Emergency Medicine

## 2021-05-26 ENCOUNTER — Encounter (HOSPITAL_COMMUNITY): Payer: Self-pay | Admitting: *Deleted

## 2021-05-26 DIAGNOSIS — B349 Viral infection, unspecified: Secondary | ICD-10-CM | POA: Insufficient documentation

## 2021-05-26 DIAGNOSIS — Z87891 Personal history of nicotine dependence: Secondary | ICD-10-CM | POA: Diagnosis not present

## 2021-05-26 DIAGNOSIS — J45909 Unspecified asthma, uncomplicated: Secondary | ICD-10-CM | POA: Insufficient documentation

## 2021-05-26 DIAGNOSIS — Z79899 Other long term (current) drug therapy: Secondary | ICD-10-CM | POA: Diagnosis not present

## 2021-05-26 DIAGNOSIS — I110 Hypertensive heart disease with heart failure: Secondary | ICD-10-CM | POA: Insufficient documentation

## 2021-05-26 DIAGNOSIS — Z20822 Contact with and (suspected) exposure to covid-19: Secondary | ICD-10-CM | POA: Diagnosis not present

## 2021-05-26 DIAGNOSIS — R0602 Shortness of breath: Secondary | ICD-10-CM | POA: Diagnosis not present

## 2021-05-26 DIAGNOSIS — R059 Cough, unspecified: Secondary | ICD-10-CM | POA: Diagnosis not present

## 2021-05-26 DIAGNOSIS — I5030 Unspecified diastolic (congestive) heart failure: Secondary | ICD-10-CM | POA: Insufficient documentation

## 2021-05-26 LAB — COMPREHENSIVE METABOLIC PANEL
ALT: 19 U/L (ref 0–44)
AST: 33 U/L (ref 15–41)
Albumin: 2.5 g/dL — ABNORMAL LOW (ref 3.5–5.0)
Alkaline Phosphatase: 64 U/L (ref 38–126)
Anion gap: 6 (ref 5–15)
BUN: 10 mg/dL (ref 8–23)
CO2: 25 mmol/L (ref 22–32)
Calcium: 8.8 mg/dL — ABNORMAL LOW (ref 8.9–10.3)
Chloride: 109 mmol/L (ref 98–111)
Creatinine, Ser: 0.95 mg/dL (ref 0.44–1.00)
GFR, Estimated: >60 mL/min (ref >60)
Glucose, Bld: 107 mg/dL — ABNORMAL HIGH (ref 70–99)
Potassium: 3 mmol/L — ABNORMAL LOW (ref 3.5–5.1)
Sodium: 140 mmol/L (ref 135–145)
Total Bilirubin: 1.7 mg/dL — ABNORMAL HIGH (ref 0.3–1.2)
Total Protein: 7 g/dL (ref 6.5–8.1)

## 2021-05-26 LAB — CBC WITH DIFFERENTIAL/PLATELET
Abs Immature Granulocytes: 0.02 10*3/uL (ref 0.00–0.07)
Basophils Absolute: 0.1 10*3/uL (ref 0.0–0.1)
Basophils Relative: 1 %
Eosinophils Absolute: 0.2 10*3/uL (ref 0.0–0.5)
Eosinophils Relative: 3 %
HCT: 36.4 % (ref 36.0–46.0)
Hemoglobin: 12.4 g/dL (ref 12.0–15.0)
Immature Granulocytes: 0 %
Lymphocytes Relative: 32 %
Lymphs Abs: 1.6 10*3/uL (ref 0.7–4.0)
MCH: 32.3 pg (ref 26.0–34.0)
MCHC: 34.1 g/dL (ref 30.0–36.0)
MCV: 94.8 fL (ref 80.0–100.0)
Monocytes Absolute: 0.7 10*3/uL (ref 0.1–1.0)
Monocytes Relative: 13 %
Neutro Abs: 2.5 10*3/uL (ref 1.7–7.7)
Neutrophils Relative %: 51 %
Platelets: 105 10*3/uL — ABNORMAL LOW (ref 150–400)
RBC: 3.84 MIL/uL — ABNORMAL LOW (ref 3.87–5.11)
RDW: 15.4 % (ref 11.5–15.5)
WBC: 4.9 10*3/uL (ref 4.0–10.5)
nRBC: 0 % (ref 0.0–0.2)

## 2021-05-26 LAB — RESP PANEL BY RT-PCR (FLU A&B, COVID) ARPGX2
Influenza A by PCR: NEGATIVE
Influenza B by PCR: NEGATIVE
SARS Coronavirus 2 by RT PCR: NEGATIVE

## 2021-05-26 LAB — MAGNESIUM: Magnesium: 1.8 mg/dL (ref 1.7–2.4)

## 2021-05-26 IMAGING — DX DG CHEST 1V PORT
1 series · 1 of 1 positions shown · non-contrast
Comparison: [DATE]

CLINICAL DATA: Cough

Shortness of breath
EXAM:
PORTABLE CHEST 1 VIEW

[chest ap]
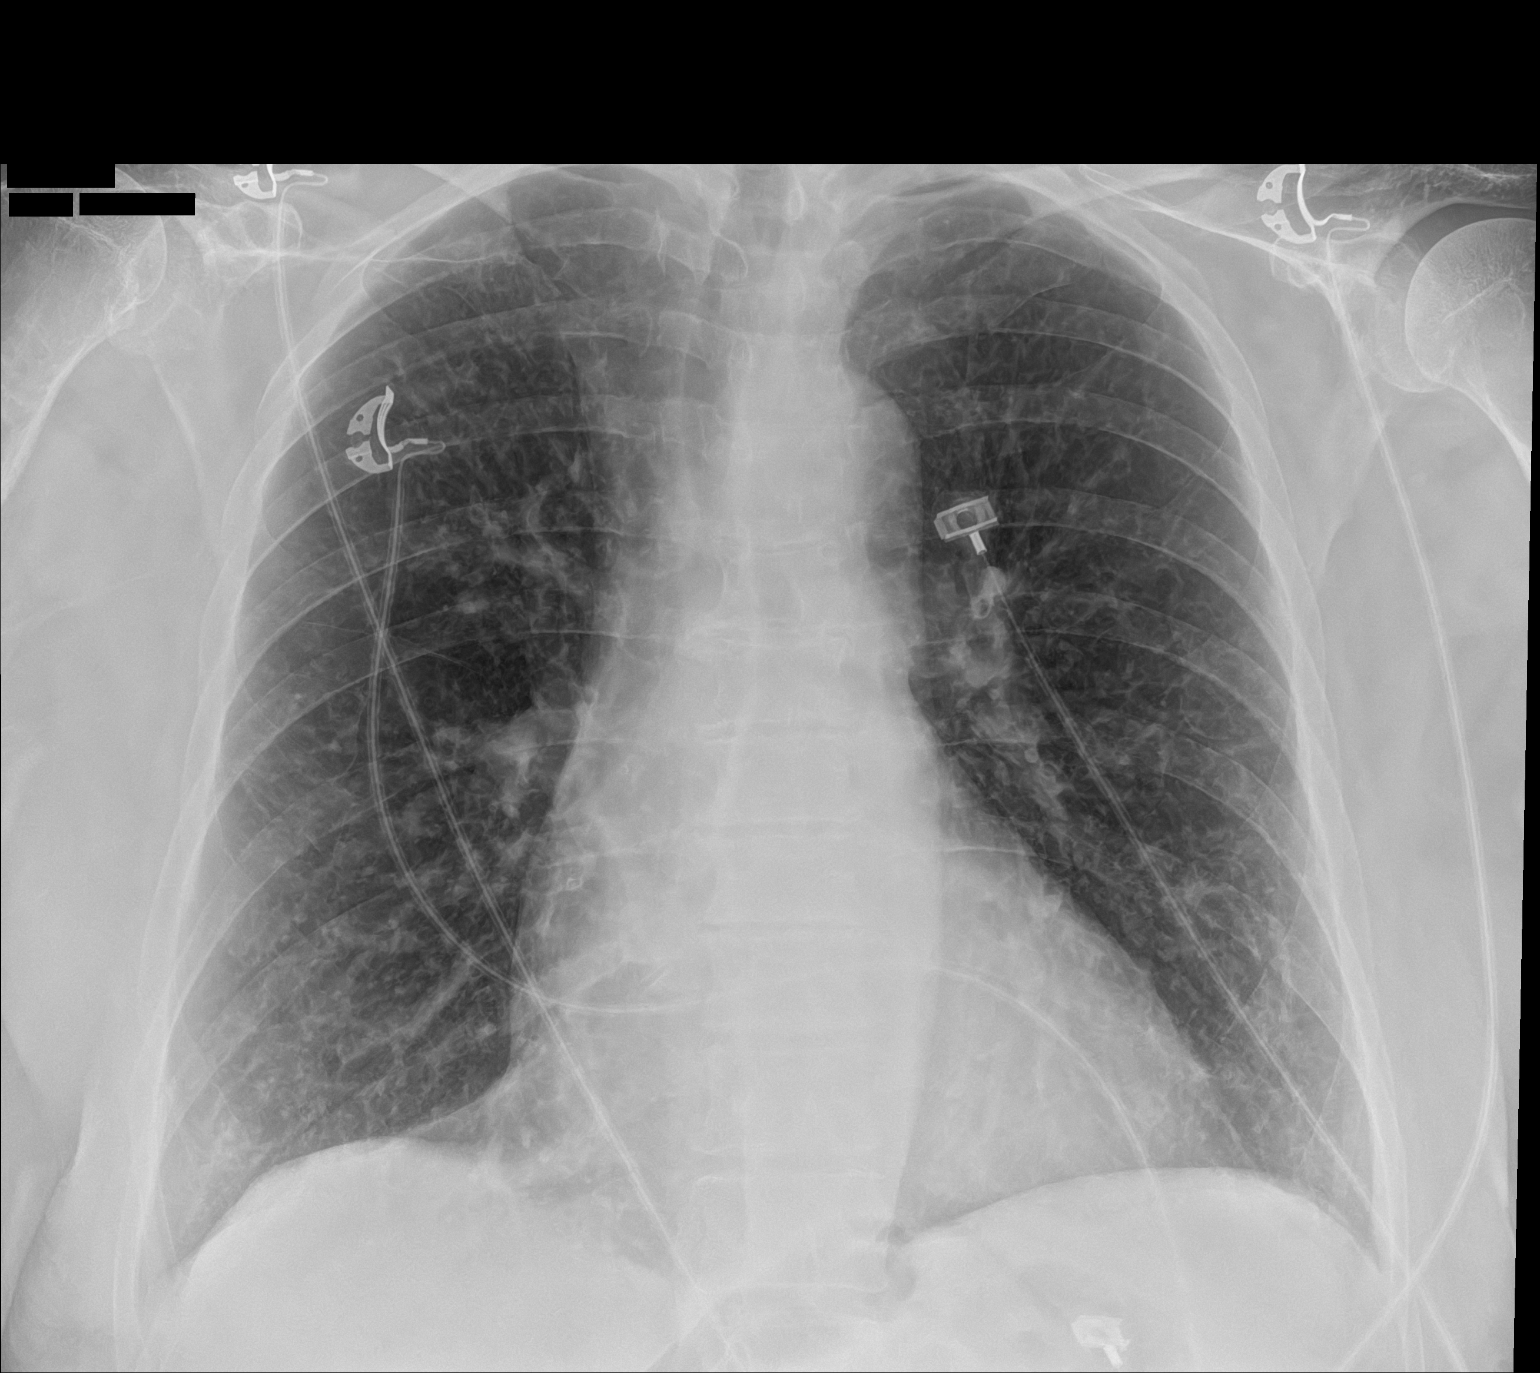

[1 of 1 positions shown; findings below may reference images not displayed]

FINDINGS: The heart size and mediastinal contours are within normal limits.
Both lungs are clear. The visualized skeletal structures are
unremarkable.
IMPRESSION: No active disease.

## 2021-05-26 MED ORDER — PREDNISONE 10 MG PO TABS: 30.0000 mg | ORAL_TABLET | Freq: Every day | ORAL | 0 refills | Status: DC

## 2021-05-26 MED ORDER — POTASSIUM CHLORIDE CRYS ER 20 MEQ PO TBCR
40.0000 meq | EXTENDED_RELEASE_TABLET | Freq: Once | ORAL | Status: AC
  Administered 2021-05-26: 40 meq via ORAL
  Filled 2021-05-26: qty 2

## 2021-05-26 MED ORDER — IPRATROPIUM-ALBUTEROL 0.5-2.5 (3) MG/3ML IN SOLN
3.0000 mL | Freq: Once | RESPIRATORY_TRACT | Status: AC
  Administered 2021-05-26: 3 mL via RESPIRATORY_TRACT
  Filled 2021-05-26: qty 3

## 2021-05-26 MED ORDER — DOXYCYCLINE HYCLATE 100 MG PO CAPS: 100.0000 mg | ORAL_CAPSULE | Freq: Two times a day (BID) | ORAL | 0 refills | Status: AC

## 2021-05-26 NOTE — ED Triage Notes (Signed)
Shortness of breath for 13 days

## 2021-05-26 NOTE — ED Provider Notes (Signed)
[facility] EMERGENCY DEPARTMENT Provider Note   CSN: [id number] Arrival date & time: 05/26/21  1240     History Chief Complaint  Patient presents with   Shortness of Alyssa Pena is a 70 y.o. female.  HPI  Patient with medical history including alcoholic hepatitis, CHF, cirrhosis, presents with complaints of shortness of breath.  Patient states that started 2 weeks ago, she endorses fevers and chills, nasal congestion, sore throat which describes as a raw like sensation, without difficulty eating or drinking no difficulty with swallowing, shortness of breath which she states is mainly on exertion, without pleuritic chest pain, actual chest pain, productive cough, as well as diarrhea and vomiting, with a decrease in appetite.  She states she still able to tolerate p.o.  She denies hematemesis or coffee-ground emesis, denies melena or hematochezia, she has no stomach pain.  Patient states that she is vaccine against COVID but has not gotten her flu vaccine, she has history of asthma and has 2 prescribed inhalers which she has been using without much relief, she has no other complaints this time.  She has no history of PEs or DVTs currently on hormone therapy, denies any leg swelling, denies any chest pain.  Past Medical History:  Diagnosis Date   Alcohol dependence ([place]) 6/94/8546   Alcoholic hepatitis 2/70/3500   Anxiety    Arthritis    Bruises easily    CHF (congestive heart failure) (HCC)    Cirrhosis ([place]) 08/10/2013   Depression    Dysrhythmia    before medication   Fluid retention    Full dentures    upper   Hypertension    Portal venous hypertension ([place]) 08/10/2013   Renal disorder    Thrombocytopenia ([place]) 08/10/2013    Patient Active Problem List   Diagnosis Date Noted   Renal calculus 12/24/2018   Ureteral calculus, right    Benign essential HTN    AKI (acute kidney injury) ([place]) 10/16/2018   Hepatitis C antibody test positive 93/81/8299   Diastolic  CHF ([place]) 37/16/9678   Pulmonary hypertension ([place]) 08/12/2013   Hypokalemia 08/11/2013   IV drug user history 08/11/2013   Cirrhosis ([place]) 93/81/0175   Alcoholic hepatitis 04/10/8526   Portal venous hypertension ([place]) 08/10/2013   Thrombocytopenia ([place]) 08/10/2013   Alcohol dependence ([place]) 08/10/2013    Past Surgical History:  Procedure Laterality Date   ABDOMINAL HYSTERECTOMY     BREAST SURGERY     lumpectomy benign   CATARACT EXTRACTION W/PHACO Left 08/30/2016   Procedure: CATARACT EXTRACTION PHACOEMULSIFICATION AND INTRAOCULAR LENS PLACEMENT LEFT EYE CDE - 46.93;  Surgeon: Baruch Goldmann, MD;  Location: [facility];  Service: Ophthalmology;  Laterality: Left;  left   CATARACT EXTRACTION W/PHACO Right 06/20/2017   Procedure: CATARACT EXTRACTION PHACO AND INTRAOCULAR LENS PLACEMENT RIGHT EYE;  Surgeon: Baruch Goldmann, MD;  Location: [facility];  Service: Ophthalmology;  Laterality: Right;  right   CYSTOSCOPY WITH RETROGRADE PYELOGRAM, URETEROSCOPY AND STENT PLACEMENT Right 10/17/2018   Procedure: CYSTOSCOPY WITH RETROGRADE PYELOGRAM, URETEROSCOPY AND STENT PLACEMENT;  Surgeon: Ardis Hughs, MD;  Location: [facility];  Service: Urology;  Laterality: Right;   CYSTOSCOPY/URETEROSCOPY/HOLMIUM LASER/STENT PLACEMENT N/A 01/04/2019   Procedure: CYSTOSCOPY/URETEROSCOPY/STENT PLACEMENT;  Surgeon: Raynelle Bring, MD;  Location: [facility];  Service: Urology;  Laterality: N/A;   IR URETERAL STENT RIGHT NEW ACCESS W/O SEP NEPHROSTOMY CATH  12/24/2018   KIDNEY STONE SURGERY     NEPHROLITHOTOMY Right 12/24/2018   Procedure: NEPHROLITHOTOMY PERCUTANEOUS;  Surgeon:  Raynelle Bring, MD;  Location: [facility];  Service: Urology;  Laterality: Right;   URETEROSCOPY WITH HOLMIUM LASER LITHOTRIPSY Right 12/24/2018   Procedure: URETEROSCOPY;  Surgeon: Raynelle Bring, MD;  Location: [facility];  Service: Urology;  Laterality: Right;     OB History   No obstetric history on file.     Family History  Problem Relation Age of Onset    Lung cancer Father    Lung cancer Brother    COPD Brother    Dementia Mother    Arthritis Sister    High blood pressure Sister    COPD Sister     Social History   Tobacco Use   Smoking status: Former    Types: Cigarettes   Smokeless tobacco: Never  Vaping Use   Vaping Use: Never used  Substance Use Topics   Alcohol use: Not Currently   Drug use: Not Currently    Comment: younger years    Home Medications Prior to Admission medications   Medication Sig Start Date End Date Taking? Authorizing Provider  doxycycline (VIBRAMYCIN) 100 MG capsule Take 1 capsule (100 mg total) by mouth 2 (two) times daily for 7 days. 05/26/21 06/02/21 Yes Marcello Fennel, PA-C  predniSONE (DELTASONE) 10 MG tablet Take 3 tablets (30 mg total) by mouth daily. 05/26/21  Yes Marcello Fennel, PA-C  albuterol (VENTOLIN HFA) 108 (90 Base) MCG/ACT inhaler Inhale 1-2 puffs into the lungs every 6 (six) hours as needed for wheezing or shortness of breath. 06/23/20   Marney Setting, NP  carvedilol (COREG) 12.5 MG tablet Take 12.5 mg by mouth 2 (two) times daily. 04/21/18   [provider]  famotidine (PEPCID) 20 MG tablet Take 20 mg by mouth daily. 11/22/20   [provider]  FLUoxetine (PROZAC) 40 MG capsule Take 40 mg by mouth daily. 03/08/21   [provider]  furosemide (LASIX) 20 MG tablet Take 20 mg by mouth daily. 05/06/19   [provider]  HYDROcodone-acetaminophen (NORCO/VICODIN) 5-325 MG tablet One tablet every six hours for pain.  Limit 7 days. 05/01/21   Sanjuana Kava, MD    Allergies    Penicillins and Tape  Review of Systems   Review of Systems  Constitutional:  Positive for appetite change, chills, fatigue and fever.  HENT:  Positive for congestion and sore throat. Negative for tinnitus and trouble swallowing.   Respiratory:  Positive for cough and shortness of breath.   Cardiovascular:  Negative for chest pain.  Gastrointestinal:  Positive for  diarrhea and vomiting. Negative for abdominal pain.  Genitourinary:  Negative for enuresis.  Musculoskeletal:  Positive for myalgias.  Skin:  Negative for rash.  Neurological:  Negative for dizziness and headaches.  Hematological:  Does not bruise/bleed easily.   Physical Exam Updated Vital Signs BP 104/77   Pulse 63   Temp 98.6 F (37 C) (Oral)   Resp 14   Ht 5\' 5"  (1.651 m)   Wt 83.9 kg   SpO2 97%   BMI 30.79 kg/m   Physical Exam Vitals and nursing note reviewed.  Constitutional:      General: She is not in acute distress.    Appearance: She is not ill-appearing.  HENT:     Head: Normocephalic and atraumatic.     Nose: Congestion present.     Mouth/Throat:     Mouth: Mucous membranes are moist.     Pharynx: No oropharyngeal exudate or posterior oropharyngeal erythema.     Comments: Cobblestoning noted  in the posterior pharynx. Eyes:     Conjunctiva/sclera: Conjunctivae normal.  Cardiovascular:     Rate and Rhythm: Normal rate and regular rhythm.     Pulses: Normal pulses.     Heart sounds: No murmur heard.   No friction rub. No gallop.  Pulmonary:     Effort: No respiratory distress.     Breath sounds: No wheezing, rhonchi or rales.     Comments: No signs of respiratory distress, nontachypneic, nonhypoxic,  while on room air, able speak in full sentences, she has slightly tight sounding chest with intermittent wheezing heard in the upper lobes, there are no Rales, stridor or rhonchi present. Abdominal:     Palpations: Abdomen is soft.     Tenderness: There is no abdominal tenderness. There is no right CVA tenderness or left CVA tenderness.  Musculoskeletal:     Right lower leg: No edema.     Left lower leg: No edema.  Skin:    General: Skin is warm and dry.  Neurological:     Mental Status: She is alert.  Psychiatric:        Mood and Affect: Mood normal.    ED Results / Procedures / Treatments   Labs (all labs ordered are listed, but only abnormal results  are displayed) Labs Reviewed  COMPREHENSIVE METABOLIC PANEL - Abnormal; Notable for the following components:      Result Value   Potassium 3.0 (*)    Glucose, Bld 107 (*)    Calcium 8.8 (*)    Albumin 2.5 (*)    Total Bilirubin 1.7 (*)    All other components within normal limits  CBC WITH DIFFERENTIAL/PLATELET - Abnormal; Notable for the following components:   RBC 3.84 (*)    Platelets 105 (*)    All other components within normal limits  RESP PANEL BY RT-PCR (FLU A&B, COVID) [id number]  MAGNESIUM    EKG EKG Interpretation  Date/Time:  Saturday May 26 2021 13:10:12 EST Ventricular Rate:  67 PR Interval:  229 QRS Duration: 100 QT Interval:  440 QTC Calculation: 465 R Axis:   3 Text Interpretation: Sinus rhythm Prolonged PR interval Low voltage, precordial leads Probable anteroseptal infarct, old Borderline repolarization abnormality Since last tracing Rate faster Otherwise no significant change Confirmed by Calvert Cantor [id number]) on 05/26/2021 2:07:09 PM  Radiology DG Chest Port 1 View  Result Date: 05/26/2021 CLINICAL DATA:  Cough Shortness of breath EXAM: PORTABLE CHEST 1 VIEW COMPARISON:  06/23/2020 FINDINGS: The heart size and mediastinal contours are within normal limits. Both lungs are clear. The visualized skeletal structures are unremarkable. IMPRESSION: No active disease. Electronically Signed   By: Miachel Roux M.D.   On: 05/26/2021 14:15    Procedures Procedures   Medications Ordered in ED Medications  ipratropium-albuterol (DUONEB) 0.5-2.5 (3) MG/3ML nebulizer solution 3 mL (3 mLs Nebulization Given 05/26/21 1427)  potassium chloride SA (KLOR-CON M) CR tablet 40 mEq (40 mEq Oral Given 05/26/21 1439)    ED Course  I have reviewed the triage vital signs and the nursing notes.  Pertinent labs & imaging results that were available during my care of the patient were reviewed by me and considered in my medical decision making (see chart for details).     MDM Rules/Calculators/A&P                          Initial impression-presented with shortness of breath, alert, no acute distress, vital signs reassuring.  Likely this is a viral infection, will obtain basic lab work-up, chest x-ray, provide with DuoNeb and reassess.  Work-up-CBC unremarkable, CMP shows potassium 3.0, glucose of 107, calcium 8.8, T bili 1.7, EKG sinus without signs of ischemia.  Reassessment-patient's potassium is slightly low, will provide her with supplemental potassium at this time.  This is likely secondary due to her Lasix use.  Reassess after DuoNeb states she is feeling much better, lung sounds are reassessed only intermittent wheezing present, lungs have improved.  Patient states that she is ready for discharge.  Rule out- Low suspicion for systemic infection as patient is nontoxic-appearing, vital signs reassuring, no obvious source infection noted on exam.  Low suspicion for pneumonia as lung sounds are clear bilaterally, x-ray did not reveal any acute findings.  I have low suspicion for PE as patient denies pleuritic chest pain, shortness of breath, nontachypneic, nonhypoxic, nontachycardic presentation more consistent with viral URI. low suspicion for strep throat as oropharynx was visualized, no erythema or exudates noted.  Low suspicion patient would need  hospitalized due to viral infection or Covid as vital signs reassuring, patient is not in respiratory distress.  Low suspicion for ACS as patient denies chest pain, EKG without signs of ischemia.   Plan-  URI-likely this is viral nature but since she has had this for 2 weeks time increase likelihood this could turn into bacterial, will start her on a doxycycline, steroids due to the wheezing and have her follow-up with her PCP for further evaluation.  Gave strict return precautions.  Vital signs have remained stable, no indication for hospital admission.   Patient given at home care as well strict return  precautions.  Patient verbalized that they understood agreed to said plan.  Final Clinical Impression(s) / ED Diagnoses Final diagnoses:  Viral illness    Rx / DC Orders ED Discharge Orders          Ordered    predniSONE (DELTASONE) 10 MG tablet  Daily        05/26/21 1534    doxycycline (VIBRAMYCIN) 100 MG capsule  2 times daily        05/26/21 1534             Aron Baba 05/26/21 1536    Truddie Hidden, MD 05/27/21 1104

## 2021-05-26 NOTE — Discharge Instructions (Signed)
Likely a viral infection, recommend over-the-counter pain medications like ibuprofen Tylenol for fever and pain control, nasal decongestions like Flonase and Zyrtec, Mucinex for cough.  If not eating recommend supplementing with Gatorade to help with electrolyte supplementation.    Also started you on antibiotics as well as steroids please take as prescribed.  Please use your inhalers as needed as well as staying in moist air as this will help with your breathing.  Follow-up PCP for further evaluation if symptoms have not resolved after a week's time  Come back to the emergency department if you develop chest pain, shortness of breath, severe abdominal pain, uncontrolled nausea, vomiting, diarrhea.

## 2021-05-26 NOTE — ED Notes (Signed)
Put pt in gown and on  12 lead monitor

## 2021-06-14 DIAGNOSIS — K219 Gastro-esophageal reflux disease without esophagitis: Secondary | ICD-10-CM | POA: Diagnosis not present

## 2021-06-14 DIAGNOSIS — G4700 Insomnia, unspecified: Secondary | ICD-10-CM | POA: Diagnosis not present

## 2021-06-14 DIAGNOSIS — I5032 Chronic diastolic (congestive) heart failure: Secondary | ICD-10-CM | POA: Diagnosis not present

## 2021-06-15 DIAGNOSIS — G4700 Insomnia, unspecified: Secondary | ICD-10-CM | POA: Diagnosis not present

## 2021-06-15 DIAGNOSIS — K219 Gastro-esophageal reflux disease without esophagitis: Secondary | ICD-10-CM | POA: Diagnosis not present

## 2021-06-15 DIAGNOSIS — I5032 Chronic diastolic (congestive) heart failure: Secondary | ICD-10-CM | POA: Diagnosis not present

## 2021-09-04 DIAGNOSIS — K219 Gastro-esophageal reflux disease without esophagitis: Secondary | ICD-10-CM | POA: Diagnosis not present

## 2021-09-04 DIAGNOSIS — K8070 Calculus of gallbladder and bile duct without cholecystitis without obstruction: Secondary | ICD-10-CM | POA: Diagnosis not present

## 2021-09-04 DIAGNOSIS — G4700 Insomnia, unspecified: Secondary | ICD-10-CM | POA: Diagnosis not present

## 2021-09-04 DIAGNOSIS — I5032 Chronic diastolic (congestive) heart failure: Secondary | ICD-10-CM | POA: Diagnosis not present

## 2021-09-14 DIAGNOSIS — R1011 Right upper quadrant pain: Secondary | ICD-10-CM | POA: Diagnosis not present

## 2021-09-14 DIAGNOSIS — K8070 Calculus of gallbladder and bile duct without cholecystitis without obstruction: Secondary | ICD-10-CM | POA: Diagnosis not present

## 2021-09-14 DIAGNOSIS — K7460 Unspecified cirrhosis of liver: Secondary | ICD-10-CM | POA: Diagnosis not present

## 2021-09-14 DIAGNOSIS — K8020 Calculus of gallbladder without cholecystitis without obstruction: Secondary | ICD-10-CM | POA: Diagnosis not present

## 2021-09-25 ENCOUNTER — Ambulatory Visit (INDEPENDENT_AMBULATORY_CARE_PROVIDER_SITE_OTHER): Payer: Medicare Other | Admitting: Internal Medicine

## 2021-10-09 ENCOUNTER — Encounter (INDEPENDENT_AMBULATORY_CARE_PROVIDER_SITE_OTHER): Payer: Self-pay | Admitting: Internal Medicine

## 2021-10-09 ENCOUNTER — Ambulatory Visit (INDEPENDENT_AMBULATORY_CARE_PROVIDER_SITE_OTHER): Payer: Medicare Other | Admitting: Internal Medicine

## 2021-10-09 VITALS — BP 105/65 | HR 64 | Temp 97.4°F | Ht 65.0 in | Wt 173.2 lb

## 2021-10-09 DIAGNOSIS — K838 Other specified diseases of biliary tract: Secondary | ICD-10-CM | POA: Diagnosis not present

## 2021-10-09 DIAGNOSIS — R768 Other specified abnormal immunological findings in serum: Secondary | ICD-10-CM | POA: Diagnosis not present

## 2021-10-09 DIAGNOSIS — R1011 Right upper quadrant pain: Secondary | ICD-10-CM

## 2021-10-09 DIAGNOSIS — K7030 Alcoholic cirrhosis of liver without ascites: Secondary | ICD-10-CM | POA: Diagnosis not present

## 2021-10-09 DIAGNOSIS — R159 Full incontinence of feces: Secondary | ICD-10-CM

## 2021-10-09 MED ORDER — METAMUCIL SMOOTH TEXTURE 58.6 % PO POWD
1.0000 | Freq: Every day | ORAL | Status: DC
Start: 2021-10-09 — End: 2023-08-11

## 2021-10-09 NOTE — Patient Instructions (Signed)
Take dicyclomine 10 mg by mouth 30 minutes before breakfast daily ?Metamucil 1 heaping tablespoonful daily by mouth at bedtime ?Can decrease furosemide to 20 mg every other day.  However if weight increases by 5 pounds can go back to taking it daily. ? ?Please check your weight twice a week. ?

## 2021-10-09 NOTE — Progress Notes (Signed)
Reason for consultation ? ?Epigastric/right upper quadrant pain cholelithiasis and dilated bile duct. ?Patient also complains of diarrhea and fecal incontinence. ? ?History of present illness ? ?Patient is 71 year old Caucasian female who is referred through courtesy of Dr. Blanch Media for consideration of ERCP. ?Patient has been experiencing intermittent epigastric pain radiating to right upper quadrant and posteriorly for the last 1 month.  She has intermittent nausea and has had few episodes of emesis.  She underwent ultrasound by Dr. Sherrie Sport on 09/14/2021.  This study revealed hepatic cirrhosis small right hepatic cyst thrombus in the portal vein felt to be chronic.  This study also revealed cholelithiasis without gallbladder wall thickening or pericholecystic fluid but bile duct was dilated measuring 10 mm.  No stones were evident.   ?Patient states pain occurs every couple of days.  She has chills but no fever.  Pain may last for about an hour each time.  She says her appetite is good.  However she has lost 37 pounds over a period of several months.  Her Sister Mardene Celeste who is accompanying her today states that she has lost close to 15 pounds in the last 4 months which she believes may be due to her taking fluid pill.  Patient has history of GERD.  She says heartburn is well controlled with PPI.  She denies dysphagia. ?Patient also complains of diarrhea and incontinence.  No history of melena or rectal bleeding.  She had stool cultures which were negative.  She took dicyclomine but became constipated and therefore stopped this medication.  She has never undergone colonoscopy. ? ?Patient was diagnosed with cirrhosis back in February 2015 when she was admitted to [facility] with jaundice.  She was also found to have positive HCV antibody.  My office contacted patient on few occasions but she declined to return for follow-up visit. ? ?Patient has not had any alcohol since August 2021.  She has been drinking  alcohol for 35 years.  Initially used to be beer and then she began to drink a pint of liquor every day.  She has never smoked cigarettes. ? ? ? ?Current Medications: ?Outpatient Encounter Medications as of 10/09/2021  ?Medication Sig  ? albuterol (VENTOLIN HFA) 108 (90 Base) MCG/ACT inhaler Inhale 1-2 puffs into the lungs every 6 (six) hours as needed for wheezing or shortness of breath.  ? benazepril (LOTENSIN) 20 MG tablet Take 20 mg by mouth daily.  ? carvedilol (COREG) 12.5 MG tablet Take 12.5 mg by mouth 2 (two) times daily.  ? dicyclomine (BENTYL) 10 MG/5ML solution Take 10 mg by mouth 3 (three) times daily before meals.  ? FLUoxetine (PROZAC) 40 MG capsule Take 40 mg by mouth daily.  ? furosemide (LASIX) 20 MG tablet Take 20 mg by mouth daily.  ? omeprazole (PRILOSEC) 20 MG capsule Take 20 mg by mouth daily.  ? ondansetron (ZOFRAN-ODT) 4 MG disintegrating tablet Take 4 mg by mouth every 4 (four) hours as needed for nausea or vomiting.  ? famotidine (PEPCID) 20 MG tablet Take 20 mg by mouth daily. (Patient not taking: Reported on 10/09/2021)  ? HYDROcodone-acetaminophen (NORCO/VICODIN) 5-325 MG tablet One tablet every six hours for pain.  Limit 7 days. (Patient not taking: Reported on 10/09/2021)  ? predniSONE (DELTASONE) 10 MG tablet Take 3 tablets (30 mg total) by mouth daily. (Patient not taking: Reported on 10/09/2021)  ? ?No facility-administered encounter medications on file as of 10/09/2021.  ? ?Past medical history ? ?Alcoholic cirrhosis.  Patient has not had  any alcohol since August 2021.  Her disease is complicated by thrombocytopenia and splenomegaly/portal hypertension.  She has never been screened for varices. ?HCV antibody noted in February 2015.  Patient declined follow-up. ?Depression ?Hypertension ?History of kidney stones.  Nephrolithotomy with ureteroscopy with holmium laser lithotripsy in July 2020. ? ?Abdominal hysterectomy 40 years ago. ?Left cataract extraction in March 2018 and right in  January 2019 ? ?Allergies ? ?Allergies  ?Allergen Reactions  ? Penicillins Hives  ? Tape   ?  Bruises her skin  ? ?Family history ? ?Father had lung cancer diagnosed in his 58s and he lived to be 23. ?Mother lived to be 21. ?Her Sister Mardene Celeste is 69 years old.  She has a brother 79 who is bedridden. ? ?Social history ? ?She is widowed.  She has 2 children living.  Both her sons are in good health 61 is 35 years old and once 37.  1 son died of drug overdose at age 16.  She has 2 stepchildren as well.  She has never smoked cigarettes..  History of alcohol intake as above. ? ?Physical examination ? ?Blood pressure 105/65, pulse 64, temperature (!) 97.4 ?F (36.3 ?C), temperature source Oral, height 5' 5"  (1.651 m), weight 173 lb 3.2 oz (78.6 kg). ?Patient is alert and does not have asterixis. ?Conjunctiva is pink. Sclera is nonicteric ?Oropharyngeal mucosa is normal. ?She has upper dentures and few teeth in lower jaw. ?No neck masses or thyromegaly noted. ?Cardiac exam with regular rhythm normal S1 and S2. No murmur or gallop noted. ?Lungs are clear to auscultation. ?Abdomen is symmetrical.  Bowel sounds are normal.  On palpation abdomen is soft.  She has mild tenderness midepigastrium and below the right costal margin.  No hepatosplenomegaly or masses. ?No LE edema or clubbing noted. ?She has bilateral palmar erythema. ? ?Labs/studies Results: ? ? ? ?  Latest Ref Rng & Units 05/26/2021  ?  1:39 PM 09/23/2019  ?  5:01 PM 01/01/2019  ?  1:56 PM  ?CBC  ?WBC 4.0 - 10.5 K/uL 4.9   4.4   7.4    ?Hemoglobin 12.0 - 15.0 g/dL 12.4   10.9   11.5    ?Hematocrit 36.0 - 46.0 % 36.4   31.8   34.6    ?Platelets 150 - 400 K/uL 105   76   137    ?  ? ?  Latest Ref Rng & Units 05/26/2021  ?  1:39 PM 09/23/2019  ?  5:01 PM 01/01/2019  ?  1:56 PM  ?CMP  ?Glucose 70 - 99 mg/dL 107   90   109    ?BUN 8 - 23 mg/dL 10   17   10     ?Creatinine 0.44 - 1.00 mg/dL 0.95   0.87   0.84    ?Sodium 135 - 145 mmol/L 140   142   143    ?Potassium 3.5 - 5.1  mmol/L 3.0   3.6   3.5    ?Chloride 98 - 111 mmol/L 109   113   107    ?CO2 22 - 32 mmol/L 25   26   28     ?Calcium 8.9 - 10.3 mg/dL 8.8   8.6   8.8    ?Total Protein 6.5 - 8.1 g/dL 7.0   6.5     ?Total Bilirubin 0.3 - 1.2 mg/dL 1.7   1.4     ?Alkaline Phos 38 - 126 U/L 64   78     ?  AST 15 - 41 U/L 33   37     ?ALT 0 - 44 U/L 19   24     ?  ? ?  Latest Ref Rng & Units 05/26/2021  ?  1:39 PM 09/23/2019  ?  5:01 PM 12/24/2018  ?  8:30 AM  ?Hepatic Function  ?Total Protein 6.5 - 8.1 g/dL 7.0   6.5   8.1    ?Albumin 3.5 - 5.0 g/dL 2.5   2.6   2.9    ?AST 15 - 41 U/L 33   37   27    ?ALT 0 - 44 U/L 19   24   17     ?Alk Phosphatase 38 - 126 U/L 64   78   65    ?Total Bilirubin 0.3 - 1.2 mg/dL 1.7   1.4   2.0    ? ?Lab data from 06/15/2021 ? ?Bilirubin 1.3, AP 85, AST 47, ALT 21, total protein 8.3 and albumin 2.7 ?Serum calcium 8.5 ?BUN 9 creatinine 0.98 ?Glucose 89. ?Serum sodium 143, potassium 3.8, chloride 109 and CO2 24. ? ?Assessment: ? ?#1.  Epigastric and right upper quadrant pain suspicious for biliary pain.  She has cholelithiasis and dilated bile duct but no evidence of choledocholithiasis on ultrasonography.  She will need further evaluation and with MRCP before ERCP considered. ? ?#2.  Alcoholic cirrhosis.  She has thrombocytopenia and splenomegaly on ultrasound.  She also has portal vein thrombosis which appears to be chronic.  Therefore there is no indication for anticoagulation at this time.  Will review studies from [facility] with help of radiologist.  She needs to be screened for varices with banding if indicated. ? ?#3.  History of positive hepatitis C virus antibody.  If she has viremia or active disease she needs to be treated as it may result in recovery of hepatic function.  Chronic HCV may also be contributing to liver disease. ? ?#4.  Diarrhea and incontinence.  Suspect she has IBS.  She needs to go back on dicyclomine at a low dose.  We will also arrange for diagnostic and/or screening colonoscopy in near  future. ? ? ?Recommendations ? ?Patient will go to the lab for CBC with differential, INR, comprehensive chemistry panel, AFP, HCVRNA by PCR quantitative followed by genotype of she has viremia. ?MRCP to be

## 2021-10-11 ENCOUNTER — Other Ambulatory Visit: Payer: Self-pay

## 2021-10-11 ENCOUNTER — Encounter (HOSPITAL_COMMUNITY): Payer: Self-pay

## 2021-10-11 ENCOUNTER — Emergency Department (HOSPITAL_COMMUNITY): Payer: Medicare Other

## 2021-10-11 ENCOUNTER — Inpatient Hospital Stay (HOSPITAL_COMMUNITY)
Admission: EM | Admit: 2021-10-11 | Discharge: 2021-10-18 | DRG: 435 | Disposition: A | Discharge status: Hospice | Payer: Medicare Other | Attending: Internal Medicine | Admitting: Internal Medicine

## 2021-10-11 DIAGNOSIS — F1020 Alcohol dependence, uncomplicated: Secondary | ICD-10-CM | POA: Diagnosis present

## 2021-10-11 DIAGNOSIS — R197 Diarrhea, unspecified: Secondary | ICD-10-CM

## 2021-10-11 DIAGNOSIS — Z87891 Personal history of nicotine dependence: Secondary | ICD-10-CM

## 2021-10-11 DIAGNOSIS — Z79899 Other long term (current) drug therapy: Secondary | ICD-10-CM | POA: Diagnosis not present

## 2021-10-11 DIAGNOSIS — K8020 Calculus of gallbladder without cholecystitis without obstruction: Secondary | ICD-10-CM | POA: Diagnosis not present

## 2021-10-11 DIAGNOSIS — K8309 Other cholangitis: Secondary | ICD-10-CM | POA: Diagnosis not present

## 2021-10-11 DIAGNOSIS — K219 Gastro-esophageal reflux disease without esophagitis: Secondary | ICD-10-CM | POA: Diagnosis not present

## 2021-10-11 DIAGNOSIS — E440 Moderate protein-calorie malnutrition: Secondary | ICD-10-CM | POA: Diagnosis not present

## 2021-10-11 DIAGNOSIS — I10 Essential (primary) hypertension: Secondary | ICD-10-CM | POA: Diagnosis present

## 2021-10-11 DIAGNOSIS — N179 Acute kidney failure, unspecified: Secondary | ICD-10-CM | POA: Diagnosis not present

## 2021-10-11 DIAGNOSIS — I509 Heart failure, unspecified: Secondary | ICD-10-CM | POA: Diagnosis not present

## 2021-10-11 DIAGNOSIS — Z66 Do not resuscitate: Secondary | ICD-10-CM | POA: Diagnosis present

## 2021-10-11 DIAGNOSIS — K769 Liver disease, unspecified: Secondary | ICD-10-CM | POA: Diagnosis not present

## 2021-10-11 DIAGNOSIS — Z825 Family history of asthma and other chronic lower respiratory diseases: Secondary | ICD-10-CM

## 2021-10-11 DIAGNOSIS — B9620 Unspecified Escherichia coli [E. coli] as the cause of diseases classified elsewhere: Secondary | ICD-10-CM | POA: Diagnosis present

## 2021-10-11 DIAGNOSIS — E278 Other specified disorders of adrenal gland: Secondary | ICD-10-CM | POA: Diagnosis not present

## 2021-10-11 DIAGNOSIS — I81 Portal vein thrombosis: Secondary | ICD-10-CM | POA: Diagnosis not present

## 2021-10-11 DIAGNOSIS — Z90710 Acquired absence of both cervix and uterus: Secondary | ICD-10-CM

## 2021-10-11 DIAGNOSIS — Z801 Family history of malignant neoplasm of trachea, bronchus and lung: Secondary | ICD-10-CM | POA: Diagnosis not present

## 2021-10-11 DIAGNOSIS — I110 Hypertensive heart disease with heart failure: Secondary | ICD-10-CM | POA: Diagnosis not present

## 2021-10-11 DIAGNOSIS — E8809 Other disorders of plasma-protein metabolism, not elsewhere classified: Secondary | ICD-10-CM | POA: Diagnosis not present

## 2021-10-11 DIAGNOSIS — R54 Age-related physical debility: Secondary | ICD-10-CM | POA: Diagnosis present

## 2021-10-11 DIAGNOSIS — K766 Portal hypertension: Secondary | ICD-10-CM | POA: Diagnosis not present

## 2021-10-11 DIAGNOSIS — R7989 Other specified abnormal findings of blood chemistry: Secondary | ICD-10-CM | POA: Diagnosis not present

## 2021-10-11 DIAGNOSIS — K7030 Alcoholic cirrhosis of liver without ascites: Secondary | ICD-10-CM | POA: Diagnosis present

## 2021-10-11 DIAGNOSIS — R1011 Right upper quadrant pain: Secondary | ICD-10-CM | POA: Diagnosis present

## 2021-10-11 DIAGNOSIS — R17 Unspecified jaundice: Secondary | ICD-10-CM | POA: Diagnosis not present

## 2021-10-11 DIAGNOSIS — D6959 Other secondary thrombocytopenia: Secondary | ICD-10-CM | POA: Diagnosis not present

## 2021-10-11 DIAGNOSIS — K7460 Unspecified cirrhosis of liver: Secondary | ICD-10-CM | POA: Diagnosis not present

## 2021-10-11 DIAGNOSIS — R52 Pain, unspecified: Secondary | ICD-10-CM | POA: Diagnosis not present

## 2021-10-11 DIAGNOSIS — C220 Liver cell carcinoma: Secondary | ICD-10-CM | POA: Diagnosis not present

## 2021-10-11 DIAGNOSIS — I868 Varicose veins of other specified sites: Secondary | ICD-10-CM | POA: Diagnosis not present

## 2021-10-11 DIAGNOSIS — Z7189 Other specified counseling: Secondary | ICD-10-CM | POA: Diagnosis not present

## 2021-10-11 DIAGNOSIS — N390 Urinary tract infection, site not specified: Secondary | ICD-10-CM

## 2021-10-11 DIAGNOSIS — R69 Illness, unspecified: Secondary | ICD-10-CM | POA: Diagnosis not present

## 2021-10-11 DIAGNOSIS — Z515 Encounter for palliative care: Secondary | ICD-10-CM

## 2021-10-11 DIAGNOSIS — R109 Unspecified abdominal pain: Secondary | ICD-10-CM | POA: Diagnosis not present

## 2021-10-11 DIAGNOSIS — F32A Depression, unspecified: Secondary | ICD-10-CM | POA: Diagnosis not present

## 2021-10-11 DIAGNOSIS — R772 Abnormality of alphafetoprotein: Secondary | ICD-10-CM | POA: Diagnosis not present

## 2021-10-11 DIAGNOSIS — K7010 Alcoholic hepatitis without ascites: Secondary | ICD-10-CM | POA: Diagnosis present

## 2021-10-11 DIAGNOSIS — Z743 Need for continuous supervision: Secondary | ICD-10-CM | POA: Diagnosis not present

## 2021-10-11 LAB — CBC WITH DIFFERENTIAL/PLATELET
Abs Immature Granulocytes: 0.07 10*3/uL (ref 0.00–0.07)
Basophils Absolute: 0 10*3/uL (ref 0.0–0.1)
Basophils Relative: 0 %
Eosinophils Absolute: 0 10*3/uL (ref 0.0–0.5)
Eosinophils Relative: 0 %
HCT: 32.9 % — ABNORMAL LOW (ref 36.0–46.0)
Hemoglobin: 10.9 g/dL — ABNORMAL LOW (ref 12.0–15.0)
Immature Granulocytes: 1 %
Lymphocytes Relative: 4 %
Lymphs Abs: 0.4 10*3/uL — ABNORMAL LOW (ref 0.7–4.0)
MCH: 31.7 pg (ref 26.0–34.0)
MCHC: 33.1 g/dL (ref 30.0–36.0)
MCV: 95.6 fL (ref 80.0–100.0)
Monocytes Absolute: 0.7 10*3/uL (ref 0.1–1.0)
Monocytes Relative: 8 %
Neutro Abs: 7.2 10*3/uL (ref 1.7–7.7)
Neutrophils Relative %: 87 %
Platelets: 103 10*3/uL — ABNORMAL LOW (ref 150–400)
RBC: 3.44 MIL/uL — ABNORMAL LOW (ref 3.87–5.11)
RDW: 14.8 % (ref 11.5–15.5)
WBC: 8.3 10*3/uL (ref 4.0–10.5)
nRBC: 0 % (ref 0.0–0.2)

## 2021-10-11 LAB — COMPREHENSIVE METABOLIC PANEL
ALT: 51 U/L — ABNORMAL HIGH (ref 0–44)
AST: 109 U/L — ABNORMAL HIGH (ref 15–41)
Albumin: 1.7 g/dL — ABNORMAL LOW (ref 3.5–5.0)
Alkaline Phosphatase: 147 U/L — ABNORMAL HIGH (ref 38–126)
Anion gap: 8 (ref 5–15)
BUN: 30 mg/dL — ABNORMAL HIGH (ref 8–23)
CO2: 21 mmol/L — ABNORMAL LOW (ref 22–32)
Calcium: 8.2 mg/dL — ABNORMAL LOW (ref 8.9–10.3)
Chloride: 104 mmol/L (ref 98–111)
Creatinine, Ser: 2.55 mg/dL — ABNORMAL HIGH (ref 0.44–1.00)
GFR, Estimated: 20 mL/min — ABNORMAL LOW (ref >60)
Glucose, Bld: 135 mg/dL — ABNORMAL HIGH (ref 70–99)
Potassium: 3.8 mmol/L (ref 3.5–5.1)
Sodium: 133 mmol/L — ABNORMAL LOW (ref 135–145)
Total Bilirubin: 4.1 mg/dL — ABNORMAL HIGH (ref 0.3–1.2)
Total Protein: 7.2 g/dL (ref 6.5–8.1)

## 2021-10-11 LAB — AMMONIA: Ammonia: 30 umol/L (ref 9–35)

## 2021-10-11 LAB — PROTIME-INR
INR: 1.2 (ref 0.8–1.2)
Prothrombin Time: 15 seconds (ref 11.4–15.2)

## 2021-10-11 LAB — ETHANOL: Alcohol, Ethyl (B): <10 mg/dL (ref <10)

## 2021-10-11 IMAGING — CT CT ABD-PELV W/O CM
2 of 5 series · 15 of 46 positions shown, 17 images · non-contrast
Comparison: [DATE]

CLINICAL DATA: Abdominal pain, vomiting, fever.  Cirrhosis.



[Series 3: thins · axial · 0.76mm/px · z∈[+788,+1168]mm · 12 of 901 slices shown, 14 images]
[im 70/901  soft-tissue]
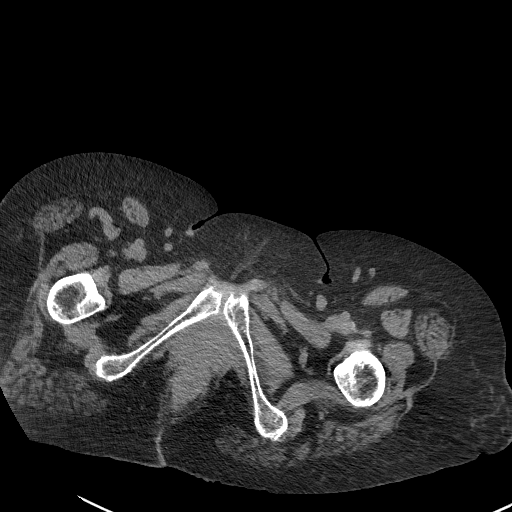
[im 70/901  bone]
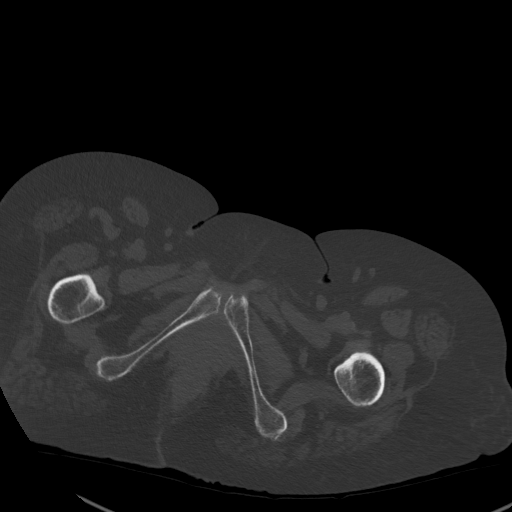
[im 139/901  soft-tissue]
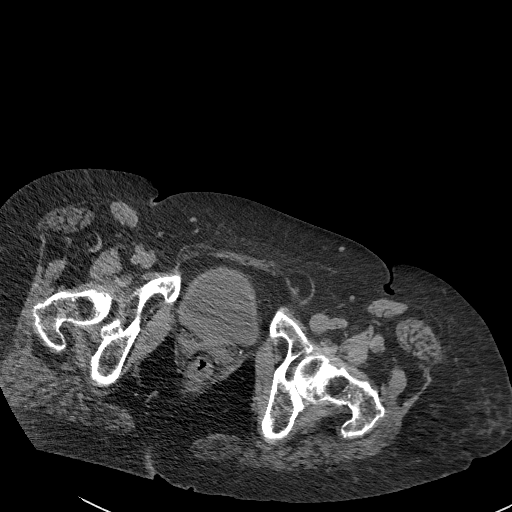
[im 208/901  soft-tissue]
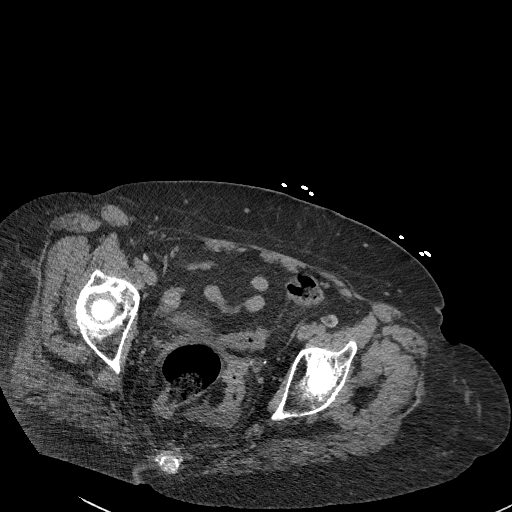
[im 277/901  soft-tissue]
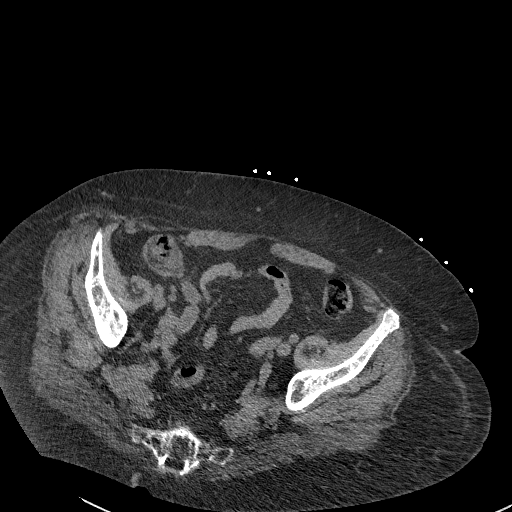
[im 347/901  soft-tissue]
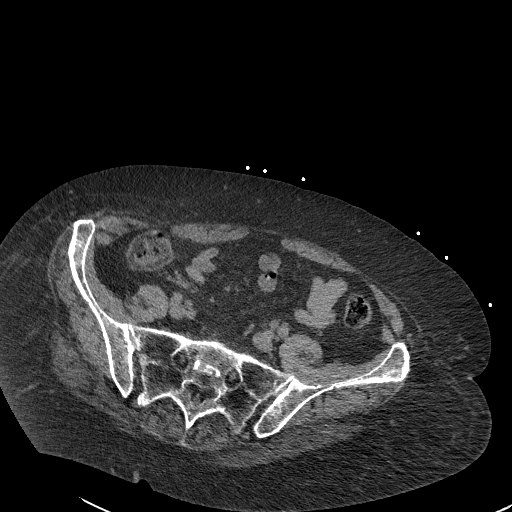
[im 416/901  soft-tissue]
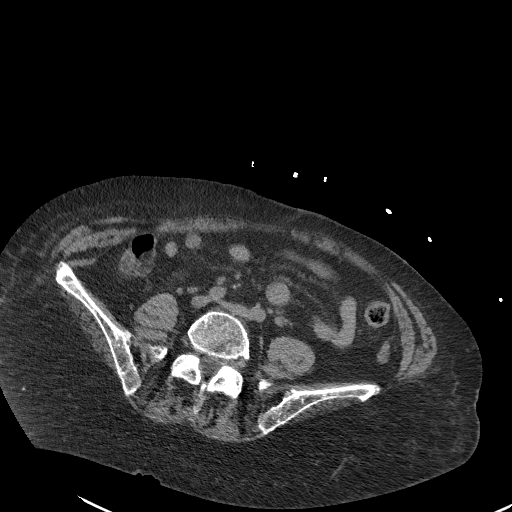
[im 485/901  soft-tissue]
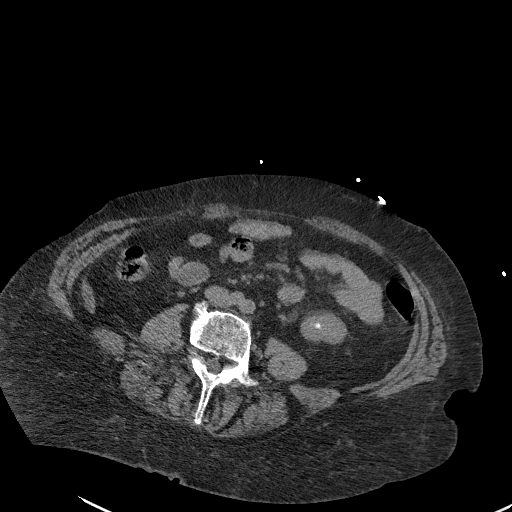
[im 554/901  soft-tissue]
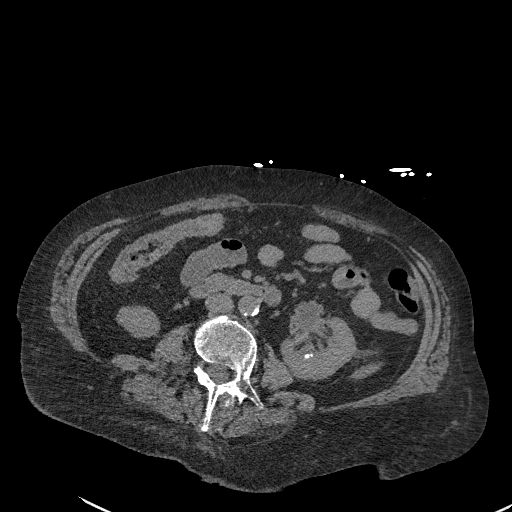
[im 624/901  soft-tissue]
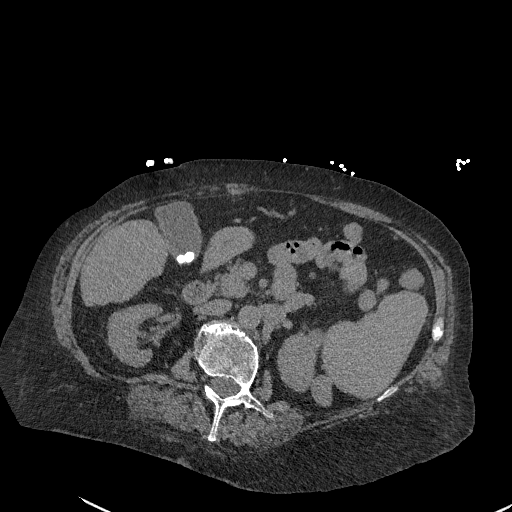
[im 624/901  bone]
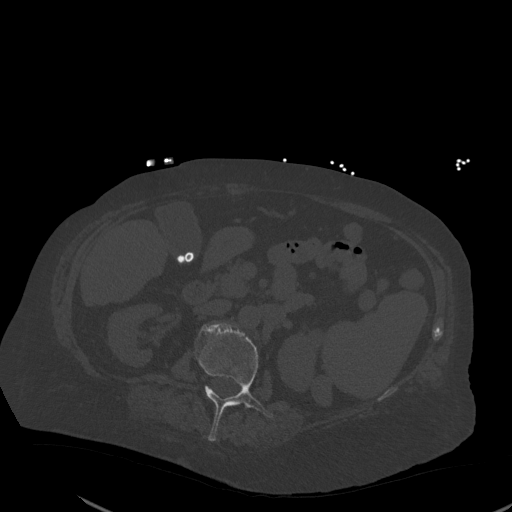
[im 693/901  soft-tissue]
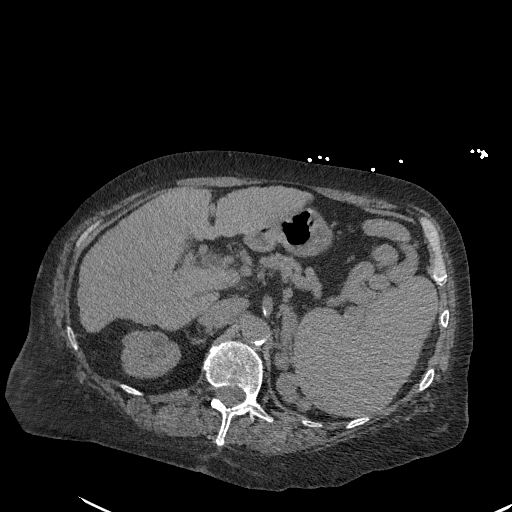
[im 762/901  soft-tissue]
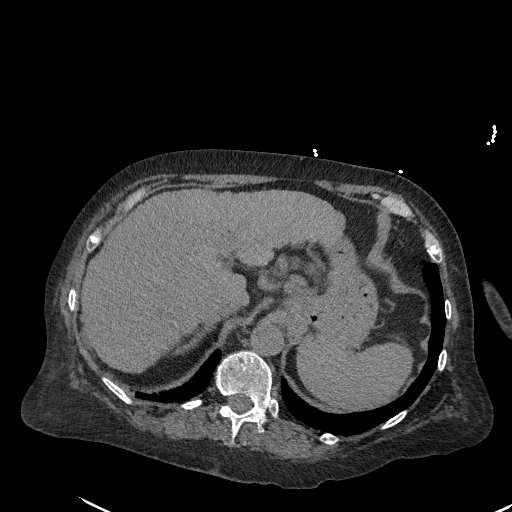
[im 831/901  soft-tissue]
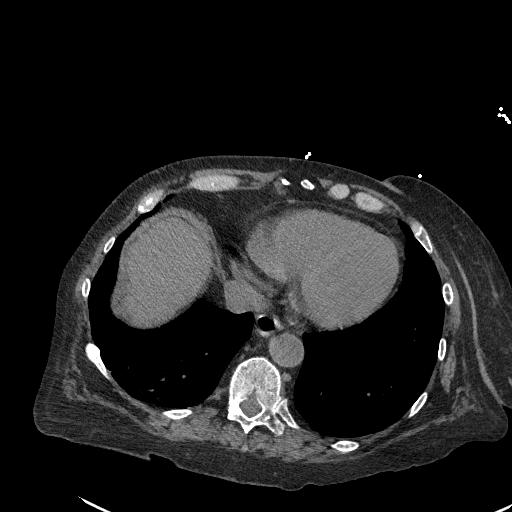

[Series 5: coronal st · coronal · 0.88mm/px · 3 of 78 slices shown]
[im 26/78  soft-tissue]
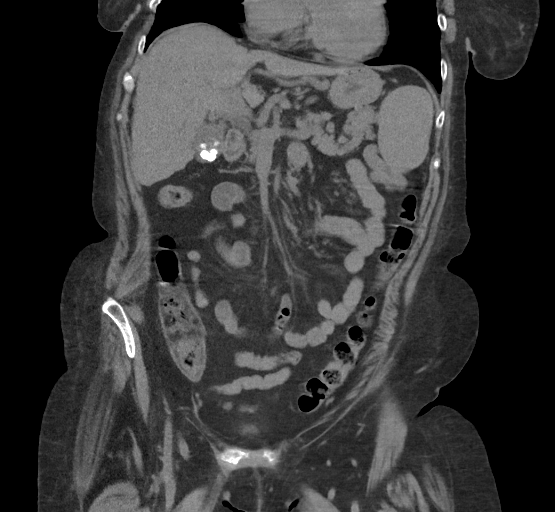
[im 35/78  soft-tissue]
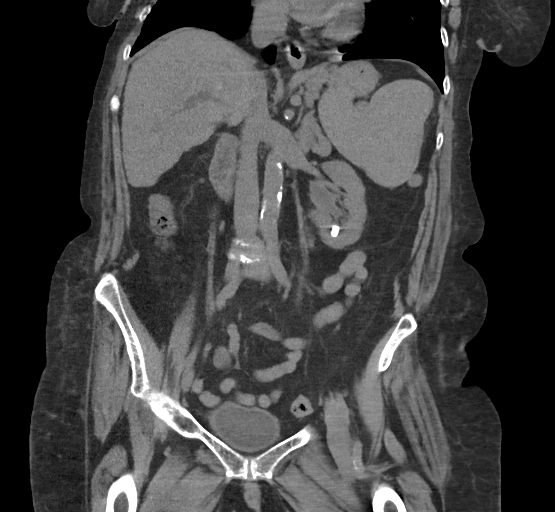
[im 43/78  soft-tissue]
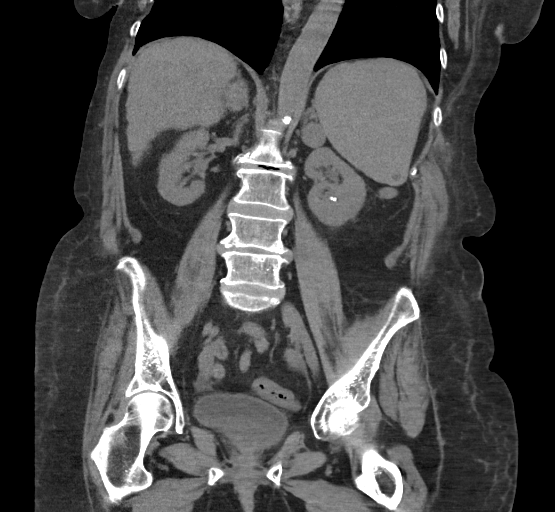

[15 of 46 positions shown; findings below may reference images not displayed]

FINDINGS: Lower chest: Subpleural scarring at the lung bases.

Hepatobiliary: Cirrhosis.  No focal hepatic lesion on unenhanced CT.

Layering gallstones (series 2/image 25), without associated
inflammatory changes. No intrahepatic or extrahepatic ductal
dilatation.

Pancreas: Within normal limits.

Spleen: At the upper limits of normal for size.

Adrenals/Urinary Tract: 2.3 x 2.1 x 3.0 cm right adrenal nodule
(series 2/image 18), new from [J4]. Left adrenal gland is not well
visualized but grossly unremarkable.

11 mm anterior right upper pole renal cyst (series 2/image 22).
Cortical scarring in the right kidney, likely related to prior
intervention. Bilateral nonobstructing renal calculi, measuring up
to 10 mm in the left lower pole (series 2/image 42). Urothelial
thickening along the left renal collecting system (series 2/image
36). No hydronephrosis.

Bladder is mildly thick-walled although underdistended.

Stomach/Bowel: Stomach is within normal limits.

No evidence of bowel obstruction.

Normal appendix (series 2/image 63).

No colonic wall thickening, inflammatory changes, or mass is evident
on CT.

Vascular/Lymphatic: No evidence of abdominal aortic aneurysm.
Atherosclerotic calcifications of the abdominal aorta and branch
vessels.

Cavernous transformation of the portal vein is suspected on
unenhanced CT (series 2/image 21). Perigastric varices. Splenorenal
shunt with extensive varices in the left mid abdomen (series 2/image
25).

No suspicious abdominopelvic lymphadenopathy.

Reproductive: Status post hysterectomy.

Bilateral ovaries are within normal limits.

Other: No abdominopelvic ascites.

Musculoskeletal: Healing right posterior 10th rib fracture. Mild
degenerative changes of the visualized thoracolumbar spine.
IMPRESSION: No evidence of bowel obstruction. Normal appendix. No colonic wall
thickening, inflammatory changes, or mass is evident on CT.

Cirrhosis. Suspected cavernous transformation of the portal vein
with abdominal varices, as above. Spleen is normal in size. No
abdominopelvic ascites.

3.0 right adrenal nodule, new from [J4]. If the patient has a known
malignancy, metastatic disease is certainly possible. Regardless,
consider follow-up adrenal protocol CT in 4-6 weeks for additional
characterization.

Cholelithiasis, without associated inflammatory changes.

Bilateral nonobstructing renal calculi, measuring up to 10 mm in the
left lower pole. No hydronephrosis.

## 2021-10-11 MED ORDER — SODIUM CHLORIDE 0.9 % IV SOLN
1.0000 g | Freq: Once | INTRAVENOUS | Status: AC
  Administered 2021-10-11: 1000 mg via INTRAVENOUS
  Filled 2021-10-11: qty 1

## 2021-10-11 MED ORDER — IOHEXOL 9 MG/ML PO SOLN
ORAL | Status: AC
  Filled 2021-10-11: qty 1000

## 2021-10-11 NOTE — ED Notes (Signed)
Pt unable to urinate at this time.  

## 2021-10-11 NOTE — ED Triage Notes (Signed)
Reports fever at home of 101.6.  afebrile at present.  Reports vomiting x 2 weeks.  Hx of  Cirrhosis.  Pt has yellow skin.  Resp even and unlabored.  Skin warm and dry.  Vomiting in triage ?

## 2021-10-11 NOTE — ED Provider Notes (Signed)
?[facility] ?Provider Note ? ? ?CSN: [id number] ?Arrival date & time: 10/11/21  1826 ? ?  ? ?History ? ?Chief Complaint  ?Patient presents with  ? Fever  ? ? ?Alyssa Pena is a 71 y.o. female. ? ? ?Fever ?Associated symptoms: chills, diarrhea, dysuria and nausea   ?Associated symptoms: no chest pain, no congestion, no cough, no headaches, no myalgias, no rhinorrhea and no vomiting   ? ?71 year old female presents emergency department with 1 month history of diarrhea, abdominal pain.  She is coming by her sister who is at the bedside and is primary historian secondary to patient's confusion and lack of knowledge of timeline.  Sister states that patient has been progressively more confused over the past week.  She had an office visit 2 days ago with Dr. Laural Golden who stated that she must come into the emergency department if she begins to have a fever due to her extensive history as well as known common bile duct dilation.  She had a documented at home fever of above 101 which prompted her current visit.  Patient denies chest pain, shortness of breath, vaginal symptoms, recent medicine changes. ? ?She has a past medical history significant for alcoholic hepatitis, cirrhosis, CHF, hypertension, renal disorder, renal calculi, alcohol dependence (she has not had an alcoholic drink since 0630). ? ?Past surgical history significant for urethral stent, nephrolithotomy, ureteroscopy with lithotripsy, abdominal hysterectomy.  ? ?Home Medications ?Prior to Admission medications   ?Medication Sig Start Date End Date Taking? Authorizing Provider  ?albuterol (VENTOLIN HFA) 108 (90 Base) MCG/ACT inhaler Inhale 1-2 puffs into the lungs every 6 (six) hours as needed for wheezing or shortness of breath. 06/23/20  Yes Marney Setting, NP  ?benazepril (LOTENSIN) 5 MG tablet Take 5 mg by mouth daily. 09/04/21  Yes [provider]  ?carvedilol (COREG) 12.5 MG tablet Take 12.5 mg by mouth 2 (two) times daily.  04/21/18  Yes [provider]  ?dicyclomine (BENTYL) 10 MG capsule Take 10 mg by mouth daily. 09/04/21  Yes [provider]  ?FLUoxetine (PROZAC) 40 MG capsule Take 40 mg by mouth daily. 03/08/21  Yes [provider]  ?furosemide (LASIX) 20 MG tablet Take 20 mg by mouth daily. 05/06/19  Yes [provider]  ?omeprazole (PRILOSEC) 20 MG capsule Take 20 mg by mouth daily.   Yes [provider]  ?ondansetron (ZOFRAN) 4 MG tablet Take 4 mg by mouth every 4 (four) hours as needed. 09/16/21  Yes [provider]  ?ondansetron (ZOFRAN-ODT) 4 MG disintegrating tablet Take 4 mg by mouth every 4 (four) hours as needed for nausea or vomiting.   Yes [provider]  ?psyllium (METAMUCIL SMOOTH TEXTURE) 58.6 % powder Take 1 packet by mouth at bedtime. 10/09/21  Yes Rehman, Mechele Dawley, MD  ?   ? ?Allergies    ?Penicillins and Tape   ? ?Review of Systems   ?Review of Systems  ?Constitutional:  Positive for activity change, appetite change, chills, fatigue and fever.  ?HENT:  Negative for congestion, postnasal drip and rhinorrhea.   ?Eyes:  Negative for pain, discharge and visual disturbance.  ?Respiratory:  Negative for cough, chest tightness, shortness of breath, wheezing and stridor.   ?Cardiovascular:  Negative for chest pain, palpitations and leg swelling.  ?Gastrointestinal:  Positive for abdominal pain, diarrhea and nausea. Negative for anal bleeding, blood in stool, constipation and vomiting.  ?Genitourinary:  Positive for dysuria. Negative for frequency, hematuria, urgency, vaginal bleeding and vaginal discharge.  ?  Musculoskeletal:  Negative for arthralgias and myalgias.  ?Skin:   ?     Yellow tint to skin  ?Neurological:  Negative for dizziness, seizures, syncope, weakness, numbness and headaches.  ? ?Physical Exam ?Updated Vital Signs ?BP (!) 101/49   Pulse 68   Temp 98.3 ?F (36.8 ?C) (Oral)   Resp 18   Ht 5' 5"  (1.651 m)   Wt 78 kg   SpO2 96%   BMI 28.62 kg/m?   ?Physical Exam ?Vitals and nursing note reviewed.  ?Constitutional:   ?   General: She is not in acute distress. ?   Appearance: She is ill-appearing. She is not diaphoretic.  ?HENT:  ?   Head: Normocephalic.  ?   Right Ear: Tympanic membrane normal.  ?   Left Ear: Tympanic membrane normal.  ?   Nose: Nose normal. No congestion.  ?   Mouth/Throat:  ?   Mouth: Mucous membranes are moist.  ?   Pharynx: Oropharynx is clear.  ?Eyes:  ?   Extraocular Movements: Extraocular movements intact.  ?   Pupils: Pupils are equal, round, and reactive to light.  ?Cardiovascular:  ?   Rate and Rhythm: Normal rate and regular rhythm.  ?   Pulses: Normal pulses.  ?   Heart sounds: Normal heart sounds.  ?Pulmonary:  ?   Effort: Pulmonary effort is normal.  ?   Breath sounds: Normal breath sounds. No wheezing or rales.  ?Abdominal:  ?   General: Bowel sounds are normal.  ?   Palpations: Abdomen is soft. There is no mass.  ?   Tenderness: There is abdominal tenderness in the right upper quadrant. There is no right CVA tenderness or left CVA tenderness.  ?   Hernia: No hernia is present.  ?   Comments: Generalized abdominal tenderness.  No guarding noted.  ?Musculoskeletal:  ?   Cervical back: Normal range of motion and neck supple.  ?Skin: ?   General: Skin is warm and dry.  ?   Capillary Refill: Capillary refill takes less than 2 seconds.  ?   Coloration: Skin is jaundiced.  ?Neurological:  ?   General: No focal deficit present.  ?   Mental Status: She is oriented to person, place, and time. She is lethargic.  ?   Comments: Patient oriented x3 but confused about specific details about her medical timeline.  ?Psychiatric:     ?   Mood and Affect: Mood normal.     ?   Behavior: Behavior normal. Behavior is cooperative.  ? ? ?ED Results / Procedures / Treatments   ?Labs ?(all labs ordered are listed, but only abnormal results are displayed) ?Labs Reviewed  ?COMPREHENSIVE METABOLIC PANEL - Abnormal; Notable for the following components:  ?     Result Value  ? Sodium 133 (*)   ? CO2 21 (*)   ? Glucose, Bld 135 (*)   ? BUN 30 (*)   ? Creatinine, Ser 2.55 (*)   ? Calcium 8.2 (*)   ? Albumin 1.7 (*)   ? AST 109 (*)   ? ALT 51 (*)   ? Alkaline Phosphatase 147 (*)   ? Total Bilirubin 4.1 (*)   ? GFR, Estimated 20 (*)   ? All other components within normal limits  ?URINE CULTURE  ?PROTIME-INR  ?ETHANOL  ?CBC WITH DIFFERENTIAL/PLATELET  ?AMMONIA  ?URINALYSIS, ROUTINE W REFLEX MICROSCOPIC  ? ? ?EKG ?None ? ?Radiology ?No results found. ? ?Procedures ?Procedures  ? ? ?Medications  Ordered in ED ?Medications  ?iohexol (OMNIPAQUE) 9 MG/ML oral solution (0 mLs  Hold 10/11/21 2058)  ? ? ?ED Course/ Medical Decision Making/ A&P ?  ?                        ?Medical Decision Making ?Amount and/or Complexity of Data Reviewed ?Labs: ordered. ?Radiology: ordered. ? ? ?This patient presents to the ED for concern of arm pain and fever, this involves an extensive number of treatment options, and is a complaint that carries with it a high risk of complications and morbidity.  The differential diagnosis includes ascending cholangitis, appendicitis, small bowel obstruction, pancreatitis, hepatitis ? ? ?Co morbidities that complicate the patient evaluation ? ? alcoholic hepatitis, cirrhosis, CHF, hypertension, renal disorder, renal calculi, alcohol dependence ? ?Additional history obtained: ? ?Additional history obtained from office note from 10/09/2021 ?External records from outside source obtained and reviewed including right upper quadrant ultrasound: Cholelithiasis and dilated bile duct but no evidence of choledocholithiasis on ultrasound.  She needs further evaluation with MRCP before ERCP considered."  ? ? ?Lab Tests: ? ?I Ordered, and personally interpreted labs.  The pertinent results include: GFR of 20 with creatinine of 2.55 and BUN of 30, albumin of 1.7, AST 109, ALT 51, alk phos 147, total bilirubin 4.1, platelets 103.  UA and urine culture pending upon shift  change ? ? ?Imaging Studies ordered: ? ?I ordered imaging studies including CT abdomen pelvis without contrast ?I independently visualized and interpreted imaging which showed cirrhosis with suspected cavernous trans

## 2021-10-11 NOTE — ED Notes (Addendum)
Spoke to EDP regarding PO contrast for CT study. EDP prefers "non con study please" ?This RN spoke to CT regarding contrast. CT will get pt soon ?

## 2021-10-12 ENCOUNTER — Inpatient Hospital Stay (HOSPITAL_COMMUNITY): Payer: Medicare Other

## 2021-10-12 ENCOUNTER — Telehealth (INDEPENDENT_AMBULATORY_CARE_PROVIDER_SITE_OTHER): Payer: Self-pay

## 2021-10-12 DIAGNOSIS — K7010 Alcoholic hepatitis without ascites: Secondary | ICD-10-CM | POA: Diagnosis present

## 2021-10-12 DIAGNOSIS — K7030 Alcoholic cirrhosis of liver without ascites: Secondary | ICD-10-CM | POA: Diagnosis not present

## 2021-10-12 DIAGNOSIS — Z90710 Acquired absence of both cervix and uterus: Secondary | ICD-10-CM | POA: Diagnosis not present

## 2021-10-12 DIAGNOSIS — N390 Urinary tract infection, site not specified: Secondary | ICD-10-CM | POA: Diagnosis present

## 2021-10-12 DIAGNOSIS — F1020 Alcohol dependence, uncomplicated: Secondary | ICD-10-CM | POA: Diagnosis present

## 2021-10-12 DIAGNOSIS — I509 Heart failure, unspecified: Secondary | ICD-10-CM | POA: Diagnosis present

## 2021-10-12 DIAGNOSIS — R772 Abnormality of alphafetoprotein: Secondary | ICD-10-CM | POA: Diagnosis not present

## 2021-10-12 DIAGNOSIS — I81 Portal vein thrombosis: Secondary | ICD-10-CM | POA: Diagnosis present

## 2021-10-12 DIAGNOSIS — N179 Acute kidney failure, unspecified: Secondary | ICD-10-CM

## 2021-10-12 DIAGNOSIS — F32A Depression, unspecified: Secondary | ICD-10-CM | POA: Diagnosis present

## 2021-10-12 DIAGNOSIS — K219 Gastro-esophageal reflux disease without esophagitis: Secondary | ICD-10-CM | POA: Diagnosis present

## 2021-10-12 DIAGNOSIS — Z87891 Personal history of nicotine dependence: Secondary | ICD-10-CM | POA: Diagnosis not present

## 2021-10-12 DIAGNOSIS — K769 Liver disease, unspecified: Secondary | ICD-10-CM | POA: Diagnosis not present

## 2021-10-12 DIAGNOSIS — E440 Moderate protein-calorie malnutrition: Secondary | ICD-10-CM | POA: Diagnosis present

## 2021-10-12 DIAGNOSIS — R7989 Other specified abnormal findings of blood chemistry: Secondary | ICD-10-CM

## 2021-10-12 DIAGNOSIS — K7460 Unspecified cirrhosis of liver: Secondary | ICD-10-CM | POA: Diagnosis not present

## 2021-10-12 DIAGNOSIS — Z515 Encounter for palliative care: Secondary | ICD-10-CM | POA: Diagnosis not present

## 2021-10-12 DIAGNOSIS — R197 Diarrhea, unspecified: Secondary | ICD-10-CM | POA: Diagnosis not present

## 2021-10-12 DIAGNOSIS — D6959 Other secondary thrombocytopenia: Secondary | ICD-10-CM | POA: Diagnosis present

## 2021-10-12 DIAGNOSIS — E8809 Other disorders of plasma-protein metabolism, not elsewhere classified: Secondary | ICD-10-CM

## 2021-10-12 DIAGNOSIS — Z66 Do not resuscitate: Secondary | ICD-10-CM | POA: Diagnosis present

## 2021-10-12 DIAGNOSIS — K8309 Other cholangitis: Secondary | ICD-10-CM

## 2021-10-12 DIAGNOSIS — R17 Unspecified jaundice: Secondary | ICD-10-CM | POA: Diagnosis present

## 2021-10-12 DIAGNOSIS — Z825 Family history of asthma and other chronic lower respiratory diseases: Secondary | ICD-10-CM | POA: Diagnosis not present

## 2021-10-12 DIAGNOSIS — K766 Portal hypertension: Secondary | ICD-10-CM | POA: Diagnosis present

## 2021-10-12 DIAGNOSIS — E278 Other specified disorders of adrenal gland: Secondary | ICD-10-CM | POA: Diagnosis present

## 2021-10-12 DIAGNOSIS — C220 Liver cell carcinoma: Secondary | ICD-10-CM | POA: Diagnosis present

## 2021-10-12 DIAGNOSIS — Z801 Family history of malignant neoplasm of trachea, bronchus and lung: Secondary | ICD-10-CM | POA: Diagnosis not present

## 2021-10-12 DIAGNOSIS — I110 Hypertensive heart disease with heart failure: Secondary | ICD-10-CM | POA: Diagnosis present

## 2021-10-12 DIAGNOSIS — Z7189 Other specified counseling: Secondary | ICD-10-CM | POA: Diagnosis not present

## 2021-10-12 DIAGNOSIS — B9620 Unspecified Escherichia coli [E. coli] as the cause of diseases classified elsewhere: Secondary | ICD-10-CM | POA: Diagnosis present

## 2021-10-12 DIAGNOSIS — Z79899 Other long term (current) drug therapy: Secondary | ICD-10-CM | POA: Diagnosis not present

## 2021-10-12 LAB — URINALYSIS, ROUTINE W REFLEX MICROSCOPIC
Bacteria, UA: NONE SEEN
Bilirubin Urine: NEGATIVE
Glucose, UA: NEGATIVE mg/dL
Ketones, ur: NEGATIVE mg/dL
Nitrite: NEGATIVE
Protein, ur: 30 mg/dL — AB
RBC / HPF: >50 RBC/hpf — ABNORMAL HIGH (ref 0–5)
Specific Gravity, Urine: 1.011 (ref 1.005–1.030)
WBC, UA: >50 WBC/hpf — ABNORMAL HIGH (ref 0–5)
pH: 5 (ref 5.0–8.0)

## 2021-10-12 LAB — CBC WITH DIFFERENTIAL/PLATELET
Abs Immature Granulocytes: 0.08 10*3/uL — ABNORMAL HIGH (ref 0.00–0.07)
Basophils Absolute: 0 10*3/uL (ref 0.0–0.1)
Basophils Relative: 0 %
Eosinophils Absolute: 0 10*3/uL (ref 0.0–0.5)
Eosinophils Relative: 0 %
HCT: 26.2 % — ABNORMAL LOW (ref 36.0–46.0)
Hemoglobin: 8.8 g/dL — ABNORMAL LOW (ref 12.0–15.0)
Immature Granulocytes: 1 %
Lymphocytes Relative: 11 %
Lymphs Abs: 1.3 10*3/uL (ref 0.7–4.0)
MCH: 31.9 pg (ref 26.0–34.0)
MCHC: 33.6 g/dL (ref 30.0–36.0)
MCV: 94.9 fL (ref 80.0–100.0)
Monocytes Absolute: 1.4 10*3/uL — ABNORMAL HIGH (ref 0.1–1.0)
Monocytes Relative: 13 %
Neutro Abs: 8.7 10*3/uL — ABNORMAL HIGH (ref 1.7–7.7)
Neutrophils Relative %: 75 %
Platelets: 86 10*3/uL — ABNORMAL LOW (ref 150–400)
RBC: 2.76 MIL/uL — ABNORMAL LOW (ref 3.87–5.11)
RDW: 14.8 % (ref 11.5–15.5)
WBC: 11.5 10*3/uL — ABNORMAL HIGH (ref 4.0–10.5)
nRBC: 0 % (ref 0.0–0.2)

## 2021-10-12 LAB — COMPREHENSIVE METABOLIC PANEL
ALT: 42 U/L (ref 0–44)
AST: 89 U/L — ABNORMAL HIGH (ref 15–41)
Albumin: <1.5 g/dL — ABNORMAL LOW (ref 3.5–5.0)
Alkaline Phosphatase: 122 U/L (ref 38–126)
Anion gap: 6 (ref 5–15)
BUN: 33 mg/dL — ABNORMAL HIGH (ref 8–23)
CO2: 23 mmol/L (ref 22–32)
Calcium: 8 mg/dL — ABNORMAL LOW (ref 8.9–10.3)
Chloride: 105 mmol/L (ref 98–111)
Creatinine, Ser: 2.53 mg/dL — ABNORMAL HIGH (ref 0.44–1.00)
GFR, Estimated: 20 mL/min — ABNORMAL LOW (ref >60)
Glucose, Bld: 128 mg/dL — ABNORMAL HIGH (ref 70–99)
Potassium: 3.9 mmol/L (ref 3.5–5.1)
Sodium: 134 mmol/L — ABNORMAL LOW (ref 135–145)
Total Bilirubin: 3.5 mg/dL — ABNORMAL HIGH (ref 0.3–1.2)
Total Protein: 5.9 g/dL — ABNORMAL LOW (ref 6.5–8.1)

## 2021-10-12 LAB — MAGNESIUM: Magnesium: 1.5 mg/dL — ABNORMAL LOW (ref 1.7–2.4)

## 2021-10-12 LAB — AMMONIA: Ammonia: 34 umol/L (ref 9–35)

## 2021-10-12 LAB — HIV ANTIBODY (ROUTINE TESTING W REFLEX): HIV Screen 4th Generation wRfx: NONREACTIVE

## 2021-10-12 IMAGING — MR MR ABDOMEN WO/W CM MRCP
23 of 25 series · 46 of 48 positions shown · IV contrast (gadavist)
Comparison: CT [DATE]

CLINICAL DATA: Jaundice

EXAM:
MRI ABDOMEN WITHOUT AND WITH CONTRAST (INCLUDING MRCP)
TECHNIQUE: Multiplanar multisequence MR imaging of the abdomen was performed
both before and after the administration of intravenous contrast.
Heavily T2-weighted images of the biliary and pancreatic ducts were
obtained, and three-dimensional MRCP images were rendered by post
processing.
CONTRAST:  7.5mL GADAVIST GADOBUTROL 1 MMOL/ML IV SOLN

[Series 4: cor haste · coronal · 6.0mm · 1.25mm/px · 1 of 26 slices shown]
[im 1/26]
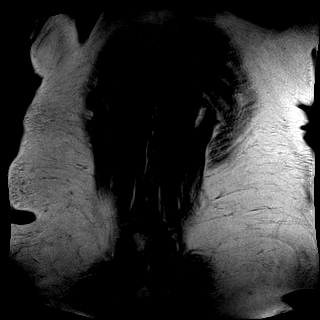

[Series 5: T2 fat-sat · axial · 6.0mm · 1.19mm/px · 1 of 32 slices shown]
[im 1/32]
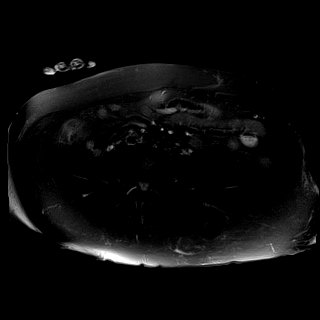

[Series 6: DWI · axial · 6.0mm · 1.42mm/px · 1 of 34 slices shown (1 of 4)]
[im 1/34]
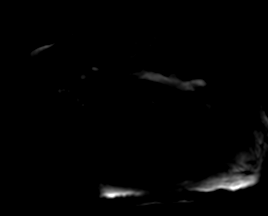

[Series 6: DWI · axial · 6.0mm · 1.42mm/px · 1 of 34 slices shown (2 of 4)]
[im 1/34]
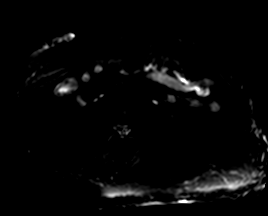

[Series 6: DWI · axial · 6.0mm · 1.42mm/px · 1 of 34 slices shown (3 of 4)]
[im 1/34]
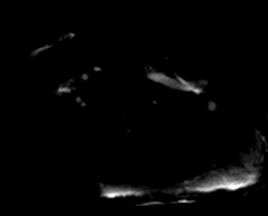

[Series 7: DWI · axial · 6.0mm · 1.42mm/px · 1 of 34 slices shown (4 of 4)]
[im 1/34]
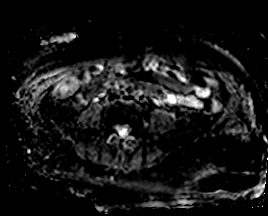

[Series 8: ax in and · axial · 3.0mm · 1.25mm/px · z∈[-137,+100]mm · 2 of 80 slices shown (1 of 2)]
[im 1/80]
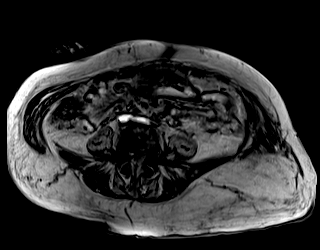
[im 80/80]
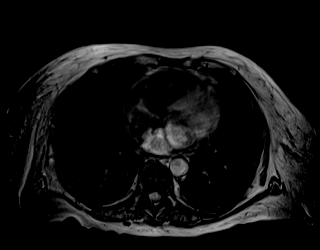

[Series 9: ax in and · axial · 3.0mm · 1.25mm/px · z∈[-137,+100]mm · 2 of 80 slices shown (2 of 2)]
[im 1/80]
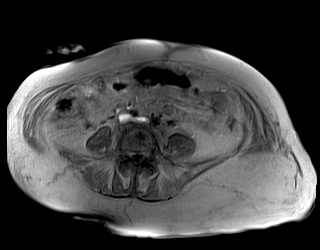
[im 80/80]
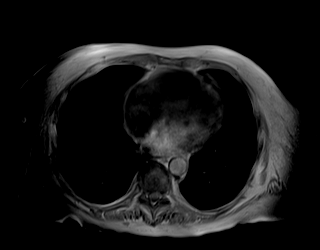

[Series 10: ax haste bh · axial · 6.0mm · 1.19mm/px · 1 of 36 slices shown]
[im 1/36]
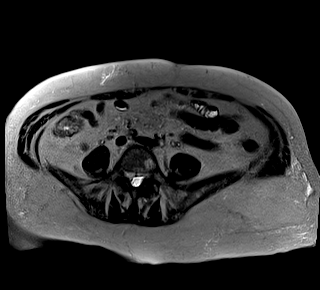

[Series 15: MRCP · coronal · 4.0mm · 1.12mm/px · 1 of 15 slices shown (1 of 2)]
[im 1/15]
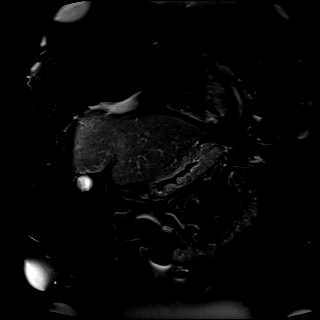

[Series 16: MRCP · coronal · 50.0mm · 0.78mm/px · 1 of 5 slices shown (2 of 2)]
[im 1/5]
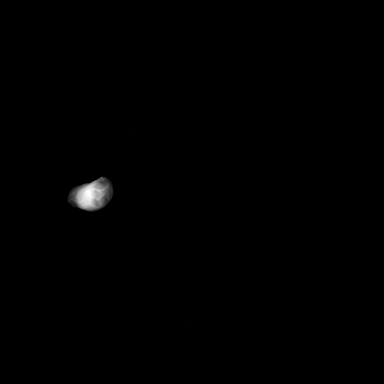

[Series 17: cor in and · coronal · 3.0mm · 1.33mm/px · 2 of 56 slices shown (1 of 2)]
[im 1/56]
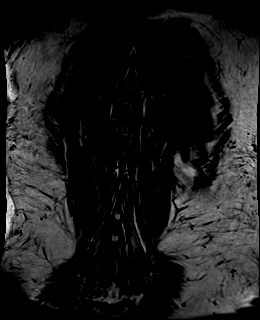
[im 56/56]
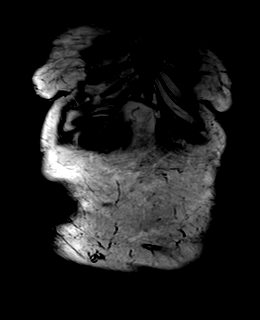

[Series 18: cor in and · coronal · 3.0mm · 1.33mm/px · 2 of 56 slices shown (2 of 2)]
[im 1/56]
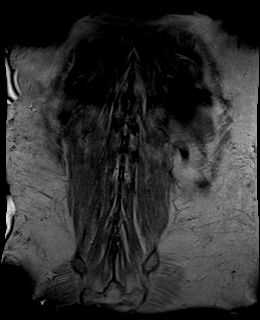
[im 56/56]
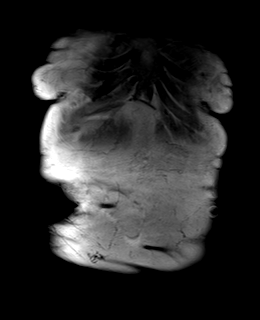

[Series 19: T1 dynamic · axial · 3.0mm · 1.19mm/px · z∈[-137,+100]mm · 3 of 80 slices shown]
[im 1/80]
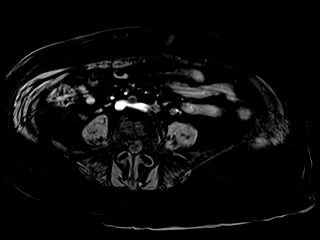
[im 40/80]
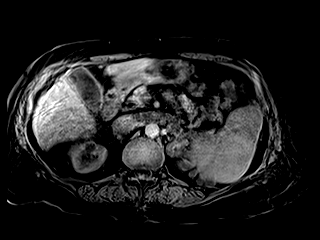
[im 80/80]
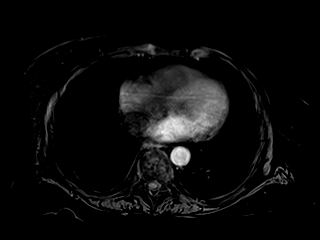

[Series 20: T1 dynamic post-contrast · axial · 3.0mm · 1.19mm/px · z∈[-137,+100]mm · 3 of 80 slices shown (1 of 9)]
[im 1/80]
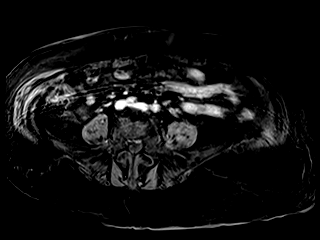
[im 40/80]
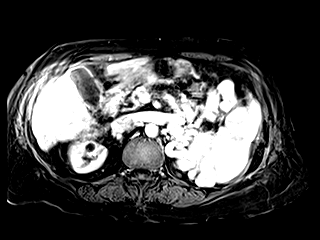
[im 80/80]
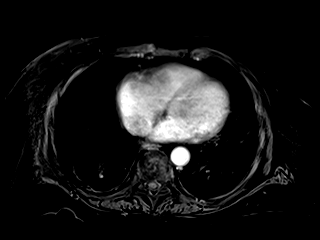

[Series 21: T1 dynamic post-contrast · axial · 3.0mm · 1.19mm/px · z∈[-137,+100]mm · 3 of 80 slices shown (2 of 9)]
[im 1/80]
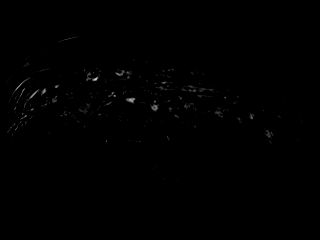
[im 40/80]
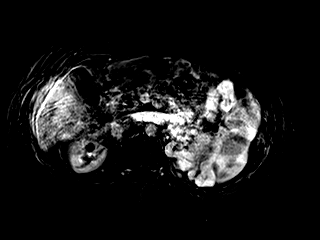
[im 80/80]
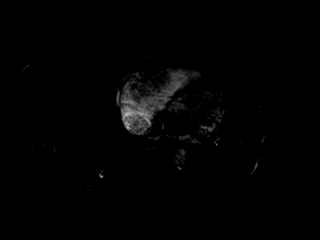

[Series 22: T1 dynamic post-contrast · axial · 3.0mm · 1.19mm/px · z∈[-137,+100]mm · 3 of 80 slices shown (3 of 9)]
[im 1/80]
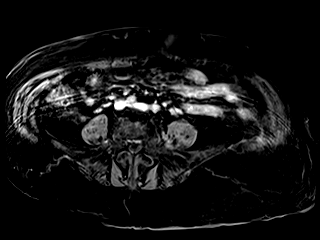
[im 40/80]
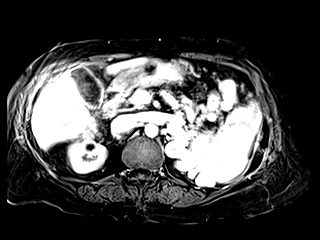
[im 80/80]
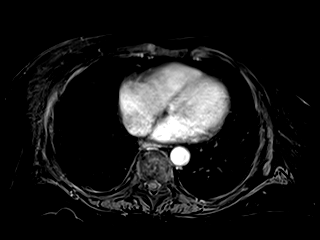

[Series 23: T1 dynamic post-contrast · axial · 3.0mm · 1.19mm/px · z∈[-137,+100]mm · 3 of 80 slices shown (4 of 9)]
[im 1/80]
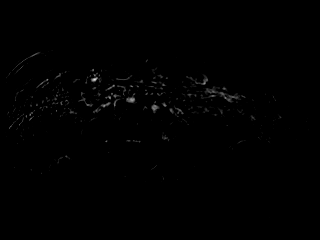
[im 40/80]
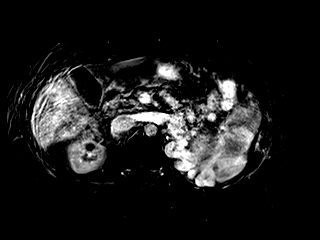
[im 80/80]
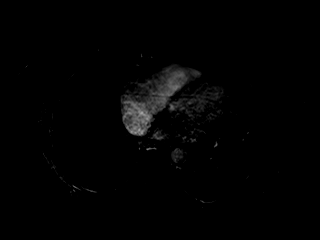

[Series 24: T1 dynamic post-contrast · axial · 3.0mm · 1.19mm/px · z∈[-137,+100]mm · 3 of 80 slices shown (5 of 9)]
[im 1/80]
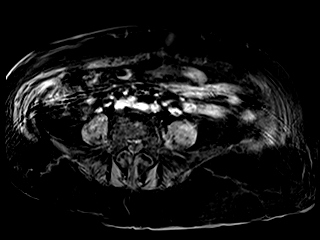
[im 40/80]
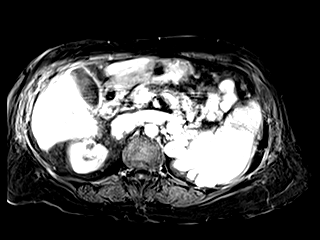
[im 80/80]
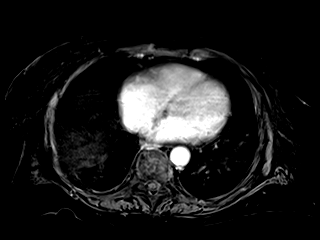

[Series 25: T1 dynamic post-contrast · axial · 3.0mm · 1.19mm/px · z∈[-137,+100]mm · 3 of 80 slices shown (6 of 9)]
[im 1/80]
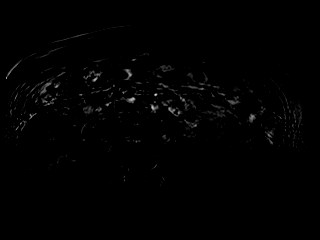
[im 40/80]
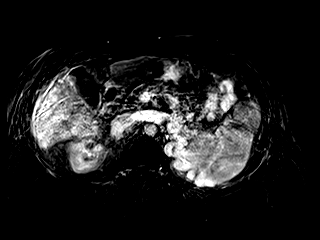
[im 80/80]
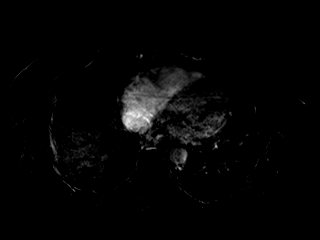

[Series 26: T1 dynamic post-contrast · coronal · 3.0mm · 1.31mm/px · 2 of 72 slices shown (7 of 9)]
[im 1/72]
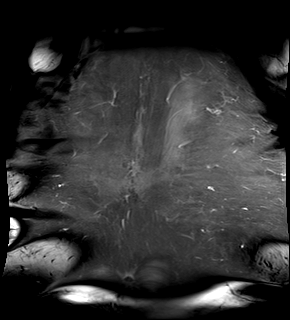
[im 72/72]
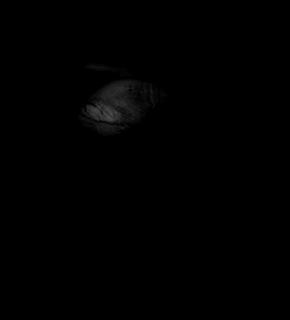

[Series 27: T1 dynamic post-contrast · axial · 3.0mm · 1.19mm/px · z∈[-137,+100]mm · 3 of 80 slices shown (8 of 9)]
[im 1/80]
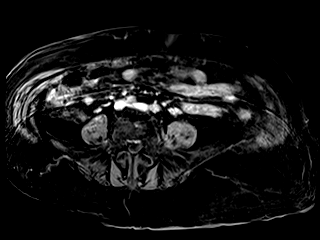
[im 40/80]
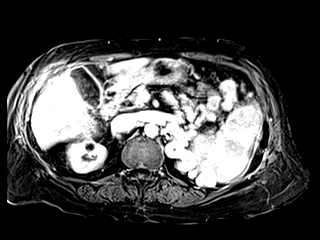
[im 80/80]
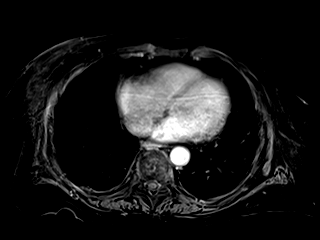

[Series 28: T1 dynamic post-contrast · axial · 3.0mm · 1.19mm/px · z∈[-137,+100]mm · 3 of 80 slices shown (9 of 9)]
[im 1/80]
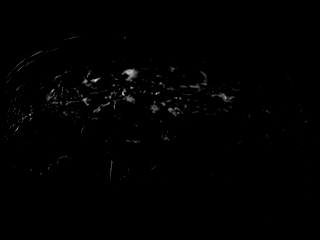
[im 40/80]
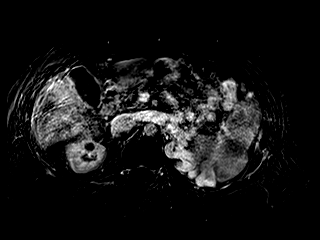
[im 80/80]
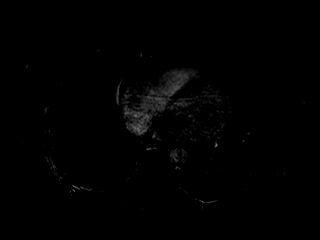

[46 of 48 positions shown; findings below may reference images not displayed]

FINDINGS: Lower chest: No acute findings.

Hepatobiliary: Morphologic features of the liver compatible with
cirrhosis including caudate lobe hypertrophy and nodular contour.
The postoperative images are suboptimal due to motion artifact and
delayed timing of the contrast bolus. Within this limitation no
obvious focal enhancing liver lesions are identified. However, there
is complete, expansile thrombus involving the middle and right
intrahepatic branches of the portal vein. Due to the limitations
described above it is difficult to visually determine whether this
is enhancing or bland thrombus. Measurements of the increase
intensity units within the portal veins suggest enhancement however.
Furthermore given the elevation in AFP underlying hepatocellular
carcinoma with tumor thrombus in the portal vein is suspected.

Multiple stones are identified within the gallbladder which measure
up to 9 mm. The common bile duct is mildly increased in caliber
measuring up to 7 mm. No choledocholithiasis identified.

Pancreas: No mass, inflammatory changes, or other parenchymal
abnormality identified.

Spleen: The spleen is upper limits of normal measuring 12.5 by
x 4.9 cm (volume = 400 cm^3). No focal splenic abnormality.

Adrenals/Urinary Tract: There is an enhancing nodule within the
right adrenal gland which is new from [DATE]. This measures
cm, image 32/8. This is a new finding compared with [DATE] and
within the limitations of motion artifact does not meet MR criteria
for adenopathy. Left adrenal gland is not confidently identified
which may reflect presence of the large left upper quadrant
varicosities. Bilateral simple appearing kidney cysts are
identified. No follow-up recommended. No hydronephrosis identified
bilaterally.

Stomach/Bowel: Visualized portions within the abdomen are
unremarkable.

Vascular/Lymphatic: Normal appearance of the abdominal aorta. No
aneurysm. Thrombosis of the middle and right branches of the portal
vein identified with tumor thrombus extending into the extrahepatic
portal vein. No thrombus identified within the portal venous
confluence or SMV. Large left upper quadrant varicosities. No
adenopathy identified within the upper abdomen.

Other:  No focal fluid collections or free fluid identified.

Musculoskeletal: No suspicious bone lesions identified.
IMPRESSION: 1. Morphologic features of liver compatible with cirrhosis. Stigmata
of portal venous hypertension identified including splenomegaly and
large left upper quadrant varicosities.
2. Expansile thrombus involving the middle and right intrahepatic
branches of the portal vein. Despite marked motion artifact and
suboptimal bolus timing of the contrast administration there is
measurable enhancement within the portal vein thrombus, which is
concerning for tumor thrombus likely secondary to HCC.
3. Evaluation for underlying enhancing liver lesions (i.e. HCC) is
significantly limited due to suboptimal timing of the contrast bolus
as well as excessive motion artifact. Within this limitation, given
the elevated AFP and presence of enhancing thrombus within the
portal vein I am highly concerning for underlying a hepatocellular
carcinoma. Recommend contrast enhanced liver protocol CT for further
evaluation. This should be less susceptible to motion artifact and
may allow for more optimal contrast timing.
4. New 1.8 cm enhancing nodule within the right adrenal gland is
identified. This is a new finding compared with [DATE]. Cannot
exclude metastatic disease.
5. Gallstones. Mild increase in caliber of the common bile duct
without evidence for choledocholithiasis.

## 2021-10-12 MED ORDER — MORPHINE SULFATE (PF) 2 MG/ML IV SOLN: 2.0000 mg | INTRAVENOUS | Status: DC | PRN

## 2021-10-12 MED ORDER — GADOBUTROL 1 MMOL/ML IV SOLN
7.5000 mL | Freq: Once | INTRAVENOUS | Status: AC | PRN
  Administered 2021-10-12: 7.5 mL via INTRAVENOUS

## 2021-10-12 MED ORDER — ACETAMINOPHEN 650 MG RE SUPP: 650.0000 mg | Freq: Four times a day (QID) | RECTAL | Status: DC | PRN

## 2021-10-12 MED ORDER — FLUOXETINE HCL 20 MG PO CAPS
40.0000 mg | ORAL_CAPSULE | Freq: Every day | ORAL | Status: DC
  Administered 2021-10-12 – 2021-10-18 (×7): 40 mg via ORAL
  Filled 2021-10-12 (×7): qty 2

## 2021-10-12 MED ORDER — SODIUM CHLORIDE 0.9 % IV SOLN
1.0000 g | INTRAVENOUS | Status: DC
  Filled 2021-10-12: qty 1

## 2021-10-12 MED ORDER — ACETAMINOPHEN 325 MG PO TABS: 650.0000 mg | ORAL_TABLET | Freq: Four times a day (QID) | ORAL | Status: DC | PRN

## 2021-10-12 MED ORDER — CARVEDILOL 12.5 MG PO TABS
12.5000 mg | ORAL_TABLET | Freq: Two times a day (BID) | ORAL | Status: DC
  Administered 2021-10-12 – 2021-10-18 (×12): 12.5 mg via ORAL
  Filled 2021-10-12 (×13): qty 1

## 2021-10-12 MED ORDER — HEPARIN SODIUM (PORCINE) 5000 UNIT/ML IJ SOLN
5000.0000 [IU] | Freq: Three times a day (TID) | INTRAMUSCULAR | Status: DC
  Administered 2021-10-12: 5000 [IU] via SUBCUTANEOUS
  Filled 2021-10-12 (×7): qty 1

## 2021-10-12 MED ORDER — SODIUM CHLORIDE 0.9 % IV SOLN: INTRAVENOUS | Status: DC

## 2021-10-12 MED ORDER — SODIUM CHLORIDE 0.9 % IV SOLN
500.0000 mg | INTRAVENOUS | Status: DC
  Administered 2021-10-12 – 2021-10-13 (×2): 500 mg via INTRAVENOUS
  Filled 2021-10-12 (×2): qty 0.5

## 2021-10-12 MED ORDER — ONDANSETRON HCL 4 MG/2ML IJ SOLN: 4.0000 mg | Freq: Four times a day (QID) | INTRAMUSCULAR | Status: DC | PRN

## 2021-10-12 MED ORDER — OXYCODONE HCL 5 MG PO TABS: 5.0000 mg | ORAL_TABLET | ORAL | Status: DC | PRN

## 2021-10-12 MED ORDER — MAGNESIUM SULFATE 2 GM/50ML IV SOLN
2.0000 g | Freq: Once | INTRAVENOUS | Status: AC
  Administered 2021-10-12: 2 g via INTRAVENOUS
  Filled 2021-10-12: qty 50

## 2021-10-12 MED ORDER — ONDANSETRON HCL 4 MG PO TABS: 4.0000 mg | ORAL_TABLET | Freq: Four times a day (QID) | ORAL | Status: DC | PRN

## 2021-10-12 NOTE — H&P (Signed)
?History and Physical  ? ? ?Patient: Alyssa Pena:[id number] DOB: December 22, 1950 ?DOA: 10/11/2021 ?DOS: the patient was seen and examined on 10/12/2021 ?PCP: Neale Burly, MD  ?Patient coming from: Home ? ?Chief Complaint:  ?Chief Complaint  ?Patient presents with  ? Fever  ? ?HPI: Alyssa Pena is a 71 y.o. female with medical history significant of [place] dependence, alcoholic hepatitis, CHF, cirrhosis, depression, hypertension, portal venous hypertension, thrombocytopenia, and more presents ED with a chief complaint of fever.  Patient recently saw GI clinic on 25 April.  She was referred there for possible ERCP.  Patient has been experiencing intermittent epigastric pain radiating to her right upper quadrant and posteriorly for the last 1 month.  She also had nausea and vomiting.  An ultrasound was ordered on the end of March that showed hepatic cirrhosis and right hepatic cyst, thrombus in the portal vein thought to be chronic.  He also revealed a dilated common bile duct at 10 mm with cholelithiasis.  Patient also has a history of positive HCV antibody.  Today patient reports a Tmax of 104.  She did not take Tylenol or ibuprofen for the fever secondary to her cirrhosis.  She had no fever at presentation.  Patient does report right upper quadrant pain.  Is not painful at rest.  Attempting to take p.o. makes the pain worse.  Her last normal meal was the 26th around 5:30 PM.  Her last normal bowel movement is unknown.  Patient reports she has had diarrhea for several days.  She had nausea and 1 episode of emesis.  It was nonbloody.  Every time she tries to eat she feels nauseous.  Patient is not sure if she had jaundice the past couple of days or not.  Her sister is her primary historian and is not at bedside at the time of my exam.  Patient does report that she has had dysuria and thought she had a UTI.  It started last week, around the same time that the fever started.  She has no hematuria, frequency, or urgency.   She was not started on an antibiotic in the outpatient oral.  Patient has no other complaints at this time. ? ?Patient does not smoke, does not drink alcohol-she quit 2 years ago.  She used to use marijuana and cocaine in her youth, but has not in the last several decades.  She did get her COVID shot.  Patient is DNR. ?Review of Systems: As mentioned in the history of present illness. All other systems reviewed and are negative. ?Past Medical History:  ?Diagnosis Date  ? Alcohol dependence ([place]) 08/10/2013  ? Alcoholic hepatitis 4/68/0321  ? Anxiety   ? Arthritis   ? Bruises easily   ? CHF (congestive heart failure) ([place])   ? Cirrhosis ([place]) 08/10/2013  ? Depression   ? Dysrhythmia   ? before medication  ? Fluid retention   ? Full dentures   ? upper  ? Hypertension   ? Portal venous hypertension ([place]) 08/10/2013  ? Renal disorder   ? Thrombocytopenia ([place]) 08/10/2013  ? ?Past Surgical History:  ?Procedure Laterality Date  ? ABDOMINAL HYSTERECTOMY    ? BREAST SURGERY    ? lumpectomy benign  ? CATARACT EXTRACTION W/PHACO Left 08/30/2016  ? Procedure: CATARACT EXTRACTION PHACOEMULSIFICATION AND INTRAOCULAR LENS PLACEMENT LEFT EYE CDE - 46.93;  Surgeon: Baruch Goldmann, MD;  Location: [facility];  Service: Ophthalmology;  Laterality: Left;  left  ? CATARACT EXTRACTION W/PHACO Right 06/20/2017  ?  Procedure: CATARACT EXTRACTION PHACO AND INTRAOCULAR LENS PLACEMENT RIGHT EYE;  Surgeon: Baruch Goldmann, MD;  Location: [facility];  Service: Ophthalmology;  Laterality: Right;  right  ? CYSTOSCOPY WITH RETROGRADE PYELOGRAM, URETEROSCOPY AND STENT PLACEMENT Right 10/17/2018  ? Procedure: [place], URETEROSCOPY AND STENT PLACEMENT;  Surgeon: Ardis Hughs, MD;  Location: [facility];  Service: Urology;  Laterality: Right;  ? CYSTOSCOPY/URETEROSCOPY/HOLMIUM LASER/STENT PLACEMENT N/A 01/04/2019  ? Procedure: CYSTOSCOPY/URETEROSCOPY/STENT PLACEMENT;  Surgeon: Raynelle Bring, MD;  Location: [facility];  Service: Urology;   Laterality: N/A;  ? IR URETERAL STENT RIGHT NEW ACCESS W/O SEP NEPHROSTOMY CATH  12/24/2018  ? KIDNEY STONE SURGERY    ? NEPHROLITHOTOMY Right 12/24/2018  ? Procedure: NEPHROLITHOTOMY PERCUTANEOUS;  Surgeon: Raynelle Bring, MD;  [facility]: [facility];  Service: Urology;  Laterality: Right;  ? URETEROSCOPY WITH HOLMIUM LASER LITHOTRIPSY Right 12/24/2018  ? Procedure: URETEROSCOPY;  Surgeon: Raynelle Bring, MD;  [facility];  Service: Urology;  Laterality: Right;  ? ?Social History:  reports that she has quit smoking. Her smoking use included cigarettes. She has never used smokeless tobacco. She reports that she does not currently use alcohol. She reports that she does not currently use drugs. ? ?Allergies  ?Allergen Reactions  ? Penicillins Hives  ? Tape   ?  Bruises her skin  ? ? ?Family History  ?Problem Relation Age of Onset  ? Lung cancer Father   ? Lung cancer Brother   ? COPD Brother   ? Dementia Mother   ? Arthritis Sister   ? High blood pressure Sister   ? COPD Sister   ? ? ?Prior to Admission medications   ?Medication Sig Start Date End Date Taking? Authorizing Provider  ?albuterol (VENTOLIN HFA) 108 (90 Base) MCG/ACT inhaler Inhale 1-2 puffs into the lungs every 6 (six) hours as needed for wheezing or shortness of breath. 06/23/20  Yes Marney Setting, NP  ?benazepril (LOTENSIN) 5 MG tablet Take 5 mg by mouth daily. 09/04/21  Yes [provider]  ?carvedilol (COREG) 12.5 MG tablet Take 12.5 mg by mouth 2 (two) times daily. 04/21/18  Yes [provider]  ?dicyclomine (BENTYL) 10 MG capsule Take 10 mg by mouth daily. 09/04/21  Yes [provider]  ?FLUoxetine (PROZAC) 40 MG capsule Take 40 mg by mouth daily. 03/08/21  Yes [provider]  ?furosemide (LASIX) 20 MG tablet Take 20 mg by mouth daily. 05/06/19  Yes [provider]  ?omeprazole (PRILOSEC) 20 MG capsule Take 20 mg by mouth daily.   Yes [provider]  ?ondansetron (ZOFRAN) 4 MG tablet Take 4 mg by  mouth every 4 (four) hours as needed. 09/16/21  Yes [provider]  ?ondansetron (ZOFRAN-ODT) 4 MG disintegrating tablet Take 4 mg by mouth every 4 (four) hours as needed for nausea or vomiting.   Yes [provider]  ?psyllium (METAMUCIL SMOOTH TEXTURE) 58.6 % powder Take 1 packet by mouth at bedtime. 10/09/21  Yes Rehman, Mechele Dawley, MD  ? ? ?Physical Exam: ?Vitals:  ? 10/12/21 0339 10/12/21 0343 10/12/21 0400 10/12/21 0530  ?BP: (!) 103/45  (!) 118/47 (!) 113/50  ?Pulse: (!) 59  61 (!) 54  ?Resp: 18  19 19   ?Temp:  98.1 ?F (36.7 ?C)    ?TempSrc:  Oral    ?SpO2: 96%  94% 94%  ?Weight:      ?Height:      ? ?1.  General: ?Patient lying supine in bed, chronically ill-appearing2. Psychiatric: ?Alert  and oriented x 3, mood and behavior normal for situation, pleasant and cooperative with exam ?  ?3. Neurologic: ?Speech and language are normal, face is symmetric, moves all 4 extremities voluntarily, at baseline without acute deficits on limited exam ?  ?4. HEENMT:  ?Head is atraumatic, normocephalic, pupils reactive to light, neck is supple, trachea is midline, mucous membranes are moist, hoarse voice ?  ?5. Respiratory : ?Lungs are clear to auscultation bilaterally without wheezing, rhonchi, rales, no cyanosis, no increase in work of breathing or accessory muscle use ?  ?6. Cardiovascular : ?Heart rate normal, rhythm is regular, no murmurs, rubs or gallops, no peripheral edema, peripheral pulses palpated ?  ?7. Gastrointestinal:  ?Abdomen is soft, nondistended, tender to palpation in the right upper quadrant, bowel sounds active, no masses or organomegaly palpated ?  ?8. Skin:  ?Skin is warm, dry and intact without rashes, it is dull and jaundiced ?  ?9.Musculoskeletal:  ?No acute deformities or trauma, no asymmetry in tone, no peripheral edema, peripheral pulses palpated, no tenderness to palpation in the extremities ? ?Data Reviewed: ?In the ED ?Temp 97.8, heart rate 60-77, respiratory rate 16-25, blood  pressure 97/52-116/75, satting 92-97% on room air ?No leukocytosis, thrombocytopenia 103 ?Chemistry reveals a bump in BUN at 30, bump in creatinine 2.55 ?Alk phos 147, AST 109, ALT 51 ?Albumin 1.7 ?T. bili 4

## 2021-10-12 NOTE — ED Notes (Signed)
Provider at bedside

## 2021-10-12 NOTE — Assessment & Plan Note (Signed)
-  Albumin 1.7 ?-Secondary to cirrhosis and poor p.o. intake ?-Encourage nutrient dense food choices when patient is able to take p.o. ?-Consider adding protein shake between meals ?-Continue to monitor ?

## 2021-10-12 NOTE — ED Notes (Signed)
Patient transported to MRI 

## 2021-10-12 NOTE — Assessment & Plan Note (Signed)
Continue Coreg ?Holding ACE in the setting of AKI ?

## 2021-10-12 NOTE — Telephone Encounter (Signed)
FYI: Patient sister Mardene Celeste called to let Dr. Laural Golden know that the patient's temp got up to 101.6 and she was vomiting, so she did as Dr. Dereck Leep had instructed and took her to [facility] ED, where they have admitted her.  ?

## 2021-10-12 NOTE — Assessment & Plan Note (Signed)
-   Fever of 104 at home, right upper quadrant pain, T. bili 4.1 ?-Patient was told by GI office to present to the ED if she develops a fever ?-GI consulted from the ED and reported to start her on ertapenem (penicillin allergy) and they will see in the a.m. ?-Continue ertapenem ?-N.p.o. in case EGD or ERCP needs to be done ?-MRCP ordered for a.m. ?-Continue to monitor ?

## 2021-10-12 NOTE — Assessment & Plan Note (Signed)
Alk phos 147, AST 109 up from 91, ALT 51 up from 42, T. bili 4.1 ?Ductal dilatation of CBD had been seen recently, but on today's CT there is no ductal dilatation ?Concern for cholangitis given fever, jaundice, right upper quadrant pain-and this could be contributing to the elevated liver function tests ?Continue treatment under cholangitis ?Trend in the a.m. ?

## 2021-10-12 NOTE — ED Provider Notes (Signed)
Spoke with Dr. Clearence Ped, Hospitalist, who will evaluate for admission.  ?  ?Truddie Hidden, MD ?10/12/21 [phone] ? ?

## 2021-10-12 NOTE — ED Notes (Signed)
Purple armband placed on pt 

## 2021-10-12 NOTE — Assessment & Plan Note (Signed)
-  AKI with creatinine baseline 1.0, creatinine today 2.5 ?-Avoid nephrotoxic agents when possible ?-Continue gentle IV hydration ?-Continue to monitor ?

## 2021-10-12 NOTE — Consult Note (Signed)
? ?Gastroenterology Consult  ? ?Referring Provider: No ref. provider found ?Primary Care Physician:  Neale Burly, MD ?Primary Gastroenterologist:  Dr. Laural Golden ? ?Patient ID: Alyssa Pena; [id number]; 03-02-1951  ? ?Admit date: 10/11/2021 ? LOS: 0 days  ? ?Date of Consultation: 10/12/2021 ? ?Reason for Consultation:  cholelithiasis, RUQ, possible cholangitis ? ?History of Present Illness  ? ?Alyssa Pena is a 71 y.o. year old female with history of alcohol abuse, alcoholic hepatitis and cirrhosis, hep C, chronic thrombocytopenia, Anxiety, CHF, depression, HTN, kidney stones s/p nephrolithotomy with ureteroscopy with lithotripsy in 2010.  ? ?Patient recently seen by Dr. Laural Golden in his office on 4/25 for consultation of epigastric and upper right quadrant pain and dilated CBD.  She had recent ultrasound on 09/14/2021 with hepatic cirrhosis with small right hepatic cyst, thrombus in the portal vein felt to be chronic, cholelithiasis without cholecystitis but CBD dilated to 10 mm.  She had been having intermittent pain every couple of days, lack of appetite, and weight loss.  She had also been complaining of diarrhea and incontinence which stool studies and cultures were negative.  She had been taking dicyclomine and then became constipated.  Diagnosed with cirrhosis in February 2015 when she was found to be jaundiced and also found to be hep C positive.  Reported no alcohol use since August 2021 however has a 35-year history of drinking, with the latter years drinking 1 pint of liquor every day.  She was advised Metamucil daily at bedtime, dicyclomine 10 mg 3 minutes before breakfast daily, advised to go have lab work done and MRCP ASAP. ? ?Never had colonoscopy. ? ?Labs 10/09/21: Hgb 10.5, platelets 114, INR 1.2, AFP elevated to 423.6, creatinine 1.7, T. bili 4.1, alk phos 172, AST 91, ALT 42, HCV pending ? ?ED Course: ? - Presented yesterday evening due to reported fever at home of 101.6, and vomiting for 2 weeks.  She  presented to the ER due to fever as instructed by Dr. Laural Golden.  ? - CT A/P: Cirrhosis, no focal lesion unenhanced CT, layering gallstones without associated inflammatory change, no intrahepatic or extrahepatic ductal dilation, 11 mm right renal cyst, 10 mm left renal calculi, no hydronephrosis, no colonic wall thickening, normal appendix ?-Labs: Hgb 10.9, platelets 103, creatinine 2.55, AST 109, ALT 51, alk phos 147, T. bili 4.1, ammonia 30, INR 1.2, ethanol level less than 10 ?-UA with leukocytes, no bacteria, proteinuria, urine culture pending ? ? ?Today: ?Pain for about 1 month to upper abdomen. Has had nausea, vomiting and diarrhea that kept coming then started running a fever within the last few days. No N/V today. Pain right now is tolerable, has not received any pain medications. GERD controlled with omeprazole. Stools are watery, going about twice a day, teacup amount (slush). Appetite has been decreased. Feels like she noticed her skin be more yellow about 2-3 weeks ago. Reports August 2021 was her last drink. No smoking. Has been using preparation H for quite some time for hemorrhoids. Since she has had a fever she feels like she has been more forgetful or confused.  ? ? ?Past Medical History:  ?Diagnosis Date  ? Alcohol dependence ([place]) 08/10/2013  ? Alcoholic hepatitis 5/85/2778  ? Anxiety   ? Arthritis   ? Bruises easily   ? CHF (congestive heart failure) ([place])   ? Cirrhosis ([place]) 08/10/2013  ? Depression   ? Dysrhythmia   ? before medication  ? Fluid retention   ? Full dentures   ?  upper  ? Hypertension   ? Portal venous hypertension ([place]) 08/10/2013  ? Renal disorder   ? Thrombocytopenia ([place]) 08/10/2013  ? ? ?Past Surgical History:  ?Procedure Laterality Date  ? ABDOMINAL HYSTERECTOMY    ? BREAST SURGERY    ? lumpectomy benign  ? CATARACT EXTRACTION W/PHACO Left 08/30/2016  ? Procedure: CATARACT EXTRACTION PHACOEMULSIFICATION AND INTRAOCULAR LENS PLACEMENT LEFT EYE CDE - 46.93;  Surgeon: Baruch Goldmann,  MD;  Location: [facility];  Service: Ophthalmology;  Laterality: Left;  left  ? CATARACT EXTRACTION W/PHACO Right 06/20/2017  ? Procedure: CATARACT EXTRACTION PHACO AND INTRAOCULAR LENS PLACEMENT RIGHT EYE;  Surgeon: Baruch Goldmann, MD;  Location: [facility];  Service: Ophthalmology;  Laterality: Right;  right  ? CYSTOSCOPY WITH RETROGRADE PYELOGRAM, URETEROSCOPY AND STENT PLACEMENT Right 10/17/2018  ? Procedure: [place], URETEROSCOPY AND STENT PLACEMENT;  Surgeon: Ardis Hughs, MD;  Location: [facility];  Service: Urology;  Laterality: Right;  ? CYSTOSCOPY/URETEROSCOPY/HOLMIUM LASER/STENT PLACEMENT N/A 01/04/2019  ? Procedure: CYSTOSCOPY/URETEROSCOPY/STENT PLACEMENT;  Surgeon: Raynelle Bring, MD;  [facility];  Service: Urology;  Laterality: N/A;  ? IR URETERAL STENT RIGHT NEW ACCESS W/O SEP NEPHROSTOMY CATH  12/24/2018  ? KIDNEY STONE SURGERY    ? NEPHROLITHOTOMY Right 12/24/2018  ? Procedure: NEPHROLITHOTOMY PERCUTANEOUS;  Surgeon: Raynelle Bring, MD;  Location: [facility];  Service: Urology;  Laterality: Right;  ? URETEROSCOPY WITH HOLMIUM LASER LITHOTRIPSY Right 12/24/2018  ? Procedure: URETEROSCOPY;  Surgeon: Raynelle Bring, MD;  [facility];  Service: Urology;  Laterality: Right;  ? ? ?Prior to Admission medications   ?Medication Sig Start Date End Date Taking? Authorizing Provider  ?albuterol (VENTOLIN HFA) 108 (90 Base) MCG/ACT inhaler Inhale 1-2 puffs into the lungs every 6 (six) hours as needed for wheezing or shortness of breath. 06/23/20  Yes Marney Setting, NP  ?benazepril (LOTENSIN) 5 MG tablet Take 5 mg by mouth daily. 09/04/21  Yes [provider]  ?carvedilol (COREG) 12.5 MG tablet Take 12.5 mg by mouth 2 (two) times daily. 04/21/18  Yes [provider]  ?dicyclomine (BENTYL) 10 MG capsule Take 10 mg by mouth daily. 09/04/21  Yes [provider]  ?FLUoxetine (PROZAC) 40 MG capsule Take 40 mg by mouth daily. 03/08/21  Yes [provider]   ?furosemide (LASIX) 20 MG tablet Take 20 mg by mouth daily. 05/06/19  Yes [provider]  ?omeprazole (PRILOSEC) 20 MG capsule Take 20 mg by mouth daily.   Yes [provider]  ?ondansetron (ZOFRAN) 4 MG tablet Take 4 mg by mouth every 4 (four) hours as needed. 09/16/21  Yes [provider]  ?ondansetron (ZOFRAN-ODT) 4 MG disintegrating tablet Take 4 mg by mouth every 4 (four) hours as needed for nausea or vomiting.   Yes [provider]  ?psyllium (METAMUCIL SMOOTH TEXTURE) 58.6 % powder Take 1 packet by mouth at bedtime. 10/09/21  Yes Rehman, Mechele Dawley, MD  ? ? ?Current Facility-Administered Medications  ?Medication Dose Route Frequency Provider Last Rate Last Admin  ? 0.9 %  sodium chloride infusion   Intravenous Continuous Zierle-Ghosh, Asia B, DO 75 mL/hr at 10/12/21 0334 New Bag at 10/12/21 0334  ? acetaminophen (TYLENOL) tablet 650 mg  650 mg Oral Q6H PRN Zierle-Ghosh, Asia B, DO      ? Or  ? acetaminophen (TYLENOL) suppository 650 mg  650 mg Rectal Q6H PRN Zierle-Ghosh, Asia B, DO      ? carvedilol (COREG) tablet 12.5 mg  12.5 mg Oral BID  Zierle-Ghosh, Asia B, DO      ? ertapenem (INVANZ) 1,000 mg in sodium chloride 0.9 % 100 mL IVPB  1 g Intravenous Q24H Zierle-Ghosh, Asia B, DO      ? FLUoxetine (PROZAC) capsule 40 mg  40 mg Oral Daily Zierle-Ghosh, Asia B, DO      ? heparin injection 5,000 Units  5,000 Units Subcutaneous Q8H Zierle-Ghosh, Asia B, DO   5,000 Units at 10/12/21 0612  ? iohexol (OMNIPAQUE) 9 MG/ML oral solution           ? magnesium sulfate IVPB 2 g 50 mL  2 g Intravenous Once Phillips Grout, MD      ? morphine (PF) 2 MG/ML injection 2 mg  2 mg Intravenous Q2H PRN Zierle-Ghosh, Asia B, DO      ? ondansetron (ZOFRAN) tablet 4 mg  4 mg Oral Q6H PRN Zierle-Ghosh, Asia B, DO      ? Or  ? ondansetron (ZOFRAN) injection 4 mg  4 mg Intravenous Q6H PRN Zierle-Ghosh, Asia B, DO      ? oxyCODONE (Oxy IR/ROXICODONE) immediate release tablet 5 mg  5 mg Oral Q4H PRN  Zierle-Ghosh, Asia B, DO      ? ?Current Outpatient Medications  ?Medication Sig Dispense Refill  ? albuterol (VENTOLIN HFA) 108 (90 Base) MCG/ACT inhaler Inhale 1-2 puffs into the lungs every 6 (six) hours as need

## 2021-10-12 NOTE — TOC Initial Note (Signed)
Transition of Care (TOC) - Initial/Assessment Note  ? ? ?Patient Details  ?Name: Alyssa Pena ?MRN: [id number] ?Date of Birth: 1950/06/28 ? ?Transition of Care (TOC) CM/SW Contact:    ?Shade Flood, LCSW ?Phone Number: ?10/12/2021, 2:36 PM ? ?Clinical Narrative:                 ? ?Pt admitted from home. She has a high readmission risk score. Met with pt and her sister at bedside to assess. Per pt, she lives alone but her sister has been staying with her the last month since she started feeling sick. Pt's sister has been assisting with ADLs as needed. When well, pt has been independent in ADLs at home. She is hoping to return home at dc. Pt's sister states that as long as pt can get up to the bathroom and dress herself, she can continue to assist with care either at pt's home or at her home in [place]. Discussed options of SNF rehab vs HH if needed at dc. Will ask MD for PT eval when pt stable. ? ?TOC will follow and continue to assess and assist with dc planning. ? ? ?Expected Discharge Plan: [place] ?Barriers to Discharge: Continued Medical Work up ? ? ?Patient Goals and CMS Choice ?Patient states their goals for this hospitalization and ongoing recovery are:: go home ?  ?  ? ?Expected Discharge Plan and Services ?Expected Discharge Plan: [place] ?In-house Referral: Clinical Social Work ?  ?  ?Living arrangements for the past 2 months: [place] ?                ?  ?  ?  ?  ?  ?  ?  ?  ?  ?  ? ?Prior Living Arrangements/Services ?Living arrangements for the past 2 months: [place] ?Lives with:: Self ?Patient language and need for interpreter reviewed:: Yes ?Do you feel safe going back to the place where you live?: Yes      ?Need for Family Participation in Patient Care: Yes (Comment) ?Care giver support system in place?: Yes (comment) ?  ?Criminal Activity/Legal Involvement Pertinent to Current Situation/Hospitalization: No - Comment as  needed ? ?Activities of Daily Living ?Home Assistive Devices/Equipment: None ?ADL Screening (condition at time of admission) ?Patient's cognitive ability adequate to safely complete daily activities?: Yes ?Is the patient deaf or have difficulty hearing?: No ?Does the patient have difficulty seeing, even when wearing glasses/contacts?: No ?Does the patient have difficulty concentrating, remembering, or making decisions?: No ?Patient able to express need for assistance with ADLs?: Yes ?Does the patient have difficulty dressing or bathing?: No ?Independently performs ADLs?: Yes (appropriate for developmental age) ?Does the patient have difficulty walking or climbing stairs?: No ?Weakness of Legs: None ?Weakness of Arms/Hands: None ? ?Permission Sought/Granted ?  ?  ?   ?   ?   ?   ? ?Emotional Assessment ?Appearance:: Appears stated age ?Attitude/Demeanor/Rapport: Engaged ?Affect (typically observed): Flat ?Orientation: : Oriented to Self, Oriented to Place, Oriented to  Time, Oriented to Situation ?Alcohol / Substance Use: Not Applicable ?Psych Involvement: No (comment) ? ?Admission diagnosis:  Ascending cholangitis [K83.09] ?AKI (acute kidney injury) ([place]) [N17.9] ?Patient Active Problem List  ? Diagnosis Date Noted  ? Cholangitis 10/12/2021  ? Elevated liver function tests 10/12/2021  ? Hypoalbuminemia 10/12/2021  ? RUQ pain 10/09/2021  ? Dilated bile duct 10/09/2021  ? Fecal incontinence 10/09/2021  ? Renal calculus 12/24/2018  ?  Ureteral calculus, right   ? Benign essential HTN   ? AKI (acute kidney injury) ([place]) 10/16/2018  ? Hepatitis C antibody test positive 08/12/2013  ? Diastolic CHF ([place]) 47/34/0370  ? Pulmonary hypertension ([place]) 08/12/2013  ? Hypokalemia 08/11/2013  ? IV drug user history 08/11/2013  ? Cirrhosis ([place]) 08/10/2013  ? Alcoholic hepatitis 96/43/8381  ? Portal venous hypertension ([place]) 08/10/2013  ? Thrombocytopenia ([place]) 08/10/2013  ? Alcohol dependence ([place]) 08/10/2013  ? ?PCP:  Neale Burly, MD ?Pharmacy:   ?[facility] [phone] - [place], [place] AT [place] ?[address] ?[place]  [address] ?Phone: [phone] Fax: [phone] ? ? ? ? ?Social Determinants of Health (SDOH) Interventions ?  ? ?Readmission Risk Interventions ? ?  10/12/2021  ?  2:36 PM  ?Readmission Risk Prevention Plan  ?Transportation Screening Complete  ?[place] or Home Care Consult Complete  ?Social Work Consult for [place] Planning/Counseling Complete  ?Palliative Care Screening Not Applicable  ?Medication Review Press photographer) Complete  ? ? ? ?

## 2021-10-12 NOTE — Progress Notes (Signed)
Initial Nutrition Assessment ? ?DOCUMENTATION CODES:  ? ?Non-severe (moderate) malnutrition in context of chronic illness ? ?INTERVENTION:  ?When patient is cleared for diet advancement will add appropriate ONS ? ?Education provided ? ?NUTRITION DIAGNOSIS:  ? ?Moderate Malnutrition related to chronic illness (Cirrhosis, CHF) as evidenced by energy intake < 75% for > or equal to 1 month, moderate muscle depletion, mild muscle depletion, mild fat depletion, moderate fat depletion. ? ? ?GOAL:  ?Patient will meet greater than or equal to 90% of their needs ? ? ?MONITOR:  ?Diet advancement, Supplement acceptance, PO intake, Weight trends, Skin, Labs ? ?REASON FOR ASSESSMENT:  ? ?Malnutrition Screening Tool ?  ? ?ASSESSMENT: Patient is a 71 yo female with hx of CHF, cirrhosis, anxiety, depression, hypertension. Presents from home with fever, epigastric pain, nausea, vomiting and diarrhea. AKI, Elevated LFT, Cholangitis. Abdominal pain suspicious for hepatocellular carcinoma per GI. Oncology consult pending.  ? ?Sister is bedside. Patient eating poorly for the past month. Recall: eating only 1 meal daily and snacking on Little Debbie cakes. Uses mainly frozen meals and likes spaghetti and meatballs and salisbury steak with potatoes. Educated pt regarding high sodium content of many frozen meals and encouraged label reading.   ? ?Discussed with her importance of eating more healthfully- adding fruits and vegetables. She likes greens. Encouraged protein foods throughout the day and talked about good choices-milk, greek yogurt, eggs, lean meats.  ? ?Medications reviewed.  ? ?Weight encounters: 79-86 kg range the past 2+ years. Currently-79.3 kg. Decrease of 6% x 4 months-trending toward significant.  ?.  ?IVF-NS-@ 75 ml/hr ? ?Labs: ? ?  Latest Ref Rng & Units 10/12/2021  ?  3:33 AM 10/11/2021  ?  8:38 PM 10/09/2021  ? 10:12 AM  ?BMP  ?Glucose 70 - 99 mg/dL 128   135   174    ?BUN 8 - 23 mg/dL 33   30   19    ?Creatinine 0.44 -  1.00 mg/dL 2.53   2.55   1.70    ?BUN/Creat Ratio 6 - 22 (calc)   11    ?Sodium 135 - 145 mmol/L 134   133   135    ?Potassium 3.5 - 5.1 mmol/L 3.9   3.8   3.6    ?Chloride 98 - 111 mmol/L 105   104   104    ?CO2 22 - 32 mmol/L '23   21   23    '$ ?Calcium 8.9 - 10.3 mg/dL 8.0   8.2   8.3    ?   ? ?NUTRITION - FOCUSED PHYSICAL EXAM: ? ?Flowsheet Row Most Recent Value  ?Orbital Region Moderate depletion  ?Upper Arm Region Mild depletion  ?Thoracic and Lumbar Region No depletion  ?Buccal Region Moderate depletion  ?Temple Region Mild depletion  ?Clavicle Bone Region Moderate depletion  ?Clavicle and Acromion Bone Region Mild depletion  ?Dorsal Hand Moderate depletion  ?Patellar Region No depletion  ?Anterior Thigh Region No depletion  ?Posterior Calf Region Mild depletion  ?Edema (RD Assessment) Mild  ?Hair Reviewed  ?Eyes Reviewed  ?Mouth Reviewed  ?Skin Reviewed  ?Nails Reviewed  ? ?  ? ? ?Diet Order:   ?Diet Order   ? ?       ?  Diet NPO time specified Except for: Sips with Meds  Diet effective now       ?  ? ?  ?  ? ?  ? ? ?EDUCATION NEEDS:  ?Education needs have been addressed ? ?Skin:  Skin Assessment: Reviewed RN Assessment ? ?Last BM:  4/28 ? ?Height:  ? ?Ht Readings from Last 1 Encounters:  ?10/12/21 '5\' 5"'$  (1.651 m)  ? ? ?Weight:  ? ?Wt Readings from Last 1 Encounters:  ?10/12/21 79.3 kg  ? ? ?Ideal Body Weight:   57 kg ? ?BMI:  Body mass index is 29.1 kg/m?. ? ?Estimated Nutritional Needs:  ? ?Kcal:  1700-1800 ? ?Protein:  85-91 gr ? ?Fluid:  1.7-1.8 liters daily ? ?Colman Cater MS,RD,CSG,LDN ?Contact: AMION  ?

## 2021-10-12 NOTE — Assessment & Plan Note (Signed)
-  Secondary to history of alcohol dependence ?-Quit drinking 2 years ago ?-Holding home Lasix secondary to AKI ?-Continue to monitor ?

## 2021-10-13 DIAGNOSIS — N179 Acute kidney failure, unspecified: Secondary | ICD-10-CM | POA: Diagnosis not present

## 2021-10-13 DIAGNOSIS — E440 Moderate protein-calorie malnutrition: Secondary | ICD-10-CM | POA: Insufficient documentation

## 2021-10-13 LAB — CBC
HCT: 25.7 % — ABNORMAL LOW (ref 36.0–46.0)
Hemoglobin: 8.8 g/dL — ABNORMAL LOW (ref 12.0–15.0)
MCH: 32.5 pg (ref 26.0–34.0)
MCHC: 34.2 g/dL (ref 30.0–36.0)
MCV: 94.8 fL (ref 80.0–100.0)
Platelets: 84 10*3/uL — ABNORMAL LOW (ref 150–400)
RBC: 2.71 MIL/uL — ABNORMAL LOW (ref 3.87–5.11)
RDW: 14.8 % (ref 11.5–15.5)
WBC: 8.7 10*3/uL (ref 4.0–10.5)
nRBC: 0 % (ref 0.0–0.2)

## 2021-10-13 LAB — COMPREHENSIVE METABOLIC PANEL
ALT: 41 U/L (ref 0–44)
AST: 83 U/L — ABNORMAL HIGH (ref 15–41)
Albumin: <1.5 g/dL — ABNORMAL LOW (ref 3.5–5.0)
Alkaline Phosphatase: 120 U/L (ref 38–126)
Anion gap: 6 (ref 5–15)
BUN: 33 mg/dL — ABNORMAL HIGH (ref 8–23)
CO2: 22 mmol/L (ref 22–32)
Calcium: 8 mg/dL — ABNORMAL LOW (ref 8.9–10.3)
Chloride: 107 mmol/L (ref 98–111)
Creatinine, Ser: 1.89 mg/dL — ABNORMAL HIGH (ref 0.44–1.00)
GFR, Estimated: 28 mL/min — ABNORMAL LOW (ref >60)
Glucose, Bld: 92 mg/dL (ref 70–99)
Potassium: 3.5 mmol/L (ref 3.5–5.1)
Sodium: 135 mmol/L (ref 135–145)
Total Bilirubin: 3.7 mg/dL — ABNORMAL HIGH (ref 0.3–1.2)
Total Protein: 6 g/dL — ABNORMAL LOW (ref 6.5–8.1)

## 2021-10-13 LAB — PROTIME-INR
INR: 1.1 (ref 0.8–1.2)
Prothrombin Time: 14.2 seconds (ref 11.4–15.2)

## 2021-10-13 MED ORDER — MELATONIN 3 MG PO TABS
6.0000 mg | ORAL_TABLET | Freq: Every day | ORAL | Status: DC
  Administered 2021-10-13 – 2021-10-17 (×5): 6 mg via ORAL
  Filled 2021-10-13 (×5): qty 2

## 2021-10-13 NOTE — Progress Notes (Signed)
Patient  refused 10 pm dose and 6am dose of scheduled heparin, states she doesn't like the way it bruises her stomach. Physician notified. ? ?

## 2021-10-13 NOTE — Progress Notes (Signed)
?                                                  Progress note                                           ? ?Patient: Alyssa Pena JZP:[id number] DOB: 02-26-1951 ?DOA: 10/11/2021 ?DOS: the patient was seen and examined on 10/13/2021 ?PCP: Neale Burly, MD  ?Patient coming from: Home ? ?Chief Complaint:  ?Chief Complaint  ?Patient presents with  ? Fever  ? ?HPI: Alyssa Pena is a 71 y.o. female with medical history significant of Hall dependence, alcoholic hepatitis, CHF, cirrhosis, depression, hypertension, portal venous hypertension, thrombocytopenia, and more presents ED with a chief complaint of fever.  Patient recently saw GI clinic on 25 April.  She was referred there for possible ERCP.  Patient has been experiencing intermittent epigastric pain radiating to her right upper quadrant and posteriorly for the last 1 month.  She also had nausea and vomiting.  An ultrasound was ordered on the end of March that showed hepatic cirrhosis and right hepatic cyst, thrombus in the portal vein thought to be chronic.  He also revealed a dilated common bile duct at 10 mm with cholelithiasis.  Patient also has a history of positive HCV antibody.  Today patient reports a Tmax of 104.  She did not take Tylenol or ibuprofen for the fever secondary to her cirrhosis.  She had no fever at presentation.  Patient does report right upper quadrant pain.  Is not painful at rest.  Attempting to take p.o. makes the pain worse.  Her last normal meal was the 26th around 5:30 PM.  Her last normal bowel movement is unknown.  Patient reports she has had diarrhea for several days.  She had nausea and 1 episode of emesis.  It was nonbloody.  Every time she tries to eat she feels nauseous.  Patient is not sure if she had jaundice the past couple of days or not.  Her sister is her primary historian and is not at bedside at the time of my exam.  Patient does report that she has had dysuria and thought she had a UTI.  It started last week, around  the same time that the fever started.  She has no hematuria, frequency, or urgency.  She was not started on an antibiotic in the outpatient oral.  Patient has no other complaints at this time. ? ?Patient does not smoke, does not drink alcohol-she quit 2 years ago.  She used to use marijuana and cocaine in her youth, but has not in the last several decades.  She did get her COVID shot.  Patient is DNR. ? ?Subjective ?Feels better since arrival. ? ?Physical Exam: ?Vitals:  ? 10/12/21 1500 10/12/21 2008 10/13/21 0447 10/13/21 0906  ?BP: (!) 89/44 (!) 108/55 (!) 125/57 (!) 103/43  ?Pulse: 60 61 61 62  ?Resp: 19 18 18    ?Temp: 97.7 ?F (36.5 ?C) 98.5 ?F (36.9 ?C) 98 ?F (36.7 ?C)   ?TempSrc: Oral Oral Oral   ?SpO2: 96% 95% 96%   ?Weight:      ?Height:      ? ?1.  General: ?Patient lying supine in  bed, chronically ill-appearing ? ?2. Psychiatric: ?Alert and oriented x 3, mood and behavior normal for situation, pleasant and cooperative with exam ?  ?3. Neurologic: ?Speech and language are normal, face is symmetric, moves all 4 extremities voluntarily, at baseline without acute deficits on limited exam ?  ?4. HEENMT:  ?Head is atraumatic, normocephalic, pupils reactive to light, neck is supple, trachea is midline, mucous membranes are moist, hoarse voice ?  ?5. Respiratory : ?Lungs are clear to auscultation bilaterally without wheezing, rhonchi, rales, no cyanosis, no increase in work of breathing or accessory muscle use ?  ?6. Cardiovascular : ?Heart rate normal, rhythm is regular, no murmurs, rubs or gallops, no peripheral edema, peripheral pulses palpated ?  ?7. Gastrointestinal:  ?Abdomen is soft, nondistended, tender to palpation in the right upper quadrant, bowel sounds active, no masses or organomegaly palpated ?  ?8. Skin:  ?Skin is warm, dry and intact without rashes, it is dull and jaundiced ?  ?9.Musculoskeletal:  ?No acute deformities or trauma, no asymmetry in tone, no peripheral edema, peripheral pulses  palpated, no tenderness to palpation in the extremities ? ?Data Reviewed: ?In the ED ?Temp 97.8, heart rate 60-77, respiratory rate 16-25, blood pressure 97/52-116/75, satting 92-97% on room air ?No leukocytosis, thrombocytopenia 103 ?Chemistry reveals a bump in BUN at 30, bump in creatinine 2.55 ?Alk phos 147, AST 109, ALT 51 ?Albumin 1.7 ?T. bili 4.1 ?CT abdomen pelvis shows no bowel obstruction.  Cirrhosis.  Suspected cavernous transformation of portal vein with abdominal varices.  No ascites.  3.0 right adrenal nodule that could be metastasis and should be followed up with CT in 4 to 6 weeks.  No CBD ductal dilatation. ?Alcohol level less than 10 ?GI consulted from the ED and recommends starting ertapenem admit to [facility], they will see in the a.m. ?Assessment and Plan: ?* AKI (acute kidney injury) ([place]) ?-AKI with creatinine baseline 1.0, creatinine on admission 2.5 down to 1.89 ?-Avoid nephrotoxic agents when possible ?-Continue gentle IV hydration ?-Continue to monitor ? ?Hypoalbuminemia ?-Albumin 1.7 ?-Secondary to cirrhosis and poor p.o. intake ?-Encourage nutrient dense food choices when patient is able to take p.o. ? ?Elevated liver function tests ?Alk phos 147, AST 109 up from 91, ALT 51 up from 42, T. bili 4.1 ?Ductal dilatation of CBD had been seen recently, but on today's CT there is no ductal dilatation ?Concern for cholangitis given fever, jaundice, right upper quadrant pain-and this could be contributing to the elevated liver function tests ?Continue treatment under cholangitis ?Trend in the a.m. ? ?Cholangitis ?- Fever of 104 at home, right upper quadrant pain, T. bili 4.1 ?-Patient was told by GI office to present to the ED if she develops a fever ?-GI consulted from the ED and reported to start her on ertapenem (penicillin allergy) and they will follow ?-Continue ertapenem ?-MRCP  ?-Continue to monitor ? ?Benign essential HTN ?Continue Coreg ?Holding ACE in the setting of AKI ? ?Cirrhosis  ([place]) ?-Secondary to history of alcohol dependence ?-Quit drinking 2 years ago ?-Holding home Lasix secondary to AKI ?-Continue to monitor ? ?Concern for hepatocellular carcinoma ?-Elevated AFP ?-GI consulted for further work-up ?-High suspicion for hepatocellular carcinoma updated patient of this at the bedside will eventually need set up with oncology ? ? ? Advance Care Planning:   Code Status: DNR  ? ?Consults: GI ? ?Author: ?Erina Hamme A, MD ?10/13/2021 10:09 AM ? ?

## 2021-10-13 NOTE — Progress Notes (Signed)
Patient states right upper quadrant abdominal pain much better.  She is tolerating, and doing very well with, a regular diet ? ?Patient not a candidate for Eovist CT given CKD. ? ?Vital signs in last 24 hours: ?Temp:  [97.7 ?F (36.5 ?C)-98.5 ?F (36.9 ?C)] 98 ?F (36.7 ?C) (04/29 0447) ?Pulse Rate:  [54-62] 62 (04/29 0906) ?Resp:  [18-20] 18 (04/29 0447) ?BP: (89-125)/(43-66) 103/43 (04/29 0906) ?SpO2:  [95 %-97 %] 96 % (04/29 0447) ?Last BM Date : 10/13/21 ?General:   Frail, chronically ill-appearing lady in no acute distress.   ?Abdomen: Nondistended.  Positive bowel sounds soft and nontender without appreciable mass organomegaly ?Extremities:  Without clubbing or edema.   ? ?Intake/Output from previous day: ?04/28 0701 - 04/29 0700 ?In: 486.2 [P.O.:100; I.V.:286.2; IV Piggyback:100] ?Out: -  ?Intake/Output this shift: ?Total I/O ?In: 120 [P.O.:120] ?Out: -  ? ?Lab Results: ?Recent Labs  ?  10/11/21 ?2038 10/12/21 ?0333 10/13/21 ?5253  ?WBC 8.3 11.5* 8.7  ?HGB 10.9* 8.8* 8.8*  ?HCT 32.9* 26.2* 25.7*  ?PLT 103* 86* 84*  ? ?BMET ?Recent Labs  ?  10/11/21 ?2038 10/12/21 ?0333 10/13/21 ?6483  ?NA 133* 134* 135  ?K 3.8 3.9 3.5  ?CL 104 105 107  ?CO2 21* 23 22  ?GLUCOSE 135* 128* 92  ?BUN 30* 33* 33*  ?CREATININE 2.55* 2.53* 1.89*  ?CALCIUM 8.2* 8.0* 8.0*  ? ?LFT ?Recent Labs  ?  10/13/21 ?8930  ?PROT 6.0*  ?ALBUMIN <1.5*  ?AST 83*  ?ALT 41  ?ALKPHOS 120  ?BILITOT 3.7*  ? ?PT/INR ?Recent Labs  ?  10/11/21 ?2038 10/13/21 ?6840  ?LABPROT 15.0 14.2  ?INR 1.2 1.1  ? ?Hepatitis Panel ?No results for input(s): HEPBSAG, HCVAB, HEPAIGM, HEPBIGM in the last 72 hours. ?C-Diff ?No results for input(s): CDIFFTOX in the last 72 hours. ? ? ?Impression: Frail 71 year old lady with advanced EtOH/HCV related cirrhosis -likely has new complication of hepatocellular carcinoma.  CT diagnostic criteria cannot be met due to patient not being a candidate for contrast. ? ?No evidence of cholangitis or choledocholithiasis on cross-sectional  imaging.  No fever, elevated white count or abdominal pain. ? ?However, cross-sectional imaging findings are suspicious for adrenal metastatic disease. ? ?Recommendations: Follow through on oncology consultation.  Liver biopsy for definitive diagnosis may be technically difficult and of low yield.  Moreover, very well might not change treatment options.  Query whether or not pursuing biopsy of the adrenal lesion would have a higher yield here. ? ?Would consider stopping antibiotics at this time (ordered by EDP for concerns of cholangitis prior to GI consultation) ? ? ? ? ? ?

## 2021-10-14 DIAGNOSIS — N179 Acute kidney failure, unspecified: Secondary | ICD-10-CM | POA: Diagnosis not present

## 2021-10-14 LAB — URINE CULTURE: Culture: 50000 — AB

## 2021-10-14 LAB — BASIC METABOLIC PANEL
Anion gap: 3 — ABNORMAL LOW (ref 5–15)
BUN: 19 mg/dL (ref 8–23)
CO2: 21 mmol/L — ABNORMAL LOW (ref 22–32)
Calcium: 7.9 mg/dL — ABNORMAL LOW (ref 8.9–10.3)
Chloride: 111 mmol/L (ref 98–111)
Creatinine, Ser: 1.25 mg/dL — ABNORMAL HIGH (ref 0.44–1.00)
GFR, Estimated: 46 mL/min — ABNORMAL LOW (ref >60)
Glucose, Bld: 93 mg/dL (ref 70–99)
Potassium: 3.4 mmol/L — ABNORMAL LOW (ref 3.5–5.1)
Sodium: 135 mmol/L (ref 135–145)

## 2021-10-14 LAB — CBC WITH DIFFERENTIAL/PLATELET
Abs Immature Granulocytes: 0.12 10*3/uL — ABNORMAL HIGH (ref 0.00–0.07)
Basophils Absolute: 0 10*3/uL (ref 0.0–0.1)
Basophils Relative: 0 %
Eosinophils Absolute: 0.1 10*3/uL (ref 0.0–0.5)
Eosinophils Relative: 1 %
HCT: 26.5 % — ABNORMAL LOW (ref 36.0–46.0)
Hemoglobin: 8.3 g/dL — ABNORMAL LOW (ref 12.0–15.0)
Immature Granulocytes: 2 %
Lymphocytes Relative: 21 %
Lymphs Abs: 1.7 10*3/uL (ref 0.7–4.0)
MCH: 30.2 pg (ref 26.0–34.0)
MCHC: 31.3 g/dL (ref 30.0–36.0)
MCV: 96.4 fL (ref 80.0–100.0)
Monocytes Absolute: 1.4 10*3/uL — ABNORMAL HIGH (ref 0.1–1.0)
Monocytes Relative: 17 %
Neutro Abs: 4.9 10*3/uL (ref 1.7–7.7)
Neutrophils Relative %: 59 %
Platelets: 85 10*3/uL — ABNORMAL LOW (ref 150–400)
RBC: 2.75 MIL/uL — ABNORMAL LOW (ref 3.87–5.11)
RDW: 14.7 % (ref 11.5–15.5)
WBC: 8.2 10*3/uL (ref 4.0–10.5)
nRBC: 0 % (ref 0.0–0.2)

## 2021-10-14 MED ORDER — CEFTRIAXONE SODIUM 1 G IJ SOLR
1.0000 g | INTRAMUSCULAR | Status: DC
  Administered 2021-10-14: 1 g via INTRAVENOUS
  Filled 2021-10-14: qty 10

## 2021-10-14 NOTE — Progress Notes (Signed)
?                                                  Progress note                                           ? ?Patient: Alyssa Pena XNA:[id number] DOB: 05-Aug-1950 ?DOA: 10/11/2021 ?DOS: the patient was seen and examined on 10/14/2021 ?PCP: Alyssa Burly, MD  ?Patient coming from: Home ? ?Chief Complaint:  ?Chief Complaint  ?Patient presents with  ? Fever  ? ?HPI: Alyssa Pena is a 71 y.o. female with medical history significant of Hall dependence, alcoholic hepatitis, CHF, cirrhosis, depression, hypertension, portal venous hypertension, thrombocytopenia, and more presents ED with a chief complaint of fever.  Patient recently saw GI clinic on 25 April.  She was referred there for possible ERCP.  Patient has been experiencing intermittent epigastric pain radiating to her right upper quadrant and posteriorly for the last 1 month.  She also had nausea and vomiting.  An ultrasound was ordered on the end of March that showed hepatic cirrhosis and right hepatic cyst, thrombus in the portal vein thought to be chronic.  He also revealed a dilated common bile duct at 10 mm with cholelithiasis.  Patient also has a history of positive HCV antibody.  Today patient reports a Tmax of 104.  She did not take Tylenol or ibuprofen for the fever secondary to her cirrhosis.  She had no fever at presentation.  Patient does report right upper quadrant pain.  Is not painful at rest.  Attempting to take p.o. makes the pain worse.  Her last normal meal was the 26th around 5:30 PM.  Her last normal bowel movement is unknown.  Patient reports she has had diarrhea for several days.  She had nausea and 1 episode of emesis.  It was nonbloody.  Every time she tries to eat she feels nauseous.  Patient is not sure if she had jaundice the past couple of days or not.  Her sister is her primary historian and is not at bedside at the time of my exam.  Patient does report that she has had dysuria and thought she had a UTI.  It started last week, around  the same time that the fever started.  She has no hematuria, frequency, or urgency.  She was not started on an antibiotic in the outpatient oral.  Patient has no other complaints at this time. ? ?Patient does not smoke, does not drink alcohol-she quit 2 years ago.  She used to use marijuana and cocaine in her youth, but has not in the last several decades.  She did get her COVID shot.  Patient is DNR. ? ?Subjective ?Continues to feel better since arrival ? ?Physical Exam: ?Vitals:  ? 10/13/21 0906 10/13/21 1315 10/13/21 2251 10/14/21 0453  ?BP: (!) 103/43 109/62 (!) 123/53 113/63  ?Pulse: 62 65 66 66  ?Resp:  _0 ?Temp:  99.2 ?F (37.3 ?C) 98.1 ?F (36.7 ?C) 98.8 ?F (37.1 ?C)  ?TempSrc:  Oral Oral   ?SpO2:  97% 97% 97%  ?Weight:      ?Height:      ? ?1.  General: ?Patient lying supine in  bed, chronically ill-appearing ? ?2. Psychiatric: ?Alert and oriented x 3, mood and behavior normal for situation, pleasant and cooperative with exam ?  ?3. Neurologic: ?Speech and language are normal, face is symmetric, moves all 4 extremities voluntarily, at baseline without acute deficits on limited exam ?  ?4. HEENMT:  ?Head is atraumatic, normocephalic, pupils reactive to light, neck is supple, trachea is midline, mucous membranes are moist, hoarse voice ?  ?5. Respiratory : ?Lungs are clear to auscultation bilaterally without wheezing, rhonchi, rales, no cyanosis, no increase in work of breathing or accessory muscle use ?  ?6. Cardiovascular : ?Heart rate normal, rhythm is regular, no murmurs, rubs or gallops, no peripheral edema, peripheral pulses palpated ?  ?7. Gastrointestinal:  ?Abdomen is soft, nondistended, tender to palpation in the right upper quadrant, bowel sounds active, no masses or organomegaly palpated ?  ?8. Skin:  ?Skin is warm, dry and intact without rashes, it is dull and jaundiced ?  ?9.Musculoskeletal:  ?No acute deformities or trauma, no asymmetry in tone, no peripheral edema, peripheral pulses  palpated, no tenderness to palpation in the extremities ? ?Data Reviewed: ?In the ED ?Temp 97.8, heart rate 60-77, respiratory rate 16-25, blood pressure 97/52-116/75, satting 92-97% on room air ?No leukocytosis, thrombocytopenia 103 ?Chemistry reveals a bump in BUN at 30, bump in creatinine 2.55 ?Alk phos 147, AST 109, ALT 51 ?Albumin 1.7 ?T. bili 4.1 ?CT abdomen pelvis shows no bowel obstruction.  Cirrhosis.  Suspected cavernous transformation of portal vein with abdominal varices.  No ascites.  3.0 right adrenal nodule that could be metastasis and should be followed up with CT in 4 to 6 weeks.  No CBD ductal dilatation. ?Alcohol level less than 10 ?GI consulted from the ED and recommends starting ertapenem admit to [facility], they will see in the a.m. ?Assessment and Plan: ?* AKI (acute kidney injury) ([place]) ?-AKI with creatinine baseline 1.0, creatinine on admission 2.5 down to 1.25 ?-Avoid nephrotoxic agents when possible ?-Continue gentle IV hydration ?-Continue to monitor ?-Continues to improve ? ?Hypoalbuminemia ?-Albumin 1.7 ?-Secondary to cirrhosis and poor p.o. intake ?-Encourage nutrient dense food choices when patient is able to take p.o. ? ?Elevated liver function tests ?Alk phos 147, AST 109 up from 91, ALT 51 up from 42, T. bili 4.1 ?Ductal dilatation of CBD had been seen recently, but on today's CT there is no ductal dilatation ?Concern for cholangitis given fever, jaundice, right upper quadrant pain-and this could be contributing to the elevated liver function tests ?Stop antibiotics today  ?Appreciate GI input  ? ?Rule out cholangitis ?- Fever of 104 at home, right upper quadrant pain, T. bili 4.1 ?-Patient was told by GI office to present to the ED if she develops a fever ?-GI consulted from the ED and reported to start her on ertapenem (penicillin allergy) and they will follow ?-Stop ertapenem today ?-MRCP cirrhosis stigmata portal vein thrombus changes concerning for underlying hepatocellular  carcinoma 1.8 cm enhancing nodule in the right adrenal gland ?-Continue to monitor ? ?Benign essential HTN ?Continue Coreg ?Holding ACE in the setting of AKI ? ?Cirrhosis ([place]) ?-Secondary to history of alcohol dependence ?-Quit drinking 2 years ago ?-Holding home Lasix secondary to AKI ?-Continue to monitor ? ?Concern for hepatocellular carcinoma ?-Elevated AFP ?-GI consulted for further work-up ?-High suspicion for hepatocellular carcinoma updated patient of this at the bedside ?-We will consult oncology for full evaluation tomorrow to evaluate next steps and definitive diagnosis ? ?E. coli UTI ?-Change ertapenem to Rocephin ? ?  Advance Care Planning:   Code Status: DNR  ? ?Consults: GI ? ?Author: ?Phillips Grout, MD ?10/14/2021 9:22 AM ? ?

## 2021-10-14 NOTE — Plan of Care (Signed)

## 2021-10-15 ENCOUNTER — Inpatient Hospital Stay (HOSPITAL_COMMUNITY): Payer: Medicare Other

## 2021-10-15 DIAGNOSIS — N179 Acute kidney failure, unspecified: Secondary | ICD-10-CM | POA: Diagnosis not present

## 2021-10-15 DIAGNOSIS — K7030 Alcoholic cirrhosis of liver without ascites: Secondary | ICD-10-CM

## 2021-10-15 LAB — COMPREHENSIVE METABOLIC PANEL
ALT: 43 U/L (ref 0–44)
AST: 95 U/L — ABNORMAL HIGH (ref 15–41)
Albumin: <1.5 g/dL — ABNORMAL LOW (ref 3.5–5.0)
Alkaline Phosphatase: 139 U/L — ABNORMAL HIGH (ref 38–126)
Anion gap: 3 — ABNORMAL LOW (ref 5–15)
BUN: 14 mg/dL (ref 8–23)
CO2: 21 mmol/L — ABNORMAL LOW (ref 22–32)
Calcium: 8.1 mg/dL — ABNORMAL LOW (ref 8.9–10.3)
Chloride: 112 mmol/L — ABNORMAL HIGH (ref 98–111)
Creatinine, Ser: 0.93 mg/dL (ref 0.44–1.00)
GFR, Estimated: >60 mL/min (ref >60)
Glucose, Bld: 96 mg/dL (ref 70–99)
Potassium: 3.8 mmol/L (ref 3.5–5.1)
Sodium: 136 mmol/L (ref 135–145)
Total Bilirubin: 5.2 mg/dL — ABNORMAL HIGH (ref 0.3–1.2)
Total Protein: 6.8 g/dL (ref 6.5–8.1)

## 2021-10-15 LAB — PROTIME-INR
INR: 1.3 — ABNORMAL HIGH (ref 0.8–1.2)
Prothrombin Time: 16 seconds — ABNORMAL HIGH (ref 11.4–15.2)

## 2021-10-15 IMAGING — CT CT ABD-PEL WO/W CM
2 of 12 series · 11 of 46 positions shown, 15 images · IV contrast (Omnipaque or Isovue)
Comparison: MRI abdomen [DATE]

CLINICAL DATA: Cirrhosis and portal vein thrombosis. Evaluate for
possible HCC.

EXAM:
CT ABDOMEN AND PELVIS WITHOUT AND WITH CONTRAST
TECHNIQUE: Multidetector CT imaging of the abdomen and pelvis was performed
following the standard protocol before and following the bolus
administration of intravenous contrast.

[Series 9: coronal arterial · coronal · arterial · 0.47mm/px · 2 of 90 slices shown]
[im 30/90  soft-tissue]
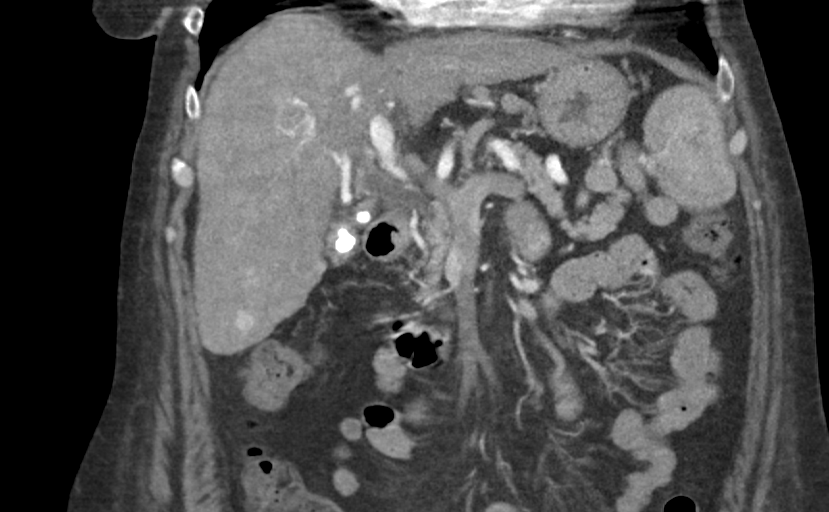
[im 60/90  soft-tissue]
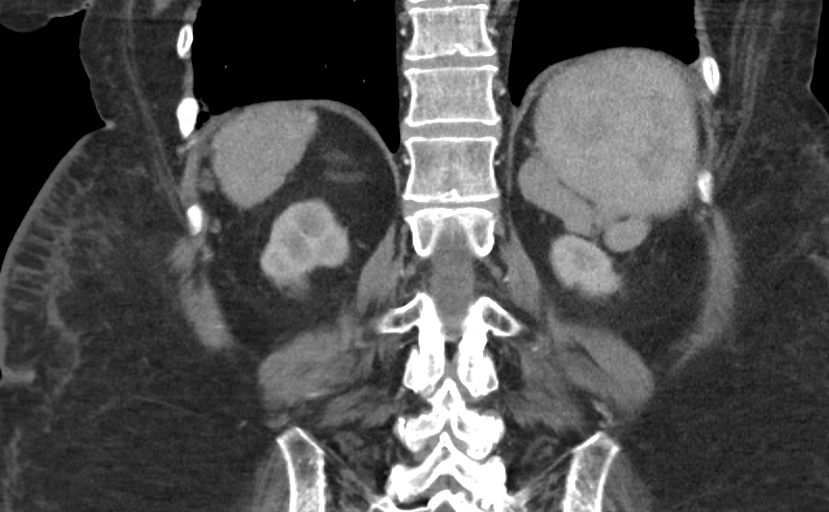

[Series 11: axial venous · axial · portal-venous · 0.90mm/px · z∈[-326,+25]mm · 9 of 143 slices shown, 13 images]
[im 13/143  soft-tissue]
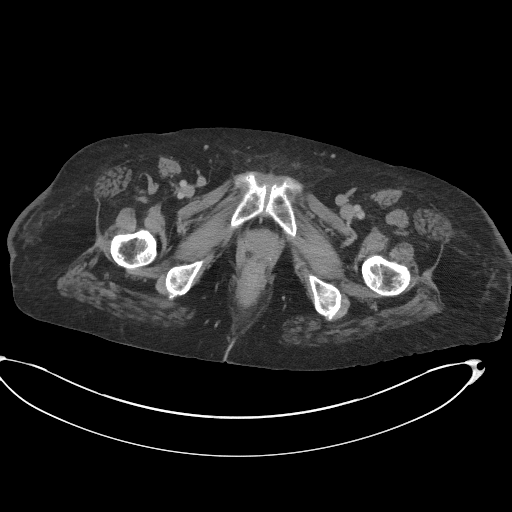
[im 13/143  bone]
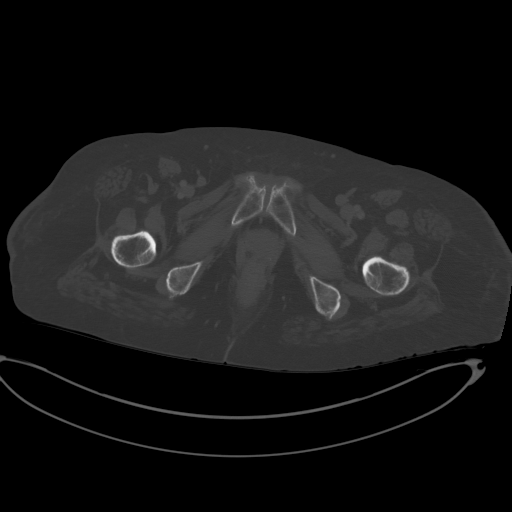
[im 26/143  soft-tissue]
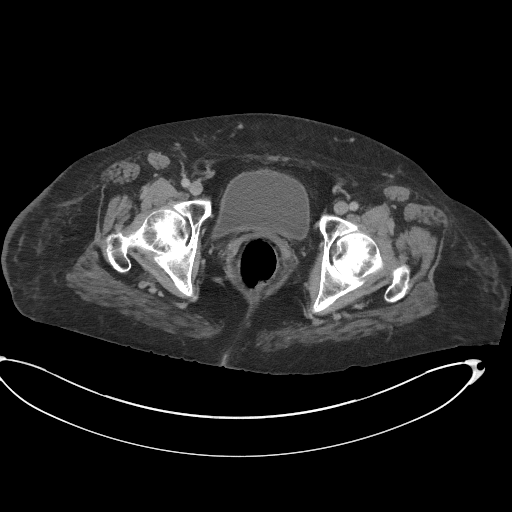
[im 52/143  soft-tissue]
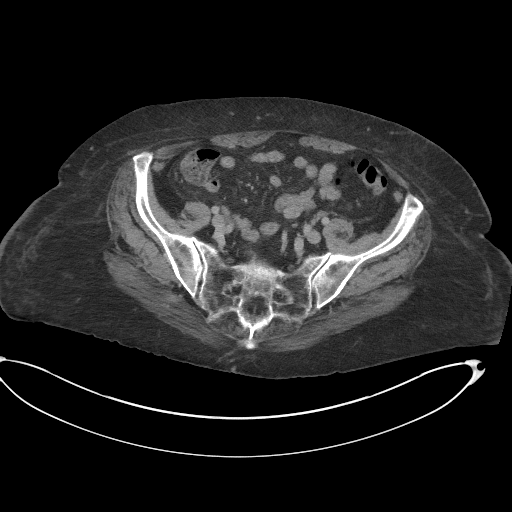
[im 65/143  soft-tissue]
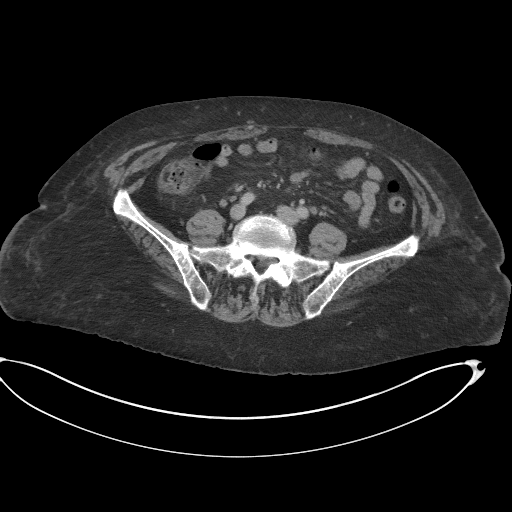
[im 78/143  soft-tissue]
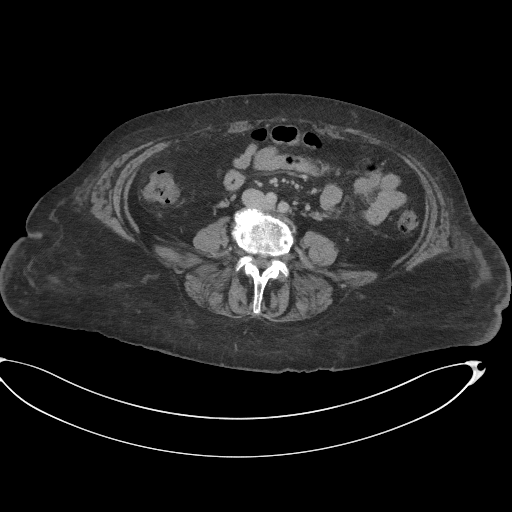
[im 91/143  soft-tissue]
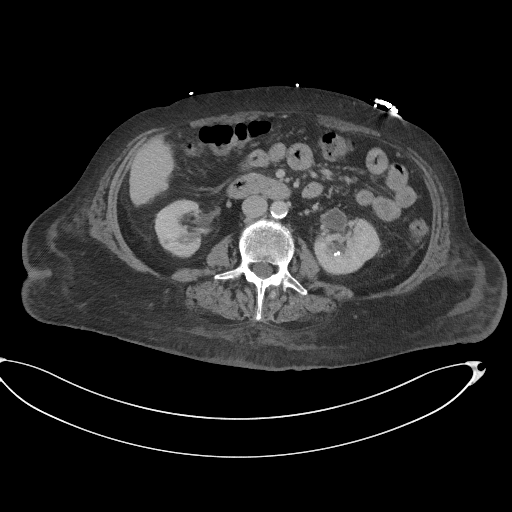
[im 91/143  lung]
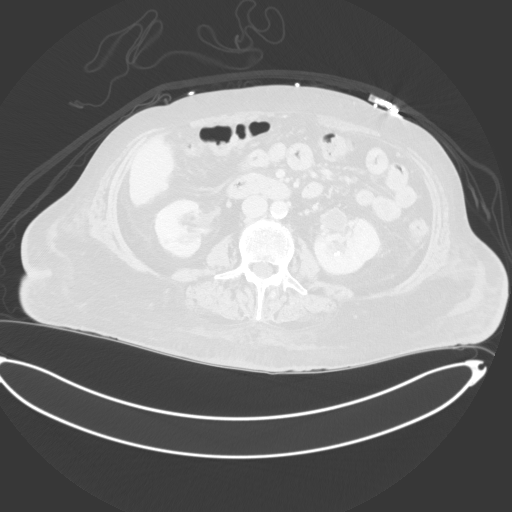
[im 104/143  lung]
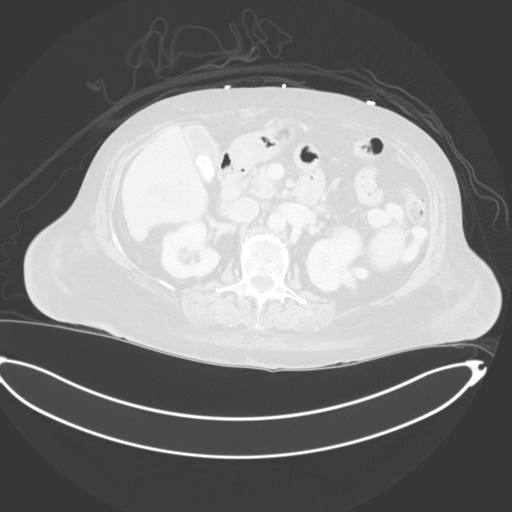
[im 117/143  soft-tissue]
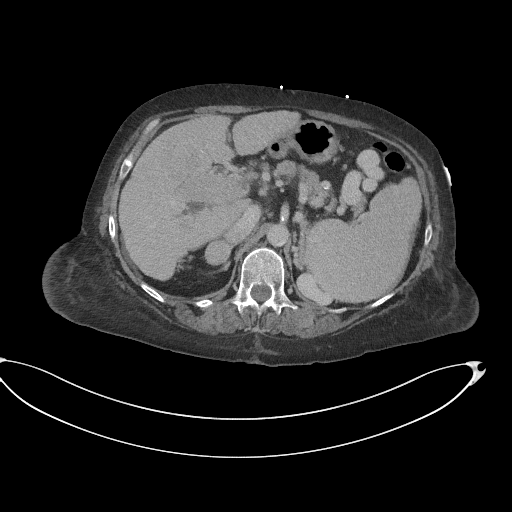
[im 117/143  lung]
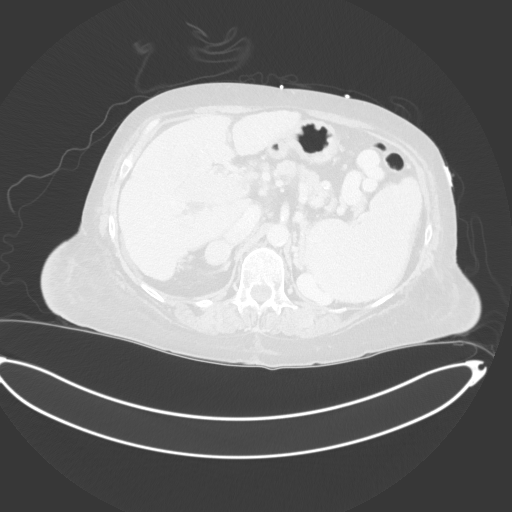
[im 130/143  soft-tissue]
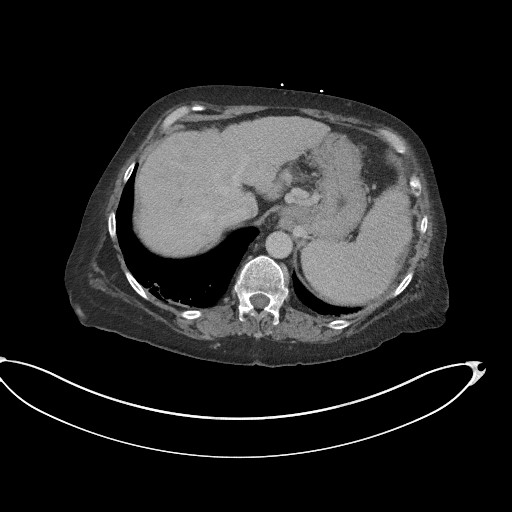
[im 130/143  lung]
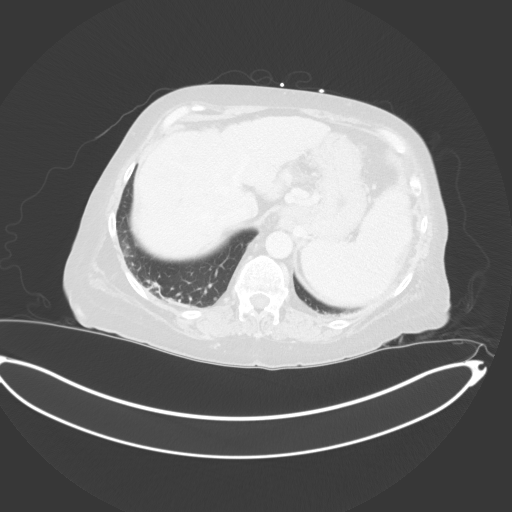

[11 of 46 positions shown; findings below may reference images not displayed]

RADIATION DOSE REDUCTION: This exam was performed according to the
departmental dose-optimization program which includes automated
exposure control, adjustment of the mA and/or kV according to
patient size and/or use of iterative reconstruction technique.

CONTRAST:  100mL OMNIPAQUE IOHEXOL 300 MG/ML  SOLN
FINDINGS: Lower chest: Subpleural atelectasis at the right lung base but no
worrisome pulmonary lesions. The heart is within normal limits in
size. No pericardial effusion.

Hepatobiliary: Advanced cirrhotic changes involving the liver with
portal venous hypertension, portal venous collaterals and
splenomegaly. Extensive main portal vein thrombosis. Reconstitution
of the left portal vein with intrahepatic blood flow. The middle and
right portal veins are completely occluded. Diffuse and fairly
marked enhancement of the thrombus and probable surrounding
infiltrating tumor. There also numerous arterial phase enhancing
lesions suggesting small HCC's or dysplastic nodules. Because of the
thrombosed portal vein there is a very bizarre enhancement pattern
in the liver. In segment 6 there is enhancement of the distended
portal vein but also some enhancement and subsequent low-attenuation
in the surrounding parenchyma. Findings likely infiltrative HCC.
This liver is considered CLAYAN.

Pancreas: No mass, inflammation or ductal dilatation.

Spleen: Splenomegaly with extensive perisplenic collaterals but no
splenic lesions.

Adrenals/Urinary Tract: 2 cm right adrenal gland lesion, not present
in [B7] and demonstrating high attenuation, contrast enhancement and
no significant washout suggesting a metastatic focus. The left
adrenal gland is difficult to identify because of the surrounding
collateral vessels but no obvious lesion. Bilateral renal calculi
but no worrisome renal lesions.

Stomach/Bowel: Stomach, duodenum, small bowel and colon are grossly
normal.

Vascular/Lymphatic: Atherosclerotic calcifications involving the
aorta but no aneurysm dissection. No mesenteric or retroperitoneal
mass or adenopathy.

Reproductive: The uterus is surgically absent. Both ovaries are
still present and appear normal.

Other: Small amount of free pelvic fluid but no overt ascites.

Musculoskeletal: No significant bony findings.
IMPRESSION: 1. Advanced cirrhotic changes involving the liver with portal venous
hypertension, portal venous collaterals and splenomegaly.
2. Extensive main portal vein thrombosis extending into the middle
and right portal veins which are markedly distended and demonstrate
enhancement. Findings consistent with tumor thrombus. The left
portal vein is reconstituted.
3. Numerous arterial phase enhancing lesions suggesting small HCC's
or dysplastic nodules.
4. In segment 6 there is enhancement of the distended right portal
vein but also some enhancement and subsequent low-attenuation in the
surrounding parenchyma. Findings likely infiltrative HCC. This liver
is considered CLAYAN (diffuse tumor thrombus).
5. 2 cm right adrenal gland lesion worrisome for metastatic disease.

Aortic Atherosclerosis ([B7]-[B7]).

## 2021-10-15 MED ORDER — ALBUMIN HUMAN 25 % IV SOLN
25.0000 g | Freq: Four times a day (QID) | INTRAVENOUS | Status: AC
  Administered 2021-10-15 – 2021-10-16 (×4): 25 g via INTRAVENOUS
  Filled 2021-10-15 (×4): qty 100

## 2021-10-15 MED ORDER — CEFAZOLIN SODIUM-DEXTROSE 1-4 GM/50ML-% IV SOLN
1.0000 g | Freq: Three times a day (TID) | INTRAVENOUS | Status: DC
  Administered 2021-10-15 – 2021-10-16 (×2): 1 g via INTRAVENOUS
  Filled 2021-10-15 (×6): qty 50

## 2021-10-15 MED ORDER — IOHEXOL 300 MG/ML  SOLN
100.0000 mL | Freq: Once | INTRAMUSCULAR | Status: AC | PRN
  Administered 2021-10-15: 100 mL via INTRAVENOUS

## 2021-10-15 NOTE — Progress Notes (Addendum)
? ?Gastroenterology Progress Note  ? ?Referring Provider: No ref. provider found ?Primary Care Physician:  Neale Burly, MD ?Primary Gastroenterologist:  Dr. Laural Golden ? ?Patient ID: Alyssa Pena; [id number]; 09/25/1950  ? ? ?Subjective  ? ?Pain improved. Nauseated yesterday. Didn't like breakfast options so didn't eat.  ? ? ?Objective  ? ?Vital signs in last 24 hours ?Temp:  [97.8 ?F (36.6 ?C)-98.8 ?F (37.1 ?C)] 97.8 ?F (36.6 ?C) (04/30 2025) ?Pulse Rate:  [59-62] 59 (04/30 2025) ?Resp:  [16-17] 17 (04/30 2025) ?BP: (113-135)/(60-69) 113/60 (04/30 2025) ?SpO2:  [97 %] 97 % (04/30 2025) ?Last BM Date : 10/14/21 ? ?Physical Exam ?General:   Alert and oriented, pleasant ?Head:  Normocephalic and atraumatic. ?Eyes:  No icterus, sclera clear. Conjuctiva pink.  ?Abdomen:  Bowel sounds present, soft, non-tender, non-distended. No HSM or hernias noted. No rebound or guarding. No masses appreciated  ?Neurologic:  Alert and  oriented x4 ? ?Intake/Output from previous day: ?04/30 0701 - 05/01 0700 ?In: 680 [P.O.:680] ?Out: 800 [Urine:800] ?Intake/Output this shift: ?No intake/output data recorded. ? ?Lab Results ? ?Recent Labs  ?  10/13/21 ?6160 10/14/21 ?7371  ?WBC 8.7 8.2  ?HGB 8.8* 8.3*  ?HCT 25.7* 26.5*  ?PLT 84* 85*  ? ?BMET ?Recent Labs  ?  10/13/21 ?0626 10/14/21 ?9485  ?NA 135 135  ?K 3.5 3.4*  ?CL 107 111  ?CO2 22 21*  ?GLUCOSE 92 93  ?BUN 33* 19  ?CREATININE 1.89* 1.25*  ?CALCIUM 8.0* 7.9*  ? ?LFT ?Recent Labs  ?  10/13/21 ?4627  ?PROT 6.0*  ?ALBUMIN <1.5*  ?AST 83*  ?ALT 41  ?ALKPHOS 120  ?BILITOT 3.7*  ? ?PT/INR ?Recent Labs  ?  10/13/21 ?0350  ?LABPROT 14.2  ?INR 1.1  ? ? ?Studies/Results ?CT Abdomen Pelvis Wo Contrast ? ?Result Date: 10/11/2021 ?CLINICAL DATA:  Abdominal pain, vomiting, fever.  Cirrhosis. EXAM: CT ABDOMEN AND PELVIS WITHOUT CONTRAST TECHNIQUE: Multidetector CT imaging of the abdomen and pelvis was performed following the standard protocol without IV contrast. RADIATION DOSE REDUCTION: This exam  was performed according to the departmental dose-optimization program which includes automated exposure control, adjustment of the mA and/or kV according to patient size and/or use of iterative reconstruction technique. COMPARISON:  12/24/2018 FINDINGS: Lower chest: Subpleural scarring at the lung bases. Hepatobiliary: Cirrhosis.  No focal hepatic lesion on unenhanced CT. Layering gallstones (series 2/image 25), without associated inflammatory changes. No intrahepatic or extrahepatic ductal dilatation. Pancreas: Within normal limits. Spleen: At the upper limits of normal for size. Adrenals/Urinary Tract: 2.3 x 2.1 x 3.0 cm right adrenal nodule (series 2/image 18), new from 2020. Left adrenal gland is not well visualized but grossly unremarkable. 11 mm anterior right upper pole renal cyst (series 2/image 22). Cortical scarring in the right kidney, likely related to prior intervention. Bilateral nonobstructing renal calculi, measuring up to 10 mm in the left lower pole (series 2/image 42). Urothelial thickening along the left renal collecting system (series 2/image 36). No hydronephrosis. Bladder is mildly thick-walled although underdistended. Stomach/Bowel: Stomach is within normal limits. No evidence of bowel obstruction. Normal appendix (series 2/image 63). No colonic wall thickening, inflammatory changes, or mass is evident on CT. Vascular/Lymphatic: No evidence of abdominal aortic aneurysm. Atherosclerotic calcifications of the abdominal aorta and branch vessels. Cavernous transformation of the portal vein is suspected on unenhanced CT (series 2/image 21). Perigastric varices. Splenorenal shunt with extensive varices in the left mid abdomen (series 2/image 25). No suspicious abdominopelvic lymphadenopathy. Reproductive: Status post hysterectomy. Bilateral ovaries  are within normal limits. Other: No abdominopelvic ascites. Musculoskeletal: Healing right posterior 10th rib fracture. Mild degenerative changes of the  visualized thoracolumbar spine. IMPRESSION: No evidence of bowel obstruction. Normal appendix. No colonic wall thickening, inflammatory changes, or mass is evident on CT. Cirrhosis. 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 Spleen is normal in size. No abdominopelvic ascites. 3.0 right adrenal nodule, new from 2020. If the patient has a known malignancy, metastatic disease is certainly possible. Regardless, consider follow-up adrenal protocol CT in 4-6 weeks for additional characterization. Cholelithiasis, without associated inflammatory changes. Bilateral nonobstructing renal calculi, measuring up to 10 mm in the left lower pole. No hydronephrosis. Electronically Signed   By: Julian Hy M.D.   On: 10/11/2021 22:16  ? ?MR 3D Recon At Scanner ? ?Result Date: 10/12/2021 ?CLINICAL DATA:  Jaundice EXAM: MRI ABDOMEN WITHOUT AND WITH CONTRAST (INCLUDING MRCP) TECHNIQUE: Multiplanar multisequence MR imaging of the abdomen was performed both before and after the administration of intravenous contrast. Heavily T2-weighted images of the biliary and pancreatic ducts were obtained, and three-dimensional MRCP images were rendered by post processing. CONTRAST:  7.21m GADAVIST GADOBUTROL 1 MMOL/ML IV SOLN COMPARISON:  CT 10/11/2021 FINDINGS: Lower chest: No acute findings. Hepatobiliary: Morphologic features of the liver compatible with cirrhosis including caudate lobe hypertrophy and nodular contour. The postoperative images are suboptimal due to motion artifact and delayed timing of the contrast bolus. Within this limitation no obvious focal enhancing liver lesions are identified. However, there is complete, expansile thrombus involving the middle and right intrahepatic branches of the portal vein. Due to the limitations described above it is difficult to visually determine whether this is enhancing or bland thrombus. Measurements of the increase intensity units within the  portal veins suggest enhancement however. Furthermore given the elevation in AFP underlying hepatocellular carcinoma with tumor thrombus in the portal vein is suspected. Multiple stones are identified within the gallbladder which measure up to 9 mm. The common bile duct is mildly increased in caliber measuring up to 7 mm. No choledocholithiasis identified. Pancreas: No mass, inflammatory changes, or other parenchymal abnormality identified. Spleen: The spleen is upper limits of normal measuring 12.5 by 12.4 x 4.9 cm (volume = 400 cm^3). No focal splenic abnormality. Adrenals/Urinary Tract: There is an enhancing nodule within the right adrenal gland which is new from 12/24/2018. This measures 1.8 cm, image 32/8. This is a new finding compared with 12/24/2018 and within the limitations of motion artifact does not meet MR criteria for adenopathy. Left adrenal gland is not confidently identified which may reflect presence of the large left upper quadrant varicosities. Bilateral simple appearing kidney cysts are identified. No follow-up recommended. No hydronephrosis identified bilaterally. Stomach/Bowel: Visualized portions within the abdomen are unremarkable. Vascular/Lymphatic: Normal appearance of the abdominal aorta. No aneurysm. Thrombosis of the middle and right branches of the portal vein identified with tumor thrombus extending into the extrahepatic portal vein. No thrombus identified within the portal venous confluence or SMV. Large left upper quadrant varicosities. No adenopathy identified within the upper abdomen. Other:  No focal fluid collections or free fluid identified. Musculoskeletal: No suspicious bone lesions identified. IMPRESSION: 1. Morphologic features of liver compatible with cirrhosis. Stigmata of portal venous hypertension identified including splenomegaly and large left upper quadrant varicosities. 2. Expansile thrombus involving the middle and right intrahepatic branches of the portal vein.  Despite marked motion artifact and suboptimal bolus timing of the contrast administration there is measurable enhancement within the portal vein thrombus, which  is concerning for tumor thrombus likely secon

## 2021-10-15 NOTE — Evaluation (Addendum)
Physical Therapy Evaluation ?Patient Details ?Name: Alyssa Pena ?MRN: [id number] ?DOB: 1951/02/12 ?Today's Date: 10/15/2021 ? ?History of Present Illness ? Alyssa Pena is a 71 y.o. female with medical history significant of Hall dependence, alcoholic hepatitis, CHF, cirrhosis, depression, hypertension, portal venous hypertension, thrombocytopenia, and more presents ED with a chief complaint of fever.  Patient recently saw GI clinic on 25 April.  She was referred there for possible ERCP.  Patient has been experiencing intermittent epigastric pain radiating to her right upper quadrant and posteriorly for the last 1 month.  She also had nausea and vomiting.  An ultrasound was ordered on the end of March that showed hepatic cirrhosis and right hepatic cyst, thrombus in the portal vein thought to be chronic.  He also revealed a dilated common bile duct at 10 mm with cholelithiasis.  Patient also has a history of positive HCV antibody.  Today patient reports a Tmax of 104.  She did not take Tylenol or ibuprofen for the fever secondary to her cirrhosis.  She had no fever at presentation.  Patient does report right upper quadrant pain.  Is not painful at rest.  Attempting to take p.o. makes the pain worse.  Her last normal meal was the 26th around 5:30 PM.  Her last normal bowel movement is unknown.  Patient reports she has had diarrhea for several days.  She had nausea and 1 episode of emesis.  It was nonbloody.  Every time she tries to eat she feels nauseous.  Patient is not sure if she had jaundice the past couple of days or not.  Her sister is her primary historian and is not at bedside at the time of my exam.  Patient does report that she has had dysuria and thought she had a UTI.  It started last week, around the same time that the fever started.  She has no hematuria, frequency, or urgency.  She was not started on an antibiotic in the outpatient oral.  Patient has no other complaints at this time. ?  ?Clinical  Impression ? Patient demonstrates slow labored movement for sitting up at bedside and with performing transfers with sit to stand and bed to chair pivot transfers. Patient demonstrates generalized weakness and decreased in balancing ability that impacts patient's ability to ambulate in the community and perform ADL's independently and safely. Patient is primarily limited by fatigue and SOB as demonstrated during evaluation. Patient will benefit from continued skilled physical therapy in hospital and recommended venue below to increase strength, balance, endurance for safe ADLs and gait.  ?   ? ?Recommendations for follow up therapy are one component of a multi-disciplinary discharge planning process, led by the attending physician.  Recommendations may be updated based on patient status, additional functional criteria and insurance authorization. ? ?Follow Up Recommendations Home health PT ? ?  ?Assistance Recommended at Discharge Set up Supervision/Assistance  ?Patient can return home with the following ? A little help with walking and/or transfers;A little help with bathing/dressing/bathroom;Assistance with cooking/housework ? ?  ?Equipment Recommendations Kasandra Knudsen  ?Recommendations for Other Services ?    ?  ?Functional Status Assessment Patient has had a recent decline in their functional status and demonstrates the ability to make significant improvements in function in a reasonable and predictable amount of time.  ? ?  ?Precautions / Restrictions Precautions ?Precautions: Fall ?Restrictions ?Weight Bearing Restrictions: No  ? ?  ? ?Mobility ? Bed Mobility ?Overal bed mobility: Modified Independent ?Bed Mobility: Supine to Sit ?  ?  ?  Supine to sit: Modified independent (Device/Increase time), Min guard ?  ?  ?General bed mobility comments: Patient able to perform supine to sit but demosntrates decreased trunk control with extremities reaching outside base of support while sitting at EOB. Patient is overall  indepdendent with bed mobility with increase in time noted to complete task. ?  ? ?Transfers ?Overall transfer level: Needs assistance ?Equipment used: Quad cane ?Transfers: Sit to/from Stand, Bed to chair/wheelchair/BSC ?Sit to Stand: Min assist, Modified independent (Device/Increase time), Min guard ?  ?Step pivot transfers: Min assist, Modified independent (Device/Increase time) ?  ?  ?  ?General transfer comment: Patient was mostly independent with transfer with min assist needed along with gait belt utilized. Patient is contact guard due to slight unsteadiness with weight shifting and balancing during transfers. ?  ? ?Ambulation/Gait ?Ambulation/Gait assistance: Modified independent (Device/Increase time), Min guard, Min assist ?Gait Distance (Feet): 45 Feet ?Assistive device: Quad cane ?Gait Pattern/deviations: Decreased step length - right, Decreased step length - left, Decreased stride length, Narrow base of support, Knees buckling ?Gait velocity: decreased ?  ?  ?General Gait Details: Patient was able to ambulate halfway with min assist but fatigue and generalized LE weakness became limiting factors in which the patient needed frequent breaks along with increased assistance during assessment. Patient vitals assessed after gait assessment with SOB noted, but 98% sP02 was recorded noting no drastic change in vitals during assessment ? ?Stairs ?  ?  ?  ?  ?  ? ?Wheelchair Mobility ?  ? ?Modified Rankin (Stroke Patients Only) ?  ? ?  ? ?Balance Overall balance assessment: Needs assistance ?Sitting-balance support: Feet supported ?  ?Sitting balance - Comments: Patient demonstrates balance deficits when reaching outside of base of support while at EOB. ?  ?Standing balance support: Single extremity supported, During functional activity, Reliant on assistive device for balance ?  ?  ?  ?  ?  ?  ?  ?  ?  ?  ?  ?  ?  ?   ? ? ? ?Pertinent Vitals/Pain Pain Assessment ?Pain Assessment: 0-10 ?Pain Score: 0-No  pain ?Pain Intervention(s): Monitored during session, Repositioned  ? ? ?Home Living Family/patient expects to be discharged to:: Private residence ?Living Arrangements: Alone ?Available Help at Discharge: Family;Neighbor ?Type of Home: House ?Home Access: Stairs to enter ?Entrance Stairs-Rails: Left;Right ?Entrance Stairs-Number of Steps: 8-9 ?  ?Home Layout: One level ?Home Equipment: Kasandra Knudsen - single point ?Additional Comments: Straight cane prn but rarely used  ?  ?Prior Function Prior Level of Function : Needs assist ?  ?  ?  ?Physical Assist : ADLs (physical) ?  ?ADLs (physical): IADLs ?Mobility Comments: Patient reports she mainly ambulates in the household with not ambulating in the community often. Patient is currently driving. ?ADLs Comments: Patient reports needing min to moderate assistance with household functions and ADL's. Notes neighbor helps often in these aspects ?  ? ? ?Hand Dominance  ?   ? ?  ?Extremity/Trunk Assessment  ?   ?  ? ?Lower Extremity Assessment ?Lower Extremity Assessment: Generalized weakness;Overall [facility] for tasks assessed ?  ? ?   ?Communication  ? Communication: No difficulties  ?Cognition Arousal/Alertness: Awake/alert ?Behavior During Therapy: [facility] for tasks assessed/performed ?Overall Cognitive Status: Within Functional Limits for tasks assessed ?  ?  ?  ?  ?  ?  ?  ?  ?  ?  ?  ?  ?  ?  ?  ?  ?  ?  ?  ? ?  ?  General Comments   ? ?  ?Exercises    ? ?Assessment/Plan  ?  ?PT Assessment Patient needs continued PT services  ?PT Problem List Decreased strength;Decreased activity tolerance;Decreased balance;Decreased mobility;Decreased coordination ? ?   ?  ?PT Treatment Interventions DME instruction;Gait training;Stair training;Functional mobility training;Therapeutic activities;Therapeutic exercise;Balance training   ? ?PT Goals (Current goals can be found in the Care Plan section)  ?Acute Rehab PT Goals ?Patient Stated Goal: return home ?PT Goal Formulation: With patient ?Time For Goal  Achievement: 10/29/21 ?Potential to Achieve Goals: Good ? ?  ?Frequency Min 3X/week ?  ? ? ?Co-evaluation   ?  ?  ?  ?  ? ? ?  ?AM-PAC PT "6 Clicks" Mobility  ?Outcome Measure Help needed turning from your back to your side

## 2021-10-15 NOTE — Progress Notes (Signed)
?                                                  Progress note                                           ? ?Patient: Alyssa Pena EVO:[id number] DOB: 1950-08-25 ?DOA: 10/11/2021 ?DOS: the patient was seen and examined on 10/15/2021 ?PCP: Alyssa Burly, MD  ?Patient coming from: Home ? ?Chief Complaint: Fever  ? ?Subjective: ? ?Patient was seen and examined this morning, remained stable no acute distress. ? ?------------------------------------------------------------------------------------------------------------ ?Per HPI: Alyssa Pena is a 71 y.o. female with medical history significant of Hall dependence, alcoholic hepatitis, CHF, cirrhosis, depression, hypertension, portal venous hypertension, thrombocytopenia, and more presents ED with a chief complaint of fever.  Patient recently saw GI clinic on 25 April.  She was referred there for possible ERCP.  Patient has been experiencing intermittent epigastric pain radiating to her right upper quadrant and posteriorly for the last 1 month.  She also had nausea and vomiting.  An ultrasound was ordered on the end of March that showed hepatic cirrhosis and right hepatic cyst, thrombus in the portal vein thought to be chronic.  Also revealed a dilated common bile duct at 10 mm with cholelithiasis.   ?Patient also has a history of positive HCV antibody.  Today patient reports a Tmax of 104.  She did not take Tylenol or ibuprofen for the fever secondary to her cirrhosis.  She had no fever at presentation.  Patient does report right upper quadrant pain.  Is not painful at rest.  Attempting to take p.o. makes the pain worse ?Patient does report that she has had dysuria and thought she had a UTI.  It started last week, around the same time that the fever started.   ? ?She did get her COVID shot.  Patient is DNR. ? ?-------------------------------------------------------------------------------------------------------------- ?Data Reviewed: ?In the ED ?Temp 97.8, heart rate  60-77, respiratory rate 16-25, blood pressure 97/52-116/75, satting 92-97% on room air ?No leukocytosis, thrombocytopenia 103 ?Chemistry reveals a bump in BUN at 30, bump in creatinine 2.55 ?Alk phos 147, AST 109, ALT 51 ?Albumin 1.7 ?T. bili 4.1 ?CT abdomen pelvis shows no bowel obstruction.  Cirrhosis.  Suspected cavernous transformation of portal vein with abdominal varices.  No ascites.  3.0 right adrenal nodule that could be metastasis and should be followed up with CT in 4 to 6 weeks.  No CBD ductal dilatation. ?Alcohol level less than 10 ?GI consulted from the ED and recommends starting ertapenem admit to [facility]. ? ? ?Assessment and Plan: ? ?* AKI (acute kidney injury) ([place]) ?-AKI with creatinine baseline 1.0,  ?- Creatinine on admission 2.5 >> 1.89, 1.25, 0.93 today ?-Avoid nephrotoxic agents when possible ?-IVF  ?-Continue to monitor ? ?Hypoalbuminemia ?-Albumin  ?-Secondary to cirrhosis and poor p.o. intake ?-Encourage nutrient dense food choices when patient is able to take p.o. ? ?Elevated liver function tests ? ?  Latest Ref Rng & Units 10/15/2021  ? 11:16 AM 10/13/2021  ?  5:21 AM 10/12/2021  ?  3:33 AM  ?Hepatic Function  ?Total Protein 6.5 - 8.1 g/dL 6.8   6.0   5.9    ?Albumin  3.5 - 5.0 g/dL <1.5   <1.5   <1.5    ?AST 15 - 41 U/L 95   83   89    ?ALT 0 - 44 U/L 43   41   42    ?Alk Phosphatase 38 - 126 U/L 139   120   122    ?Total Bilirubin 0.3 - 1.2 mg/dL 5.2   3.7   3.5    ? ? ?Ductal dilatation of CBD had been seen recently, but on today's CT there is no ductal dilatation ?Concern for Cholangitis given Fever, Jaundice, right upper quadrant pain-and this could be contributing to the elevated liver function tests ?Continue treatment under cholangitis ? ? ?Cholangitis -concern for hepatocellular carcinoma ?-Proved symptoms, worsening AST, alk phos, elevated T-Bili ?- Fever Tmax. 104 at home, right upper quadrant pain, T. bili 4.1 ?-Patient was told by GI office to present to the ED if she develops a  fever ?-GI consulted from the ED and reported to start her on Ertapenem (penicillin allergy) and they will follow ?-Continue ertapenem ?-MRCP -was not done by GI ?-GI concern for hepatocellular carcinoma... Consulted oncologist Alyssa Pena ?-Appreciate input from neurology Alyssa Pena ?- ? ?Benign essential HTN ?Continue Coreg ?Holding ACE in the setting of AKI ? ?Cirrhosis ([place]) ?-Secondary to history of alcohol dependence, hepatitis ?HCV RNA, 246 K,  ?HIV nonreactive ?-Quit drinking 2 years ago ?-Holding home Lasix secondary to AKI ?-Continue to monitor ? ?UTI : ?Urine E. coli, pansensitive ?-On Rocephin IV ? ?Concern for hepatocellular carcinoma/intrahepatic vein thrombosis/portal vein thrombosis ?-Elevated AFP ?-GI consulted for further work-up>> recommending input from oncologist Alyssa Pena --- pending consult and evaluation ? ?-High suspicion for hepatocellular carcinoma updated patient of this at the bedside will eventually need set up with oncology ?-AFP tumor marker 423.6 markedly elevated, ? ? ? ?MR IMPRESSION: ?1. Morphologic features of liver compatible with cirrhosis. Stigmata ?of portal venous hypertension identified including splenomegaly and ?large left upper quadrant varicosities. ?2. Expansile thrombus involving the middle and right intrahepatic ?branches of the portal vein. Despite marked motion artifact and ?suboptimal bolus timing of the contrast administration there is ?measurable enhancement within the portal vein thrombus, which is ?concerning for tumor thrombus likely secondary to [facility] ?3. Evaluation for underlying enhancing liver lesions (i.e. [place]) is ?significantly limited due to suboptimal timing of the contrast bolus ?as well as excessive motion artifact. Within this limitation, given ?the elevated AFP and presence of enhancing thrombus within the ?portal vein I am highly concerning for underlying a hepatocellular ?carcinoma. Recommend contrast enhanced liver protocol CT for  further ?evaluation. This should be less susceptible to motion artifact and ?may allow for more optimal contrast timing. ?4. New 1.8 cm enhancing nodule within the right adrenal gland is ?identified. This is a new finding compared with 12/24/2018. Cannot ?exclude metastatic disease. ?5. Gallstones. Mild increase in caliber of the common bile duct ?without evidence for choledocholithiasis. ? ? ? ? ?-Appreciate oncology Alyssa Pena, and GI recommendations regarding above findings including possible portal vein thrombosis ? ? ? ? ? ? ? Advance Care Planning:   Code Status: DNR  ? ?Consults: GI/oncology ?Physical Exam: ?Blood pressure 116/61, pulse 64, temperature 97.8 ?F (36.6 ?C), temperature source Oral, resp. rate 17, height 5' 5"  (1.651 m), weight 79.3 kg, SpO2 97 %. ? ? ? ?Physical Exam: ?  ?General:  AAO x 3,  cooperative, no distress;   ?HEENT:  Normocephalic, PERRL, otherwise with in Normal limits   ?Neuro:  CNII-XII intact. , normal motor and sensation, reflexes intact   ?Lungs:   Clear to auscultation BL, Respirations unlabored,  ?No wheezes / crackles  ?Cardio:    S1/S2, RRR, No murmure, No Rubs or Gallops   ?Abdomen:  Soft, non-tender, bowel sounds active all four quadrants, ?no guarding or peritoneal signs.  ?Muscular  ?skeletal:  Limited exam -global generalized weaknesses ?- in bed, able to move all 4 extremities,   ?2+ pulses,  symmetric, No pitting edema  ?Skin:  Dry, warm to touch, negative for any Rashes,  ?Wounds: Please see nursing documentation ?   ?No acute deformities or trauma, no asymmetry in tone, no peripheral edema, peripheral pulses palpated, no tenderness to palpation in the extremities ?  ? ?  Latest Ref Rng & Units 10/15/2021  ? 11:16 AM 10/14/2021  ?  7:38 AM 10/13/2021  ?  5:21 AM  ?CMP  ?Glucose 70 - 99 mg/dL 96   93   92    ?BUN 8 - 23 mg/dL 14   19   33    ?Creatinine 0.44 - 1.00 mg/dL 0.93   1.25   1.89    ?Sodium 135 - 145 mmol/L 136   135   135    ?Potassium 3.5 - 5.1 mmol/L 3.8    3.4   3.5    ?Chloride 98 - 111 mmol/L 112   111   107    ?CO2 22 - 32 mmol/L 21   21   22     ?Calcium 8.9 - 10.3 mg/dL 8.1   7.9   8.0    ?Total Protein 6.5 - 8.1 g/dL 6.8    6.0    ?Total Bilirubin 0.3 - 1

## 2021-10-15 NOTE — Plan of Care (Addendum)
?  Problem: Acute Rehab PT Goals(only PT should resolve) ?Goal: Pt Will Go Supine/Side To Sit ?Outcome: Progressing ?Flowsheets (Taken 10/15/2021 1436) ?Pt will go Supine/Side to Sit: ? with modified independence ? Independently ?Goal: Patient Will Transfer Sit To/From Stand ?Outcome: Progressing ?Flowsheets (Taken 10/15/2021 1436) ?Patient will transfer sit to/from stand: ? with modified independence ? with min guard assist ?Goal: Pt Will Transfer Bed To Chair/Chair To Bed ?Outcome: Progressing ?Flowsheets (Taken 10/15/2021 1436) ?Pt will Transfer Bed to Chair/Chair to Bed: ? with modified independence ? min guard assist ?Goal: Pt Will Perform Standing Balance Or Pre-Gait ?Outcome: Progressing ?Flowsheets (Taken 10/15/2021 1436) ?Pt will perform standing balance or pre-gait: ? with Modified Independent ? Independently ? with min guard assist ?Goal: Pt Will Ambulate ?Outcome: Progressing ?Flowsheets (Taken 10/15/2021 1436) ?Pt will Ambulate: ? 75 feet ? with min guard assist ?Goal: Pt Will Go Up/Down Stairs ?Outcome: Progressing ?Flowsheets (Taken 10/15/2021 1436) ?Pt will Go Up / Down Stairs: ? 6-9 stairs ? with min guard assist ? with supervision ? ?2:37 PM, 10/15/21 ?Lestine Box, S/PT  ? ?During this treatment session, the therapist was present, participating in and directing the treatment. ? ?2:40 PM, 10/15/21 ?Lonell Grandchild, MPT ?Physical Therapist with [place] ?[facility] ?[phone] office ?[phone] mobile phone ? ?  ?

## 2021-10-16 DIAGNOSIS — N179 Acute kidney failure, unspecified: Secondary | ICD-10-CM | POA: Diagnosis not present

## 2021-10-16 DIAGNOSIS — R772 Abnormality of alphafetoprotein: Secondary | ICD-10-CM

## 2021-10-16 DIAGNOSIS — K7460 Unspecified cirrhosis of liver: Secondary | ICD-10-CM | POA: Diagnosis not present

## 2021-10-16 DIAGNOSIS — R197 Diarrhea, unspecified: Secondary | ICD-10-CM

## 2021-10-16 DIAGNOSIS — C220 Liver cell carcinoma: Secondary | ICD-10-CM | POA: Diagnosis not present

## 2021-10-16 DIAGNOSIS — R7989 Other specified abnormal findings of blood chemistry: Secondary | ICD-10-CM

## 2021-10-16 DIAGNOSIS — K769 Liver disease, unspecified: Secondary | ICD-10-CM

## 2021-10-16 MED ORDER — ENOXAPARIN SODIUM 40 MG/0.4ML IJ SOSY
40.0000 mg | PREFILLED_SYRINGE | INTRAMUSCULAR | Status: DC
  Filled 2021-10-16: qty 0.4

## 2021-10-16 MED ORDER — DICYCLOMINE HCL 10 MG PO CAPS
10.0000 mg | ORAL_CAPSULE | Freq: Every day | ORAL | Status: DC
  Administered 2021-10-17 – 2021-10-18 (×2): 10 mg via ORAL
  Filled 2021-10-16 (×2): qty 1

## 2021-10-16 NOTE — Progress Notes (Signed)
? ? ?Subjective: ?Had an episode of slight nausea today, but no vomiting. Tolerating her diet. Denies abdominal pain. Some SOB with getting up to the bathroom. Primary complaint is diarrhea which has been an issue for several months, but was well controlled with dicyclomine outpatient.  She was taking it up to 3 times a day and states it was actually causing a little bit of constipation.  Currently having about 4 loose to watery bowel movements daily which is the same prior to starting dicyclomine.  Associated lower abdominal cramping prior to bowel movements that improves thereafter.  Saw Dr. Melony Overly in April and will started on dicyclomine for suspected IBS.  Has not been receiving this inpatient.  ? ?Objective: ?Vital signs in last 24 hours: ?Temp:  [97.7 ?F (36.5 ?C)-98 ?F (36.7 ?C)] 98 ?F (36.7 ?C) (05/02 5784) ?Pulse Rate:  [60-67] 66 (05/02 0904) ?Resp:  [18-19] 18 (05/02 0452) ?BP: (115-145)/(55-63) 145/58 (05/02 0904) ?SpO2:  [98 %-99 %] 98 % (05/02 0452) ?Last BM Date : 10/16/21 ?General:   Alert and oriented, pleasant, NAD.  ?Head:  Normocephalic and atraumatic. ?Eyes:  + scleral icterus. ?Abdomen:  Bowel sounds present, soft, non-distended. Mild TTP across upper abdomen, but patient reports this is improved. No HSM or hernias noted. No rebound or guarding. ?Msk:  Symmetrical without gross deformities. Normal posture. ?Extremities:  Without edema. ?Neurologic:  Alert and  oriented x4;  grossly normal neurologically. ?Skin:  Warm and dry, intact without significant lesions.  ?Psych:  Normal mood and affect. ? ?Intake/Output from previous day: ?05/01 0701 - 05/02 0700 ?In: 5171.6 [P.O.:720; I.V.:4199.7; IV Piggyback:251.9] ?Out: -  ?Intake/Output this shift: ?Total I/O ?In: 120 [P.O.:120] ?Out: -  ? ?Lab Results: ?Recent Labs  ?  10/14/21 ?6962  ?WBC 8.2  ?HGB 8.3*  ?HCT 26.5*  ?PLT 85*  ? ?BMET ?Recent Labs  ?  10/14/21 ?0738 10/15/21 ?1116  ?NA 135 136  ?K 3.4* 3.8  ?CL 111 112*  ?CO2 21* 21*  ?GLUCOSE 93  96  ?BUN 19 14  ?CREATININE 1.25* 0.93  ?CALCIUM 7.9* 8.1*  ? ?LFT ?Recent Labs  ?  10/15/21 ?1116  ?PROT 6.8  ?ALBUMIN <1.5*  ?AST 95*  ?ALT 43  ?ALKPHOS 139*  ?BILITOT 5.2*  ? ?PT/INR ?Recent Labs  ?  10/15/21 ?1116  ?LABPROT 16.0*  ?INR 1.3*  ? ? ?Studies/Results: ?CT ABDOMEN PELVIS W WO CONTRAST ? ?Result Date: 10/15/2021 ?CLINICAL DATA:  Cirrhosis and portal vein thrombosis. Evaluate for possible HCC. EXAM: CT ABDOMEN AND PELVIS WITHOUT AND WITH CONTRAST TECHNIQUE: Multidetector CT imaging of the abdomen and pelvis was performed following the standard protocol before and following the bolus administration of intravenous contrast. RADIATION DOSE REDUCTION: This exam was performed according to the departmental dose-optimization program which includes automated exposure control, adjustment of the mA and/or kV according to patient size and/or use of iterative reconstruction technique. CONTRAST:  133m OMNIPAQUE IOHEXOL 300 MG/ML  SOLN COMPARISON:  MRI abdomen 10/12/2021 FINDINGS: Lower chest: Subpleural atelectasis at the right lung base but no worrisome pulmonary lesions. The heart is within normal limits in size. No pericardial effusion. Hepatobiliary: Advanced cirrhotic changes involving the liver with portal venous hypertension, portal venous collaterals and splenomegaly. Extensive main portal vein thrombosis. Reconstitution of the left portal vein with intrahepatic blood flow. The middle and right portal veins are completely occluded. Diffuse and fairly marked enhancement of the thrombus and probable surrounding infiltrating tumor. There also numerous arterial phase enhancing lesions suggesting small HCC's or dysplastic  nodules. Because of the thrombosed portal vein there is a very bizarre enhancement pattern in the liver. In segment 6 there is enhancement of the distended portal vein but also some enhancement and subsequent low-attenuation in the surrounding parenchyma. Findings likely infiltrative HCC. This  liver is considered LI-RADS TIV. Pancreas: No mass, inflammation or ductal dilatation. Spleen: Splenomegaly with extensive perisplenic collaterals but no splenic lesions. Adrenals/Urinary Tract: 2 cm right adrenal gland lesion, not present in 2020 and demonstrating high attenuation, contrast enhancement and no significant washout suggesting a metastatic focus. The left adrenal gland is difficult to identify because of the surrounding collateral vessels but no obvious lesion. Bilateral renal calculi but no worrisome renal lesions. Stomach/Bowel: Stomach, duodenum, small bowel and colon are grossly normal. Vascular/Lymphatic: Atherosclerotic calcifications involving the aorta but no aneurysm dissection. No mesenteric or retroperitoneal mass or adenopathy. Reproductive: The uterus is surgically absent. Both ovaries are still present and appear normal. Other: Small amount of free pelvic fluid but no overt ascites. Musculoskeletal: No significant bony findings. IMPRESSION: 1. Advanced cirrhotic changes involving the liver with portal venous hypertension, portal venous collaterals and splenomegaly. 2. Extensive main portal vein thrombosis extending into the middle and right portal veins which are markedly distended and demonstrate enhancement. Findings consistent with tumor thrombus. The left portal vein is reconstituted. 3. Numerous arterial phase enhancing lesions suggesting small HCC's or dysplastic nodules. 4. In segment 6 there is enhancement of the distended right portal vein but also some enhancement and subsequent low-attenuation in the surrounding parenchyma. Findings likely infiltrative HCC. This liver is considered LI-RADS TIV (diffuse tumor thrombus). 5. 2 cm right adrenal gland lesion worrisome for metastatic disease. Aortic Atherosclerosis (ICD10-I70.0). Electronically Signed   By: Marijo Sanes M.D.   On: 10/15/2021 15:19   ? ?Assessment: ?71 y.o. female with a history of alcohol abuse, alcoholic hepatitis  and cirrhosis, hep C with detectable viral load, chronic thrombocytopenia, Anxiety, CHF, depression, HTN, who presented to the ED with reported fever and jaundice on 4/27.  Cross-sectional imaging including CT and MRI/MRCP with findings concerning for [facility], possible adrenal metastatic disease.  Also with extensive main portal vein thrombus consistent with tumor thrombus.  aFP elevated at 423. ? ?Initially, there was some concern about cholangitis, but patient has been afebrile since admission, only with slight leukocytosis of 11.5 on 4/28 which normalized on 4/29, and nausea/vomiting resolved.  It was felt the patient did not have cholangitis, and no choledocholithiasis was noted on imaging. Had recommended considering discontinuation of antibiotics over the weekend; antibiotics officially discontinued today s/p 5 day course. Overall, it was felt that her abdominal pain was likely related to suspected [facility] though it is possible that she passed a stone. We had recommended oncology consult and signed off yesterday, but Dr. Roger Shelter asked me to see patient again today to document final recommendations. Oncology consult still pending.  ? ?Clinically, she is feeling improved. Denies abdominal pain, but mild TTP across the upper abdomen on exam today. No vomiting and tolerating her diet well. No leukocytosis. Bilirubin has increased to 5.2 from 3.7 yesterday. This is likely related to [facility], cirrhosis, chronic Hep C.  Doubt choledocholithiasis without worsening abdominal pain or fever.  No additional GI needs at this time. Needs oncology consult.  ? ?Regarding her portal vein thrombus, this is suspected to be tumor thrombus without need for anticoagulation, but will defer additional evaluation to oncology.  ? ?Diarrhea:  ?Chronic. Likely secondary to IBS.  Previously well controlled on dicyclomine outpatient, but  hasn't received inpatient.  We will restart this. ? ?Plan: ?Needs oncology consult. Dr. Jamesetta Geralds has reached out  to Dr. Delton Coombes personally to see about getting this completed ASAP.  ?Consider restarting antibiotics to complete 7 day course if fever returns.  ?Consider re-imaging if she develops acute worsening

## 2021-10-16 NOTE — Consult Note (Signed)
[facility] ?Consultation Oncology ? ?Name: Alyssa Pena      MRN: [id number]    Location: N829/F621-30  Date: 10/16/2021 Time:4:56 PM ? ? ?REFERRING PHYSICIAN: Dr. Roger Shelter ? ?REASON FOR CONSULT: Highly likely metastatic HCC to the right adrenal gland. ?  ? ?HISTORY OF PRESENT ILLNESS: Alyssa Pena is a 71 year old very pleasant white female who is seen in consultation today at the request of Dr. Roger Shelter for findings suggestive of liver cancer metastatic to the adrenal gland.  She was admitted with weakness and was found to have jaundice.  MRI of the liver on 10/12/2021 showed cirrhosis, portal hypertension, splenomegaly, left upper quadrant varicosities.  Expansile thrombus involving middle and right intrahepatic branches of the portal vein.  Liver lesions evaluation was limited due to motion artifact.  AFP was elevated at 423.  CTAP with and without contrast on 10/15/2021 showed enhancement of distended portal vein with some enhancement and subsequent low-attenuation in the surrounding parenchyma with findings likely infiltrative HCC.  She reports that she is slightly feeling better.  She reported confusion in the first couple of days after admission.  She denies any hematemesis.  She has some rectal bleeding and thought it was from chronic hemorrhoids.  She had history of heavy alcohol use with 1 pint of vodka daily.  She quit about 2 years ago.  She lives at home independently along with her 2 dogs.  Her sister lives close by.  She also has 2 stepsons. ? ?PAST MEDICAL HISTORY:   ?Past Medical History:  ?Diagnosis Date  ? Alcohol dependence ([place]) 08/10/2013  ? Alcoholic hepatitis 8/65/7846  ? Anxiety   ? Arthritis   ? Bruises easily   ? CHF (congestive heart failure) ([place])   ? Cirrhosis ([place]) 08/10/2013  ? Depression   ? Dysrhythmia   ? before medication  ? Fluid retention   ? Full dentures   ? upper  ? Hypertension   ? Portal venous hypertension ([place]) 08/10/2013  ? Renal disorder   ? Thrombocytopenia ([place])  08/10/2013  ? ? ?ALLERGIES: ?Allergies  ?Allergen Reactions  ? Penicillins Hives  ? Tape   ?  Bruises her skin  ? ?   ?MEDICATIONS: I have reviewed the patient's current medications.   ?  ?PAST SURGICAL HISTORY ?Past Surgical History:  ?Procedure Laterality Date  ? ABDOMINAL HYSTERECTOMY    ? BREAST SURGERY    ? lumpectomy benign  ? CATARACT EXTRACTION W/PHACO Left 08/30/2016  ? Procedure: CATARACT EXTRACTION PHACOEMULSIFICATION AND INTRAOCULAR LENS PLACEMENT LEFT EYE CDE - 46.93;  Surgeon: Baruch Goldmann, MD;  Location: [facility];  Service: Ophthalmology;  Laterality: Left;  left  ? CATARACT EXTRACTION W/PHACO Right 06/20/2017  ? Procedure: CATARACT EXTRACTION PHACO AND INTRAOCULAR LENS PLACEMENT RIGHT EYE;  Surgeon: Baruch Goldmann, MD;  Location: [facility];  Service: Ophthalmology;  Laterality: Right;  right  ? CYSTOSCOPY WITH RETROGRADE PYELOGRAM, URETEROSCOPY AND STENT PLACEMENT Right 10/17/2018  ? Procedure: Hernandez, URETEROSCOPY AND STENT PLACEMENT;  Surgeon: Ardis Hughs, MD;  Location: [facility];  Service: Urology;  Laterality: Right;  ? CYSTOSCOPY/URETEROSCOPY/HOLMIUM LASER/STENT PLACEMENT N/A 01/04/2019  ? Procedure: CYSTOSCOPY/URETEROSCOPY/STENT PLACEMENT;  Surgeon: Raynelle Bring, MD;  Location: [facility];  Service: Urology;  Laterality: N/A;  ? IR URETERAL STENT RIGHT NEW ACCESS W/O SEP NEPHROSTOMY CATH  12/24/2018  ? KIDNEY STONE SURGERY    ? NEPHROLITHOTOMY Right 12/24/2018  ? Procedure: NEPHROLITHOTOMY PERCUTANEOUS;  Surgeon: Raynelle Bring, MD;  Location: [facility];  Service: Urology;  Laterality: Right;  ? URETEROSCOPY WITH HOLMIUM LASER LITHOTRIPSY Right 12/24/2018  ? Procedure: URETEROSCOPY;  Surgeon: Raynelle Bring, MD;  Location: [facility];  Service: Urology;  Laterality: Right;  ? ? ?FAMILY HISTORY: ?Family History  ?Problem Relation Age of Onset  ? Lung cancer Father   ? Lung cancer Brother   ? COPD Brother   ? Dementia Mother   ? Arthritis Sister   ? High blood pressure Sister   ? COPD  Sister   ? ? ?SOCIAL HISTORY: ? reports that she has quit smoking. Her smoking use included cigarettes. She has never used smokeless tobacco. She reports that she does not currently use alcohol. She reports that she does not currently use drugs. ? ?PERFORMANCE STATUS: ?The patient's performance status is 2 - Symptomatic, <50% confined to bed ? ?PHYSICAL EXAM: ?Most Recent Vital Signs: Blood pressure (!) 160/69, pulse 65, temperature 97.7 ?F (36.5 ?C), resp. rate 20, height '5\' 5"'$  (1.651 m), weight 174 lb 14.4 oz (79.3 kg), SpO2 95 %. ?BP (!) 160/69 (BP Location: Right Wrist)   Pulse 65   Temp 97.7 ?F (36.5 ?C)   Resp 20   Ht '5\' 5"'$  (1.651 m)   Wt 174 lb 14.4 oz (79.3 kg)   SpO2 95%   BMI 29.10 kg/m?  ?General appearance: alert, cooperative, and appears stated age ?Lungs: clear to auscultation bilaterally ?Heart: regular rate and rhythm ?Abdomen:  Soft, tenderness in the right upper quadrant with no palpable masses. ?Extremities:  1+ edema bilaterally. ?Neurologic: Grossly normal ? ?LABORATORY DATA:  ?Results for orders placed or performed during the hospital encounter of 10/11/21 (from the past 48 hour(s))  ?Comprehensive metabolic panel     Status: Abnormal  ? Collection Time: 10/15/21 11:16 AM  ?Result Value Ref Range  ? Sodium 136 135 - 145 mmol/L  ? Potassium 3.8 3.5 - 5.1 mmol/L  ? Chloride 112 (H) 98 - 111 mmol/L  ? CO2 21 (L) 22 - 32 mmol/L  ? Glucose, Bld 96 70 - 99 mg/dL  ?  Comment: Glucose reference range applies only to samples taken after fasting for at least 8 hours.  ? BUN 14 8 - 23 mg/dL  ? Creatinine, Ser 0.93 0.44 - 1.00 mg/dL  ? Calcium 8.1 (L) 8.9 - 10.3 mg/dL  ? Total Protein 6.8 6.5 - 8.1 g/dL  ? Albumin <1.5 (L) 3.5 - 5.0 g/dL  ? AST 95 (H) 15 - 41 U/L  ? ALT 43 0 - 44 U/L  ? Alkaline Phosphatase 139 (H) 38 - 126 U/L  ? Total Bilirubin 5.2 (H) 0.3 - 1.2 mg/dL  ? GFR, Estimated >60 >60 mL/min  ?  Comment: (NOTE) ?Calculated using the CKD-EPI Creatinine Equation (2021) ?  ? Anion gap 3 (L)  5 - 15  ?  Comment: Performed at [facility], [address], [place], [place] [address]  ?Protime-INR     Status: Abnormal  ? Collection Time: 10/15/21 11:16 AM  ?Result Value Ref Range  ? Prothrombin Time 16.0 (H) 11.4 - 15.2 seconds  ? INR 1.3 (H) 0.8 - 1.2  ?  Comment: (NOTE) ?INR goal varies based on device and disease states. ?Performed at [facility], [address] [place], [place] [address] ?  ?   ? ?RADIOGRAPHY: ?CT ABDOMEN PELVIS W WO CONTRAST ? ?Result Date: 10/15/2021 ?CLINICAL DATA:  Cirrhosis and portal vein thrombosis. Evaluate for possible HCC. EXAM: CT ABDOMEN AND PELVIS WITHOUT AND WITH CONTRAST TECHNIQUE: Multidetector CT imaging of the  abdomen and pelvis was performed following the standard protocol before and following the bolus administration of intravenous contrast. RADIATION DOSE REDUCTION: This exam was performed according to the departmental dose-optimization program which includes automated exposure control, adjustment of the mA and/or kV according to patient size and/or use of iterative reconstruction technique. CONTRAST:  177m OMNIPAQUE IOHEXOL 300 MG/ML  SOLN COMPARISON:  MRI abdomen 10/12/2021 FINDINGS: Lower chest: Subpleural atelectasis at the right lung base but no worrisome pulmonary lesions. The heart is within normal limits in size. No pericardial effusion. Hepatobiliary: Advanced cirrhotic changes involving the liver with portal venous hypertension, portal venous collaterals and splenomegaly. Extensive main portal vein thrombosis. Reconstitution of the left portal vein with intrahepatic blood flow. The middle and right portal veins are completely occluded. Diffuse and fairly marked enhancement of the thrombus and probable surrounding infiltrating tumor. There also numerous arterial phase enhancing lesions suggesting small HCC's or dysplastic nodules. Because of the thrombosed portal vein there is a very bizarre enhancement pattern in the liver. In segment 6 there is enhancement  of the distended portal vein but also some enhancement and subsequent low-attenuation in the surrounding parenchyma. Findings likely infiltrative HCC. This liver is considered LI-RADS TIV. Pancreas: No mass, in

## 2021-10-16 NOTE — Progress Notes (Addendum)
?                                                  Progress note                                           ? ?Patient: Alyssa Pena FMB:[id number] DOB: [birth year] ?DOA: 10/11/2021 ?DOS: the patient was seen and examined on 10/16/2021 ?PCP: Neale Burly, MD  ?Patient coming from: Home ? ?Chief Complaint: Fever  ? ?Subjective: ? ?The patient was seen and examined this morning, stable laying in bed in no acute distress.  Stating she is feeling better, improved abdominal pain but still complaining of area. ? ? ?------------------------------------------------------------------------------------------------------------ ?Per HPI: Alyssa Pena is a 71 y.o. female with medical history significant of Hall dependence, alcoholic hepatitis, CHF, cirrhosis, depression, hypertension, portal venous hypertension, thrombocytopenia, and more presents ED with a chief complaint of fever.  Patient recently saw GI clinic on 25 April.  She was referred there for possible ERCP.  Patient has been experiencing intermittent epigastric pain radiating to her right upper quadrant and posteriorly for the last 1 month.  She also had nausea and vomiting.  An ultrasound was ordered on the end of March that showed hepatic cirrhosis and right hepatic cyst, thrombus in the portal vein thought to be chronic.  Also revealed a dilated common bile duct at 10 mm with cholelithiasis.   ?Patient also has a history of positive HCV antibody.  Today patient reports a Tmax of 104.  She did not take Tylenol or ibuprofen for the fever secondary to her cirrhosis.  She had no fever at presentation.  Patient does report right upper quadrant pain.  Is not painful at rest.  Attempting to take p.o. makes the pain worse ?Patient does report that she has had dysuria and thought she had a UTI.  It started last week, around the same time that the fever started.   ? ?She did get her COVID shot.  Patient is  DNR. ? ?-------------------------------------------------------------------------------------------------------------- ?Data Reviewed: ?In the ED ?Temp 97.8, heart rate 60-77, respiratory rate 16-25, blood pressure 97/52-116/75, satting 92-97% on room air ?No leukocytosis, thrombocytopenia 103 ?Chemistry reveals a bump in BUN at 30, bump in creatinine 2.55 ?Alk phos 147, AST 109, ALT 51 ?Albumin 1.7 ?T. bili 4.1 ?CT abdomen pelvis shows no bowel obstruction.  Cirrhosis.  Suspected cavernous transformation of portal vein with abdominal varices.  No ascites.  3.0 right adrenal nodule that could be metastasis and should be followed up with CT in 4 to 6 weeks.  No CBD ductal dilatation. ?Alcohol level less than 10 ?GI consulted from the ED and recommends starting ertapenem admit to [facility]. ? ? ?Assessment and Plan: ? ? ?Cholangitis -concern for hepatocellular carcinoma ? ?-Improved symptoms, LFT briefly improved TSV:[id number] AST, alk phos, elevated T-Bili 3.7 >>5.2  ? ?-Cholangitis was ruled out by GI ?- Fever Tmax. 104 at home, right upper quadrant pain, T. bili 4.1 ?-Patient was told by GI office to present to the ED if she develops a fever ?-GI consulted from the ED and reported to start her on Ertapenem (penicillin allergy) and they will follow ?-IV Abx Ertapenem--10/16/21 conclude 7 days of treatment ?-MRCP --by GI inconclusive ?-  GI concern for hepatocellular carcinoma... Consulted oncologist Dr. Ames Coupe ?-Appreciate input from neurology Dr. Delton Coombes ? ?Elevated liver function tests ?- Based on GI work-up imaging, finding consistent with hepatocellular carcinoma including elevated LFTs, ?-Pending oncology Dr. Delton Coombes evaluation recommendation ?-Monitoring LFTs ?-Possible portal vein thrombosis versus mass effect- recommendation for anticoagulation per Dr. Delton Coombes ? ? ?Ductal dilatation of CBD had been seen recently, but on CT there was no ductal dilatation ?Cholangitis  -was ruled out by GI ? ? ?  Latest Ref  Rng & Units 10/15/2021  ? 11:16 AM 10/13/2021  ?  5:21 AM 10/12/2021  ?  3:33 AM  ?Hepatic Function  ?Total Protein 6.5 - 8.1 g/dL 6.8   6.0   5.9    ?Albumin 3.5 - 5.0 g/dL <1.5   <1.5   <1.5    ?AST 15 - 41 U/L 95   83   89    ?ALT 0 - 44 U/L 43   41   42    ?Alk Phosphatase 38 - 126 U/L 139   120   122    ?Total Bilirubin 0.3 - 1.2 mg/dL 5.2   3.7   3.5    ? ? ? ? ? ?* AKI (acute kidney injury) ([place]) ?-AKI with creatinine baseline 1.0,  ?- Creatinine on admission 2.5 >> 1.89, 1.25, 0.93 today ?-Avoid nephrotoxic agents when possible ?-IVF  ?-Continue to monitor ? ?Hypoalbuminemia ?-Albumin  ?-Secondary to cirrhosis and poor p.o. intake ?-Encourage nutrient dense food choices when patient is able to take p.o. ? ?Benign essential HTN ?Continue Coreg ?Holding ACE in the setting of AKI ? ?Cirrhosis ([place]) ?-Secondary to history of alcohol dependence, hepatitis ?HCV RNA, 246 K,  ?HIV nonreactive ?-Quit drinking 2 years ago ?-Holding home Lasix secondary to AKI ?-Continue to monitor ? ?UTI : ?Urine E. coli, pansensitive ?--Completed course of antibiotics on this admission 10/16/2021 ? ?Concern for hepatocellular carcinoma/ with intrahepatic vein thrombosis/portal vein thrombosis ?-Suspected to be a tumor thrombosis ?-Elevated AFP ?-GI consulted for further work-up>> recommending input from oncologist Dr. Delton Coombes --- pending consult and evaluation ? ?-High suspicion for hepatocellular carcinoma updated patient of this at the bedside will eventually need set up with oncology ? ?-AFP tumor marker 423.6 markedly elevated, ? ? ? ?MR IMPRESSION: ?1. Morphologic features of liver compatible with cirrhosis. Stigmata ?of portal venous hypertension identified including splenomegaly and ?large left upper quadrant varicosities. ?2. Expansile thrombus involving the middle and right intrahepatic ?branches of the portal vein. Despite marked motion artifact and ?suboptimal bolus timing of the contrast administration there is ?measurable  enhancement within the portal vein thrombus, which is ?concerning for tumor thrombus likely secondary to [facility]. ?3. Evaluation for underlying enhancing liver lesions (i.e. [place]) is ?significantly limited due to suboptimal timing of the contrast bolus ?as well as excessive motion artifact. Within this limitation, given ?the elevated AFP and presence of enhancing thrombus within the ?portal vein I am highly concerning for underlying a hepatocellular ?carcinoma. Recommend contrast enhanced liver protocol CT for further ?evaluation. This should be less susceptible to motion artifact and ?may allow for more optimal contrast timing. ?4. New 1.8 cm enhancing nodule within the right adrenal gland is ?identified. This is a new finding compared with 12/24/2018. Cannot ?exclude metastatic disease. ?5. Gallstones. Mild increase in caliber of the common bile duct ?without evidence for choledocholithiasis. ? ? ? ? ?-Appreciate oncology Dr. Delton Coombes, and GI recommendations regarding above findings including possible portal vein thrombosis ? ? ?-GI has signed off, concluding  their work-up ? ? ? ?Addendum:  ?Discussed the case with Dr. Delton Coombes ?He " agreed with no anticoagulation as it is tumor thrombus.  She has child class B/C which makes her not a good candidate for any systemic therapy.  He is recommending comfort care with hospice ? ? ? ? ? ? ? Advance Care Planning:   Code Status: DNR  ? ?Disposition: From home likely to be discharged home in 1-2 days ?With close follow-up with oncologist and GI ? ? ? ?Consults: GI/oncology ? ? ? ? ?Physical Exam: ?Blood pressure 116/61, pulse 64, temperature 97.8 ?F (36.6 ?C), temperature source Oral, resp. rate 17, height 5' 5"  (1.651 m), weight 79.3 kg, SpO2 97 %. ?  ?General:  AAO x 3,  cooperative, no distress;   ?HEENT:  Normocephalic, PERRL, otherwise with in Normal limits   ?Neuro:  CNII-XII intact. , normal motor and sensation, reflexes intact   ?Lungs:   Clear to auscultation BL,  Respirations unlabored,  ?No wheezes / crackles  ?Cardio:    S1/S2, RRR, No murmure, No Rubs or Gallops   ?Abdomen:  Soft, non-tender, bowel sounds active all four quadrants, ?no guarding or peritoneal signs.  ?Muscular  ?skeletal:  Limited exam -global generalized weakn

## 2021-10-16 NOTE — Assessment & Plan Note (Signed)
-   Based on GI work-up imaging, finding consistent with hepatocellular carcinoma including elevated LFTs, including T. Bili ?-Pending oncology Dr. Delton Coombes evaluation recommendation ?-Monitoring LFTs ?-Possible portal vein thrombosis versus mass effect-and recommendation per Dr. Raliegh Ip for anticoagulation ?

## 2021-10-16 NOTE — [facility] Oncology Navigator Note (Signed)
Met with patient with Dr. Delton Coombes. Called and updated patient's sister, Fraser Din per patient's request. Fraser Din verbalized understanding of patient's visit with Dr. Delton Coombes and all questions were addressed and answered to the family's satisfaction. Patient's sister would like to be present for Dr. Tomie China next visit with the patient tomorrow. Dr. Delton Coombes aware. ?

## 2021-10-17 ENCOUNTER — Encounter (HOSPITAL_COMMUNITY): Payer: Self-pay | Admitting: Family Medicine

## 2021-10-17 DIAGNOSIS — Z515 Encounter for palliative care: Secondary | ICD-10-CM

## 2021-10-17 DIAGNOSIS — C220 Liver cell carcinoma: Secondary | ICD-10-CM | POA: Diagnosis not present

## 2021-10-17 DIAGNOSIS — N390 Urinary tract infection, site not specified: Secondary | ICD-10-CM

## 2021-10-17 DIAGNOSIS — Z7189 Other specified counseling: Secondary | ICD-10-CM

## 2021-10-17 MED ORDER — LORAZEPAM 0.5 MG PO TABS: 0.5000 mg | ORAL_TABLET | Freq: Four times a day (QID) | ORAL | Status: DC | PRN

## 2021-10-17 NOTE — Telephone Encounter (Signed)
Dr. Laural Golden was given message and he is aware she is in hospital.  ?

## 2021-10-17 NOTE — Progress Notes (Signed)
?PROGRESS NOTE ? ? ? ?Alyssa Pena  NWG:[id number] DOB: Jan 31, 1951 DOA: 10/11/2021 ?PCP: Neale Burly, MD  ? ?  ?Brief Narrative:  ?Alyssa Pena is a 71 y.o. female with medical history significant of alcohol dependence, alcoholic hepatitis, CHF, cirrhosis, depression, hypertension, portal venous hypertension, thrombocytopenia, and more who presented ED with a chief complaint of fever.  Patient recently saw GI clinic on April 25.  She was referred there for possible ERCP.  Patient has been experiencing intermittent epigastric pain radiating to her right upper quadrant and posteriorly for the last 1 month.  She also had nausea and vomiting.  An ultrasound was ordered on the end of March that showed hepatic cirrhosis and right hepatic cyst, thrombus in the portal vein thought to be chronic.  Also revealed a dilated common bile duct at 10 mm with cholelithiasis.   ?CT abdomen pelvis shows no bowel obstruction.  Cirrhosis.  Suspected cavernous transformation of portal vein with abdominal varices.  No ascites.  3.0 right adrenal nodule that could be metastasis and should be followed up with CT in 4 to 6 weeks.  No CBD ductal dilatation. ? ?GI was consulted, felt that patient did not have cholangitis.  Antibiotics were discontinued.  Oncology was consulted due to concern for hepatocellular carcinoma with metastasis.  He did not feel that patient would benefit on chemotherapy.  He is recommended palliative care and hospice evaluation. ? ?New events last 24 hours / Subjective: ?Patient admits to right upper quadrant abdominal pain.  She voiced adequate understanding of her conversation with Dr. Delton Coombes yesterday.  Awaiting family meeting with her sister this afternoon for further discussion with oncology, palliative care medicine. ? ?Assessment & Plan: ?  ? ?Principal Problem: ?  Hepatocellular carcinoma ([place]) ?Active Problems: ?  Cirrhosis ([place]) ?  AKI (acute kidney injury) ([place]) ?  Benign essential HTN ?  RUQ pain ?   Elevated liver function tests ?  Hypoalbuminemia ?  Malnutrition of moderate degree ?  Diarrhea ?  Elevated AFP ?  Lesion of liver ?  UTI (urinary tract infection) ? ? ?Infiltrative HCC with tumor thrombus in portal vein, cirrhosis ?-Appreciate oncology and GI ?-Recommended for palliative care/hospice evaluation ? ?Elevated LFTs ?-Cholangitis ruled out by GI ?-Completed 7-day treatment of ertapenem ? ?AKI ?-Resolved ? ?Hypertension ?-Coreg ? ?E. coli UTI ?-Completed antibiotic treatment ? ? ?DVT prophylaxis:  ?enoxaparin (LOVENOX) injection 40 mg Start: 10/16/21 1200 ?SCDs Start: 10/12/21 0325 ? ?Code Status: DNR ?Family Communication: None at bedside  ?Disposition Plan:  ?Status is: Inpatient ?Remains inpatient appropriate because: awaiting palliative care/hospice eval  ? ?Antimicrobials:  ?Anti-infectives (From admission, onward)  ? ? Start     Dose/Rate Route Frequency Ordered Stop  ? 10/15/21 2200  ceFAZolin (ANCEF) IVPB 1 g/50 mL premix  Status:  Discontinued       ? 1 g ?100 mL/hr over 30 Minutes Intravenous Every 8 hours 10/15/21 1223 10/16/21 1023  ? 10/14/21 2000  cefTRIAXone (ROCEPHIN) 1 g in sodium chloride 0.9 % 100 mL IVPB  Status:  Discontinued       ? 1 g ?200 mL/hr over 30 Minutes Intravenous Every 24 hours 10/14/21 1017 10/15/21 1223  ? 10/12/21 2300  ertapenem (INVANZ) 1,000 mg in sodium chloride 0.9 % 100 mL IVPB  Status:  Discontinued       ? 1 g ?200 mL/hr over 30 Minutes Intravenous Every 24 hours 10/12/21 0057 10/12/21 1102  ? 10/12/21 2300  ertapenem (INVANZ) 500 mg  in sodium chloride 0.9 % 50 mL IVPB  Status:  Discontinued       ? 500 mg ?100 mL/hr over 30 Minutes Intravenous Every 24 hours 10/12/21 1102 10/14/21 0725  ? 10/11/21 2245  ertapenem (INVANZ) 1,000 mg in sodium chloride 0.9 % 100 mL IVPB       ? 1 g ?200 mL/hr over 30 Minutes Intravenous  Once 10/11/21 2231 10/11/21 2344  ? ?  ? ? ? ?Objective: ?Vitals:  ? 10/16/21 0904 10/16/21 1212 10/16/21 2138 10/17/21 0331  ?BP: (!) 145/58  (!) 160/69 (!) 157/82 (!) 158/79  ?Pulse: 66 65 64 65  ?Resp:  '20 18 18  '$ ?Temp:  97.7 ?F (36.5 ?C) 97.7 ?F (36.5 ?C) 97.7 ?F (36.5 ?C)  ?TempSrc:    Oral  ?SpO2:  95% 96% 97%  ?Weight:      ?Height:      ? ? ?Intake/Output Summary (Last 24 hours) at 10/17/2021 1218 ?Last data filed at 10/17/2021 0900 ?Gross per 24 hour  ?Intake 720 ml  ?Output --  ?Net 720 ml  ? ?Filed Weights  ? 10/11/21 1912 10/12/21 0855  ?Weight: 78 kg 79.3 kg  ? ? ?Examination:  ?General exam: Appears calm and comfortable, jaundiced ?Respiratory system: Clear to auscultation. Respiratory effort normal. No respiratory distress. No conversational dyspnea.  ?Cardiovascular system: S1 & S2 heard, RRR. No murmurs. No pedal edema. ?Gastrointestinal system: Abdomen is nondistended, soft, tender to palpation right upper quadrant ?Central nervous system: Alert and oriented. No focal neurological deficits. Speech clear.  ?Extremities: Symmetric in appearance  ?Skin: No rashes, lesions or ulcers on exposed skin  ?Psychiatry: Judgement and insight appear normal. Mood & affect appropriate.  ? ?Data Reviewed: I have personally reviewed following labs and imaging studies ? ?CBC: ?Recent Labs  ?Lab 10/11/21 ?2038 10/12/21 ?0333 10/13/21 ?5009 10/14/21 ?3818  ?WBC 8.3 11.5* 8.7 8.2  ?NEUTROABS 7.2 8.7*  --  4.9  ?HGB 10.9* 8.8* 8.8* 8.3*  ?HCT 32.9* 26.2* 25.7* 26.5*  ?MCV 95.6 94.9 94.8 96.4  ?PLT 103* 86* 84* 85*  ? ?Basic Metabolic Panel: ?Recent Labs  ?Lab 10/11/21 ?2038 10/12/21 ?0333 10/13/21 ?2993 10/14/21 ?7169 10/15/21 ?1116  ?NA 133* 134* 135 135 136  ?K 3.8 3.9 3.5 3.4* 3.8  ?CL 104 105 107 111 112*  ?CO2 21* 23 22 21* 21*  ?GLUCOSE 135* 128* 92 93 96  ?BUN 30* 33* 33* 19 14  ?CREATININE 2.55* 2.53* 1.89* 1.25* 0.93  ?CALCIUM 8.2* 8.0* 8.0* 7.9* 8.1*  ?MG  --  1.5*  --   --   --   ? ?GFR: ?Estimated Creatinine Clearance: 58.6 mL/min (by C-G formula based on SCr of 0.93 mg/dL). ?Liver Function Tests: ?Recent Labs  ?Lab 10/11/21 ?2038 10/12/21 ?0333  10/13/21 ?6789 10/15/21 ?1116  ?AST 109* 89* 83* 95*  ?ALT 51* 42 41 43  ?ALKPHOS 147* 122 120 139*  ?BILITOT 4.1* 3.5* 3.7* 5.2*  ?PROT 7.2 5.9* 6.0* 6.8  ?ALBUMIN 1.7* <1.5* <1.5* <1.5*  ? ?No results for input(s): LIPASE, AMYLASE in the last 168 hours. ?Recent Labs  ?Lab 10/11/21 ?2038 10/12/21 ?0333  ?AMMONIA 30 34  ? ?Coagulation Profile: ?Recent Labs  ?Lab 10/11/21 ?2038 10/13/21 ?3810 10/15/21 ?1116  ?INR 1.2 1.1 1.3*  ? ?Cardiac Enzymes: ?No results for input(s): CKTOTAL, CKMB, CKMBINDEX, TROPONINI in the last 168 hours. ?BNP (last 3 results) ?No results for input(s): PROBNP in the last 8760 hours. ?HbA1C: ?No results for input(s): HGBA1C in the last 72 hours. ?  CBG: ?No results for input(s): GLUCAP in the last 168 hours. ?Lipid Profile: ?No results for input(s): CHOL, HDL, LDLCALC, TRIG, CHOLHDL, LDLDIRECT in the last 72 hours. ?Thyroid Function Tests: ?No results for input(s): TSH, T4TOTAL, FREET4, T3FREE, THYROIDAB in the last 72 hours. ?Anemia Panel: ?No results for input(s): VITAMINB12, FOLATE, FERRITIN, TIBC, IRON, RETICCTPCT in the last 72 hours. ?Sepsis Labs: ?No results for input(s): PROCALCITON, LATICACIDVEN in the last 168 hours. ? ?Recent Results (from the past 240 hour(s))  ?Urine Culture     Status: Abnormal  ? Collection Time: 10/12/21  3:46 AM  ? Specimen: In/Out Cath Urine  ?Result Value Ref Range Status  ? Specimen Description   Final  ?  IN/OUT CATH URINE ?Performed at [facility], [address], [place], [place] [address] ?  ? Special Requests   Final  ?  NONE ?Performed at [facility], [address] [place], [place] [address] ?  ? Culture 50,000 COLONIES/mL ESCHERICHIA COLI (A)  Final  ? Report Status 10/14/2021 FINAL  Final  ? Organism ID, Bacteria ESCHERICHIA COLI (A)  Final  ?    Susceptibility  ? Escherichia coli - MIC*  ?  AMPICILLIN <=2 SENSITIVE Sensitive   ?  CEFAZOLIN <=4 SENSITIVE Sensitive   ?  CEFEPIME <=0.12 SENSITIVE Sensitive   ?  CEFTRIAXONE <=0.25 SENSITIVE Sensitive    ?  CIPROFLOXACIN <=0.25 SENSITIVE Sensitive   ?  GENTAMICIN <=1 SENSITIVE Sensitive   ?  IMIPENEM <=0.25 SENSITIVE Sensitive   ?  NITROFURANTOIN <=16 SENSITIVE Sensitive   ?  TRIMETH/SULFA <=20 SENSITIVE S

## 2021-10-17 NOTE — Consult Note (Signed)
? ?                                                                                ?Consultation Note ?Date: 10/17/2021  ? ?Patient Name: Alyssa Pena  ?DOB: 1950/10/26  MRN: [id number]  Age / Sex: 71 y.o., female  ?PCP: Neale Burly, MD ?Referring Physician: Dessa Phi, DO ? ?Reason for Consultation: Establishing goals of care and Hospice Evaluation ? ?HPI/Patient Profile: 71 y.o. female  with past medical history of alcoholic hepatitis, CHF, cirrhosis, alcohol dependence, HTN, portal venous hypertension, anxiety/depression,  admitted on 10/11/2021 with hepatocellular carcinoma.  ? ?Clinical Assessment and Goals of Care: ?I have reviewed medical records including EPIC notes, labs and imaging, received report from RN, assessed the patient.  Alyssa Pena is sitting up in the bed in her room.  She greets me, making and mostly keeping eye contact.  She appears acutely/chronically ill and quite frail.  She is alert and oriented x3, able to make her basic needs known.  Her sister [name], is at bedside.  ? ?We meet at bedside to discuss diagnosis prognosis, GOC, EOL wishes, disposition and options.  I introduced Palliative Medicine as specialized medical care for people living with serious illness. It focuses on providing relief from the symptoms and stress of a serious illness. The goal is to improve quality of life for both the patient and the family. ? ?We discussed a brief life review of the patient.  Alyssa Pena is a widow, her husband died in 2016/09/20.  She raised 3 stepsons the youngest from age 31.  She had been living independently with ADLs/IADLs prior to this illness.  ? ?We then focused on their current illness.  We talk about what is likely hepatocellular carcinoma.  Patsy shares that she felt that Alyssa Pena looked improved yesterday, but her color is off today.  We talk about the temporary nature of IV fluids and hospital support.  We talked about what we can and cannot change.  We talked about the  natural disease trajectory and expectations at EOL were discussed. ? ?Advanced directives, concepts specific to code status, artifical feeding and hydration, and rehospitalization were considered and discussed.  DNR verified with patient and sister/healthcare surrogate.  We talk about the concept of do not rehospitalize, leaning on hospice care.  We talk about preferred place of death.  Initially Alyssa Pena is unable to understand, but [name] does, and helps Alyssa Pena understand the question.  [name] states her preferred place of death would be home.   ? ?Hospice Care services outpatient were explained and offered.  We talk about the benefits of hospice care, in particular about the benefits for symptom management and support.  We also talk about the benefits of residential hospice if/when needed.  I encourage patient and family to lean on hospice for any ongoing needs. ? ?Prognosis discussed with permission.  I share a diagram of the chronic illness pathway, what is normal and expected in the changes that we see in those with cancer.  I shared that Alyssa Pena functional status would be a good indicator for prognosis.  I encouraged them to lean on hospice.  I  shared that 3 months or less would be expected.  If Alyssa Pena worsen suddenly it may be weeks. ? ?Discussed the importance of continued conversation with family and the medical providers regarding overall plan of care and treatment options, ensuring decisions are within the context of the patient?s values and GOCs.  Questions and concerns were addressed.  The patient and family were encouraged to call with questions or concerns.  PMT will continue to support holistically. ? ?Conference with attending, bedside nursing staff, transition of care team related to patient condition, needs, goals of care, disposition with hospice of [facility] ? ? ?HCPOA ?NEXT OF KIN -Alyssa Pena is a widow since 2018.  She has no natural children but 3 stepsons.  She names  her sister, [name] as her healthcare surrogate ?  ? ?SUMMARY OF RECOMMENDATIONS   ?Home with the benefits of hospice of [facility]. ?We discussed the concept of do not rehospitalize. ? ?Code Status/Advance Care Planning: ?DNR ? ?Symptom Management:  ?Reviewed orders, added anxiety meds ? ?Palliative Prophylaxis:  ?Frequent Pain Assessment and Oral Care ? ?Additional Recommendations (Limitations, Scope, Preferences): ?At this point home with the benefits of hospice care.  Anticipate comfort care within 1 month. ? ?Psycho-social/Spiritual:  ?Desire for further Chaplaincy support:no ?Additional Recommendations: Caregiving  Support/Resources and Education on Hospice ? ?Prognosis:  ?< 3 months, could be weeks. ? ?Discharge Planning:  Home with the benefits of hospice of [facility]   ? ?  ? ?Primary Diagnoses: ?Present on Admission: ? AKI (acute kidney injury) ([place]) ? Cirrhosis ([place]) ? Benign essential HTN ? RUQ pain ? ? ?I have reviewed the medical record, interviewed the patient and family, and examined the patient. The following aspects are pertinent. ? ?Past Medical History:  ?Diagnosis Date  ? Alcohol dependence ([place]) 08/10/2013  ? Alcoholic hepatitis 1/94/1740  ? Anxiety   ? Arthritis   ? Bruises easily   ? CHF (congestive heart failure) ([place])   ? Cirrhosis ([place]) 08/10/2013  ? Depression   ? Dysrhythmia   ? before medication  ? Fluid retention   ? Full dentures   ? upper  ? Hypertension   ? Portal venous hypertension ([place]) 08/10/2013  ? Renal disorder   ? Thrombocytopenia ([place]) 08/10/2013  ? ?Social History  ? ?Socioeconomic History  ? Marital status: Widowed  ?  Spouse name: Not on file  ? Number of children: Not on file  ? Years of education: Not on file  ? Highest education level: Not on file  ?Occupational History  ? Not on file  ?Tobacco Use  ? Smoking status: Former  ?  Types: Cigarettes  ? Smokeless tobacco: Never  ?Vaping Use  ? Vaping Use: Never used  ?Substance and Sexual  Activity  ? Alcohol use: Not Currently  ? Drug use: Not Currently  ?  Comment: younger years  ? Sexual activity: Not Currently  ?  Birth control/protection: Surgical  ?Other Topics Concern  ? Not on file  ?Social History Narrative  ? Not on file  ? ?Social Determinants of Health  ? ?Financial Resource Strain: Not on file  ?Food Insecurity: Not on file  ?Transportation Needs: Not on file  ?Physical Activity: Not on file  ?Stress: Not on file  ?Social Connections: Not on file  ? ?Family History  ?Problem Relation Age of Onset  ? Lung cancer Father   ? Lung cancer Brother   ? COPD Brother   ? Dementia Mother   ? Arthritis  Sister   ? High blood pressure Sister   ? COPD Sister   ? ?Scheduled Meds: ? carvedilol  12.5 mg Oral BID  ? dicyclomine  10 mg Oral QAC breakfast  ? enoxaparin (LOVENOX) injection  40 mg Subcutaneous Q24H  ? FLUoxetine  40 mg Oral Daily  ? melatonin  6 mg Oral QHS  ? ?Continuous Infusions: ?PRN Meds:.acetaminophen **OR** acetaminophen, morphine injection, ondansetron **OR** ondansetron (ZOFRAN) IV, oxyCODONE ?Medications Prior to Admission:  ?Prior to Admission medications   ?Medication Sig Start Date End Date Taking? Authorizing Provider  ?albuterol (VENTOLIN HFA) 108 (90 Base) MCG/ACT inhaler Inhale 1-2 puffs into the lungs every 6 (six) hours as needed for wheezing or shortness of breath. 06/23/20  Yes Marney Setting, NP  ?benazepril (LOTENSIN) 5 MG tablet Take 5 mg by mouth daily. 09/04/21  Yes [provider]  ?carvedilol (COREG) 12.5 MG tablet Take 12.5 mg by mouth 2 (two) times daily. 04/21/18  Yes [provider]  ?dicyclomine (BENTYL) 10 MG capsule Take 10 mg by mouth daily. 09/04/21  Yes [provider]  ?FLUoxetine (PROZAC) 40 MG capsule Take 40 mg by mouth daily. 03/08/21  Yes [provider]  ?furosemide (LASIX) 20 MG tablet Take 20 mg by mouth daily. 05/06/19  Yes [provider]  ?omeprazole (PRILOSEC) 20 MG capsule Take 20 mg by mouth daily.    Yes [provider]  ?ondansetron (ZOFRAN) 4 MG tablet Take 4 mg by mouth every 4 (four) hours as needed. 09/16/21  Yes [provider]  ?ondansetron (ZOFRAN-ODT) 4 MG disintegrating tablet

## 2021-10-17 NOTE — TOC Progression Note (Signed)
Transition of Care (TOC) - Progression Note  ? ? ?Patient Details  ?Name: Alyssa Pena ?MRN: [id number] ?Date of Birth: 28-Apr-1951 ? ?Transition of Care (TOC) CM/SW Contact  ?Shade Flood, LCSW ?Phone Number: ?10/17/2021, 12:30 PM ? ?Clinical Narrative:    ? ?TOC following. Per Palliative APNP, pt would like dc home with hospice provided by [facility]. MD anticipating dc tomorrow. Referral made to Cassandra at [place] will follow. ? ?Expected Discharge Plan: [place] ?Barriers to Discharge: Continued Medical Work up ? ?Expected Discharge Plan and Services ?Expected Discharge Plan: [place] ?In-house Referral: Clinical Social Work ?  ?  ?Living arrangements for the past 2 months: [place] ?                ?  ?  ?  ?  ?  ?  ?  ?  ?  ?  ? ? ?Social Determinants of Health (SDOH) Interventions ?  ? ?Readmission Risk Interventions ? ?  10/17/2021  ? 12:29 PM 10/12/2021  ?  2:36 PM  ?Readmission Risk Prevention Plan  ?Transportation Screening  Complete  ?[place] or Home Care Consult  Complete  ?Social Work Consult for [place] Planning/Counseling  Complete  ?Palliative Care Screening  Not Applicable  ?Medication Review Press photographer)  Complete  ?[place] or Home Care Consult Complete   ?SW Recovery Care/Counseling Consult Complete   ?Palliative Care Screening Complete   ?[place] Not Applicable   ? ? ?

## 2021-10-18 MED ORDER — SENNOSIDES 8.6 MG PO TABS: 2.0000 | ORAL_TABLET | Freq: Two times a day (BID) | ORAL | 0 refills | Status: DC

## 2021-10-18 MED ORDER — LORAZEPAM 0.5 MG PO TABS: 0.5000 mg | ORAL_TABLET | ORAL | 0 refills | Status: DC | PRN

## 2021-10-18 MED ORDER — OXYCODONE HCL 5 MG PO TABS: 5.0000 mg | ORAL_TABLET | ORAL | 0 refills | Status: DC | PRN

## 2021-10-18 NOTE — Discharge Summary (Signed)
Physician Discharge Summary  ?Alyssa Pena IRJ:[id number] DOB: 11-Jun-1951 DOA: 10/11/2021 ? ?PCP: Neale Burly, MD ? ?Admit date: 10/11/2021 ?Discharge date: 10/18/2021 ? ?Admitted From: Home ?Disposition:  Home with hospice  ? ?Discharge Condition: Terminal, poor prognosis  ?CODE STATUS: DNR  ?Diet recommendation: Regular  ? ?Brief/Interim Summary: ?Alyssa Pena is a 71 y.o. female with medical history significant of alcohol dependence, alcoholic hepatitis, CHF, cirrhosis, depression, hypertension, portal venous hypertension, thrombocytopenia, and more who presented ED with a chief complaint of fever.  Patient recently saw GI clinic on April 25.  She was referred there for possible ERCP.  Patient has been experiencing intermittent epigastric pain radiating to her right upper quadrant and posteriorly for the last 1 month.  She also had nausea and vomiting.  An ultrasound was ordered on the end of March that showed hepatic cirrhosis and right hepatic cyst, thrombus in the portal vein thought to be chronic.  Also revealed a dilated common bile duct at 10 mm with cholelithiasis.   ? ?CT abdomen pelvis shows no bowel obstruction.  Cirrhosis.  Suspected cavernous transformation of portal vein with abdominal varices.  No ascites.  3.0 right adrenal nodule that could be metastasis and should be followed up with CT in 4 to 6 weeks.  No CBD ductal dilatation. ?  ?GI was consulted, felt that patient did not have cholangitis.  Antibiotics were discontinued.  Oncology was consulted due to concern for hepatocellular carcinoma with metastasis.  He did not feel that patient would benefit on chemotherapy.  He is recommended palliative care and hospice evaluation.  Patient met with palliative care medicine and was referred for home hospice on discharge.  Prognosis is poor. ? ?Discharge Diagnoses:  ? ?Principal Problem: ?  Hepatocellular carcinoma ([place]) ?Active Problems: ?  Cirrhosis ([place]) ?  AKI (acute kidney injury) ([place]) ?  Benign  essential HTN ?  RUQ pain ?  Elevated liver function tests ?  Hypoalbuminemia ?  Malnutrition of moderate degree ?  Diarrhea ?  Elevated AFP ?  Lesion of liver ?  UTI (urinary tract infection) ? ? ?Infiltrative HCC with tumor thrombus in portal vein, cirrhosis ?-Appreciate oncology and GI ?-Recommended for palliative care/hospice evaluation ?-Discharged with home hospice ?  ?Elevated LFTs ?-Cholangitis ruled out by GI ?-Completed 7-day treatment of ertapenem ?  ?AKI ?-Resolved ?  ?Hypertension ?-Coreg ?  ?E. coli UTI ?-Completed antibiotic treatment ? ? ?Discharge Instructions ? ?Discharge Instructions   ? ? Diet general   Complete by: As directed ?  ? Increase activity slowly   Complete by: As directed ?  ? ?  ? ?Allergies as of 10/18/2021   ? ?   Reactions  ? Penicillins Hives  ? Tape   ? Bruises her skin  ? ?  ? ?  ?Medication List  ?  ? ?STOP taking these medications   ? ?benazepril 5 MG tablet ?Commonly known as: LOTENSIN ?  ?furosemide 20 MG tablet ?Commonly known as: LASIX ?  ?ondansetron 4 MG tablet ?Commonly known as: ZOFRAN ?  ? ?  ? ?TAKE these medications   ? ?albuterol 108 (90 Base) MCG/ACT inhaler ?Commonly known as: VENTOLIN HFA ?Inhale 1-2 puffs into the lungs every 6 (six) hours as needed for wheezing or shortness of breath. ?  ?carvedilol 12.5 MG tablet ?Commonly known as: COREG ?Take 12.5 mg by mouth 2 (two) times daily. ?  ?dicyclomine 10 MG capsule ?Commonly known as: BENTYL ?Take 10 mg by mouth daily. ?  ?  FLUoxetine 40 MG capsule ?Commonly known as: PROZAC ?Take 40 mg by mouth daily. ?  ?LORazepam 0.5 MG tablet ?Commonly known as: ATIVAN ?Take 1 tablet (0.5 mg total) by mouth every 4 (four) hours as needed for anxiety. May crush, mix with water and give sublingually if needed. ?  ?Metamucil Smooth Texture 58.6 % powder ?Generic drug: psyllium ?Take 1 packet by mouth at bedtime. ?  ?omeprazole 20 MG capsule ?Commonly known as: PRILOSEC ?Take 20 mg by mouth daily. ?  ?ondansetron 4 MG  disintegrating tablet ?Commonly known as: ZOFRAN-ODT ?Take 4 mg by mouth every 4 (four) hours as needed for nausea or vomiting. ?  ?oxyCODONE 5 MG immediate release tablet ?Commonly known as: Oxy IR/ROXICODONE ?Take 1 tablet (5 mg total) by mouth every 4 (four) hours as needed for severe pain. May crush, mix with water and give sublingually if needed. ?  ?senna 8.6 MG tablet ?Commonly known as: Senokot ?Take 2 tablets (17.2 mg total) by mouth 2 (two) times daily. May crush, mix with water and give sublingually if needed. ?  ? ?  ? ? ?Allergies  ?Allergen Reactions  ? Penicillins Hives  ? Tape   ?  Bruises her skin  ? ? ?Consultations: ?GI ?Oncology ?Palliative care  ? ? ?Procedures/Studies: ?CT ABDOMEN PELVIS W WO CONTRAST ? ?Result Date: 10/15/2021 ?CLINICAL DATA:  Cirrhosis and portal vein thrombosis. Evaluate for possible HCC. EXAM: CT ABDOMEN AND PELVIS WITHOUT AND WITH CONTRAST TECHNIQUE: Multidetector CT imaging of the abdomen and pelvis was performed following the standard protocol before and following the bolus administration of intravenous contrast. RADIATION DOSE REDUCTION: This exam was performed according to the departmental dose-optimization program which includes automated exposure control, adjustment of the mA and/or kV according to patient size and/or use of iterative reconstruction technique. CONTRAST:  153m OMNIPAQUE IOHEXOL 300 MG/ML  SOLN COMPARISON:  MRI abdomen 10/12/2021 FINDINGS: Lower chest: Subpleural atelectasis at the right lung base but no worrisome pulmonary lesions. The heart is within normal limits in size. No pericardial effusion. Hepatobiliary: Advanced cirrhotic changes involving the liver with portal venous hypertension, portal venous collaterals and splenomegaly. Extensive main portal vein thrombosis. Reconstitution of the left portal vein with intrahepatic blood flow. The middle and right portal veins are completely occluded. Diffuse and fairly marked enhancement of the thrombus  and probable surrounding infiltrating tumor. There also numerous arterial phase enhancing lesions suggesting small HCC's or dysplastic nodules. Because of the thrombosed portal vein there is a very bizarre enhancement pattern in the liver. In segment 6 there is enhancement of the distended portal vein but also some enhancement and subsequent low-attenuation in the surrounding parenchyma. Findings likely infiltrative HCC. This liver is considered LI-RADS TIV. Pancreas: No mass, inflammation or ductal dilatation. Spleen: Splenomegaly with extensive perisplenic collaterals but no splenic lesions. Adrenals/Urinary Tract: 2 cm right adrenal gland lesion, not present in 2020 and demonstrating high attenuation, contrast enhancement and no significant washout suggesting a metastatic focus. The left adrenal gland is difficult to identify because of the surrounding collateral vessels but no obvious lesion. Bilateral renal calculi but no worrisome renal lesions. Stomach/Bowel: Stomach, duodenum, small bowel and colon are grossly normal. Vascular/Lymphatic: Atherosclerotic calcifications involving the aorta but no aneurysm dissection. No mesenteric or retroperitoneal mass or adenopathy. Reproductive: The uterus is surgically absent. Both ovaries are still present and appear normal. Other: Small amount of free pelvic fluid but no overt ascites. Musculoskeletal: No significant bony findings. IMPRESSION: 1. Advanced cirrhotic changes involving the liver with portal  venous hypertension, portal venous collaterals and splenomegaly. 2. Extensive main portal vein thrombosis extending into the middle and right portal veins which are markedly distended and demonstrate enhancement. Findings consistent with tumor thrombus. The left portal vein is reconstituted. 3. Numerous arterial phase enhancing lesions suggesting small HCC's or dysplastic nodules. 4. In segment 6 there is enhancement of the distended right portal vein but also some  enhancement and subsequent low-attenuation in the surrounding parenchyma. Findings likely infiltrative HCC. This liver is considered LI-RADS TIV (diffuse tumor thrombus). 5. 2 cm right adrenal gland lesion worrisome fo

## 2021-10-20 DIAGNOSIS — R69 Illness, unspecified: Secondary | ICD-10-CM | POA: Diagnosis not present

## 2021-10-21 LAB — CBC WITH DIFFERENTIAL/PLATELET
Absolute Monocytes: 815 cells/uL (ref 200–950)
Basophils Absolute: 40 cells/uL (ref 0–200)
Basophils Relative: 0.7 %
Eosinophils Absolute: 103 cells/uL (ref 15–500)
Eosinophils Relative: 1.8 %
HCT: 30.4 % — ABNORMAL LOW (ref 35.0–45.0)
Hemoglobin: 10.5 g/dL — ABNORMAL LOW (ref 11.7–15.5)
Lymphs Abs: 1533 cells/uL (ref 850–3900)
MCH: 32 pg (ref 27.0–33.0)
MCHC: 34.5 g/dL (ref 32.0–36.0)
MCV: 92.7 fL (ref 80.0–100.0)
MPV: 13 fL — ABNORMAL HIGH (ref 7.5–12.5)
Monocytes Relative: 14.3 %
Neutro Abs: 3209 cells/uL (ref 1500–7800)
Neutrophils Relative %: 56.3 %
Platelets: 114 10*3/uL — ABNORMAL LOW (ref 140–400)
RBC: 3.28 10*6/uL — ABNORMAL LOW (ref 3.80–5.10)
RDW: 12.9 % (ref 11.0–15.0)
Total Lymphocyte: 26.9 %
WBC: 5.7 10*3/uL (ref 3.8–10.8)

## 2021-10-21 LAB — COMPREHENSIVE METABOLIC PANEL
AG Ratio: 0.4 (calc) — ABNORMAL LOW (ref 1.0–2.5)
ALT: 42 U/L — ABNORMAL HIGH (ref 6–29)
AST: 91 U/L — ABNORMAL HIGH (ref 10–35)
Albumin: 2.1 g/dL — ABNORMAL LOW (ref 3.6–5.1)
Alkaline phosphatase (APISO): 172 U/L — ABNORMAL HIGH (ref 37–153)
BUN/Creatinine Ratio: 11 (calc) (ref 6–22)
BUN: 19 mg/dL (ref 7–25)
CO2: 23 mmol/L (ref 20–32)
Calcium: 8.3 mg/dL — ABNORMAL LOW (ref 8.6–10.4)
Chloride: 104 mmol/L (ref 98–110)
Creat: 1.7 mg/dL — ABNORMAL HIGH (ref 0.60–1.00)
Globulin: 4.9 g/dL (calc) — ABNORMAL HIGH (ref 1.9–3.7)
Glucose, Bld: 174 mg/dL — ABNORMAL HIGH (ref 65–99)
Potassium: 3.6 mmol/L (ref 3.5–5.3)
Sodium: 135 mmol/L (ref 135–146)
Total Bilirubin: 4.1 mg/dL — ABNORMAL HIGH (ref 0.2–1.2)
Total Protein: 7 g/dL (ref 6.1–8.1)

## 2021-10-21 LAB — HCV RNA, QUANT REAL-TIME PCR W/REFLEX
HCV RNA, PCR, QN (Log): 5.39 LogIU/mL — ABNORMAL HIGH
HCV RNA, PCR, QN: 246000 IU/mL — ABNORMAL HIGH

## 2021-10-21 LAB — HCV RNA NS5A DRUG RESISTANCE

## 2021-10-21 LAB — HCV RNA,LIPA RFLX NS5A DRUG RESIST

## 2021-10-21 LAB — PROTIME-INR
INR: 1.2 — ABNORMAL HIGH
Prothrombin Time: 12 s — ABNORMAL HIGH (ref 9.0–11.5)

## 2021-10-21 LAB — AFP TUMOR MARKER: AFP-Tumor Marker: 423.6 ng/mL — ABNORMAL HIGH

## 2021-10-29 ENCOUNTER — Ambulatory Visit (HOSPITAL_COMMUNITY): Payer: Medicare Other

## 2021-11-23 DIAGNOSIS — R5381 Other malaise: Secondary | ICD-10-CM | POA: Diagnosis not present

## 2021-11-23 DIAGNOSIS — T148XXA Other injury of unspecified body region, initial encounter: Secondary | ICD-10-CM | POA: Diagnosis not present

## 2021-11-23 DIAGNOSIS — W19XXXA Unspecified fall, initial encounter: Secondary | ICD-10-CM | POA: Diagnosis not present

## 2021-11-24 DIAGNOSIS — R69 Illness, unspecified: Secondary | ICD-10-CM | POA: Diagnosis not present

## 2021-11-24 DIAGNOSIS — R5381 Other malaise: Secondary | ICD-10-CM | POA: Diagnosis not present

## 2021-11-24 DIAGNOSIS — R17 Unspecified jaundice: Secondary | ICD-10-CM | POA: Diagnosis not present

## 2021-11-24 DIAGNOSIS — R404 Transient alteration of awareness: Secondary | ICD-10-CM | POA: Diagnosis not present

## 2021-11-24 DIAGNOSIS — Z743 Need for continuous supervision: Secondary | ICD-10-CM | POA: Diagnosis not present

## 2021-12-10 ENCOUNTER — Encounter (INDEPENDENT_AMBULATORY_CARE_PROVIDER_SITE_OTHER): Payer: Self-pay | Admitting: Gastroenterology

## 2021-12-10 ENCOUNTER — Ambulatory Visit (INDEPENDENT_AMBULATORY_CARE_PROVIDER_SITE_OTHER): Payer: Medicare Other | Admitting: Gastroenterology

## 2021-12-15 DEATH — deceased
